# Patient Record
Sex: Female | Born: 1960 | Race: Black or African American | Hispanic: No | Smoking: Former smoker
Health system: Southern US, Community
[De-identification: ages and names within clinical notes are randomized; demographics above are authoritative.]

## PROBLEM LIST (undated history)

## (undated) DIAGNOSIS — D649 Anemia, unspecified: Secondary | ICD-10-CM

## (undated) DIAGNOSIS — J45909 Unspecified asthma, uncomplicated: Secondary | ICD-10-CM

## (undated) DIAGNOSIS — I10 Essential (primary) hypertension: Secondary | ICD-10-CM

## (undated) HISTORY — DX: Essential (primary) hypertension: I10

## (undated) HISTORY — DX: Anemia, unspecified: D64.9

---

## 2002-07-31 ENCOUNTER — Other Ambulatory Visit: Admission: RE | Admit: 2002-07-31 | Discharge: 2002-07-31 | Payer: Self-pay | Admitting: Obstetrics and Gynecology

## 2002-08-01 ENCOUNTER — Other Ambulatory Visit: Admission: RE | Admit: 2002-08-01 | Discharge: 2002-08-01 | Payer: Self-pay | Admitting: Obstetrics and Gynecology

## 2002-10-07 ENCOUNTER — Inpatient Hospital Stay (HOSPITAL_COMMUNITY): Admission: RE | Admit: 2002-10-07 | Discharge: 2002-10-11 | Payer: Self-pay | Admitting: Obstetrics and Gynecology

## 2002-10-07 ENCOUNTER — Encounter (INDEPENDENT_AMBULATORY_CARE_PROVIDER_SITE_OTHER): Payer: Self-pay | Admitting: Specialist

## 2003-02-28 ENCOUNTER — Encounter (INDEPENDENT_AMBULATORY_CARE_PROVIDER_SITE_OTHER): Payer: Self-pay | Admitting: Internal Medicine

## 2003-02-28 LAB — CONVERTED CEMR LAB: Pap Smear: NORMAL

## 2006-01-09 ENCOUNTER — Ambulatory Visit: Payer: Self-pay | Admitting: Internal Medicine

## 2006-03-06 ENCOUNTER — Encounter (INDEPENDENT_AMBULATORY_CARE_PROVIDER_SITE_OTHER): Payer: Self-pay | Admitting: Internal Medicine

## 2006-03-06 DIAGNOSIS — I10 Essential (primary) hypertension: Secondary | ICD-10-CM | POA: Insufficient documentation

## 2007-06-07 ENCOUNTER — Ambulatory Visit: Payer: Self-pay | Admitting: Internal Medicine

## 2007-06-07 DIAGNOSIS — K5900 Constipation, unspecified: Secondary | ICD-10-CM | POA: Insufficient documentation

## 2007-06-07 DIAGNOSIS — D649 Anemia, unspecified: Secondary | ICD-10-CM | POA: Insufficient documentation

## 2007-06-07 DIAGNOSIS — J302 Other seasonal allergic rhinitis: Secondary | ICD-10-CM | POA: Insufficient documentation

## 2007-06-07 DIAGNOSIS — J309 Allergic rhinitis, unspecified: Secondary | ICD-10-CM

## 2007-07-08 ENCOUNTER — Ambulatory Visit: Payer: Self-pay | Admitting: Internal Medicine

## 2007-07-08 DIAGNOSIS — G43909 Migraine, unspecified, not intractable, without status migrainosus: Secondary | ICD-10-CM | POA: Insufficient documentation

## 2007-07-18 ENCOUNTER — Encounter (INDEPENDENT_AMBULATORY_CARE_PROVIDER_SITE_OTHER): Payer: Self-pay | Admitting: Internal Medicine

## 2009-05-17 ENCOUNTER — Ambulatory Visit: Payer: Self-pay | Admitting: Family Medicine

## 2009-05-17 DIAGNOSIS — Z780 Asymptomatic menopausal state: Secondary | ICD-10-CM | POA: Insufficient documentation

## 2009-05-17 DIAGNOSIS — J019 Acute sinusitis, unspecified: Secondary | ICD-10-CM | POA: Insufficient documentation

## 2009-09-15 ENCOUNTER — Encounter: Payer: Self-pay | Admitting: Physician Assistant

## 2009-11-09 ENCOUNTER — Encounter: Payer: Self-pay | Admitting: Physician Assistant

## 2009-11-09 ENCOUNTER — Telehealth: Payer: Self-pay | Admitting: Physician Assistant

## 2009-11-10 ENCOUNTER — Ambulatory Visit: Payer: Self-pay | Admitting: Family Medicine

## 2009-11-10 ENCOUNTER — Encounter: Payer: Self-pay | Admitting: Physician Assistant

## 2009-11-10 DIAGNOSIS — N920 Excessive and frequent menstruation with regular cycle: Secondary | ICD-10-CM | POA: Insufficient documentation

## 2009-11-11 ENCOUNTER — Encounter: Payer: Self-pay | Admitting: Physician Assistant

## 2009-11-12 ENCOUNTER — Encounter: Payer: Self-pay | Admitting: Physician Assistant

## 2009-11-15 ENCOUNTER — Telehealth: Payer: Self-pay | Admitting: Family Medicine

## 2009-11-22 LAB — CONVERTED CEMR LAB
HCT: 33.6 % — ABNORMAL LOW (ref 36.0–46.0)
Iron: 53 ug/dL (ref 42–145)
Platelets: 294 10*3/uL (ref 150–400)
RDW: 15.9 % — ABNORMAL HIGH (ref 11.5–15.5)
WBC: 5.4 10*3/uL (ref 4.0–10.5)

## 2009-11-29 ENCOUNTER — Ambulatory Visit: Payer: Self-pay | Admitting: Family Medicine

## 2010-01-10 ENCOUNTER — Other Ambulatory Visit: Admission: RE | Admit: 2010-01-10 | Discharge: 2010-01-10 | Payer: Self-pay | Admitting: Family Medicine

## 2010-01-10 ENCOUNTER — Ambulatory Visit: Payer: Self-pay | Admitting: Family Medicine

## 2010-01-10 DIAGNOSIS — R5383 Other fatigue: Secondary | ICD-10-CM | POA: Insufficient documentation

## 2010-01-10 DIAGNOSIS — R5381 Other malaise: Secondary | ICD-10-CM | POA: Insufficient documentation

## 2010-01-10 LAB — HM PAP SMEAR

## 2010-01-11 ENCOUNTER — Telehealth (INDEPENDENT_AMBULATORY_CARE_PROVIDER_SITE_OTHER): Payer: Self-pay | Admitting: *Deleted

## 2010-01-16 DIAGNOSIS — E669 Obesity, unspecified: Secondary | ICD-10-CM | POA: Insufficient documentation

## 2010-02-10 ENCOUNTER — Telehealth: Payer: Self-pay | Admitting: Family Medicine

## 2010-03-19 ENCOUNTER — Encounter: Payer: Self-pay | Admitting: Family Medicine

## 2010-03-20 ENCOUNTER — Encounter: Payer: Self-pay | Admitting: Family Medicine

## 2010-03-29 NOTE — Assessment & Plan Note (Signed)
Summary: office visit   Vital Signs:  Patient profile:   50 year old female Height:      65.25 inches Weight:      208.50 pounds BMI:     34.56 O2 Sat:      98 % on Room air Pulse rate:   82 / minute Pulse rhythm:   regular Resp:     16 per minute BP sitting:   124 / 82  (left arm)  Vitals Entered By: Mauricia Area CMA (January 10, 2010 11:30 AM)  Nutrition Counseling: Patient's BMI is greater than 25 and therefore counseled on weight management options.  O2 Flow:  Room air CC: physical  Vision Screening:Left eye w/o correction: 20 / 13 Right Eye w/o correction: 20 / 20 Both eyes w/o correction:  20/ 13        Vision Entered By: Mauricia Area CMA (January 10, 2010 11:31 AM)   CC:  physical.  History of Present Illness: Reports  that  she has been  doing well. Denies recent fever or chills. Denies sinus pressure, nasal congestion , ear pain or sore throat. Denies chest congestion, or cough productive of sputum. Denies chest pain, palpitations, PND, orthopnea or leg swelling. Denies abdominal pain, nausea, vomitting, diarrhea or constipation. Denies change in bowel movements or bloody stool. Denies dysuria , frequency, incontinence or hesitancy. Denies  joint pain, swelling, or reduced mobility. Denies headaches, vertigo, seizures. Denies depression, anxiety or insomnia. Denies  rash, lesions, or itch.     Allergies: 1)  Vicodin  Review of Systems      See HPI Eyes:  Denies blurring, discharge, double vision, eye pain, and red eye. Endo:  Denies cold intolerance, excessive urination, and heat intolerance. Heme:  Denies abnormal bruising and bleeding. Allergy:  Denies hives or rash and itching eyes.  Physical Exam  General:  Well-developed,well-nourished,obese,in no acute distress; alert,appropriate and cooperative throughout examination Head:  Normocephalic and atraumatic without obvious abnormalities. No apparent alopecia or balding. Eyes:  No  corneal or conjunctival inflammation noted. EOMI. Perrla. Funduscopic exam benign, without hemorrhages, exudates or papilledema. Vision grossly normal. Ears:  External ear exam shows no significant lesions or deformities.  Otoscopic examination reveals clear canals, tympanic membranes are intact bilaterally without bulging, retraction, inflammation or discharge. Hearing is grossly normal bilaterally. Nose:  External nasal examination shows no deformity or inflammation. Nasal mucosa are pink and moist without lesions or exudates. Mouth:  Oral mucosa and oropharynx without lesions or exudates.  Teeth in good repair. Neck:  No deformities, masses, or tenderness noted. Chest Wall:  No deformities, masses, or tenderness noted. Breasts:  No mass, nodules, thickening, tenderness, bulging, retraction, inflamation, nipple discharge or skin changes noted.   Lungs:  Normal respiratory effort, chest expands symmetrically. Lungs are clear to auscultation, no crackles or wheezes. Heart:  Normal rate and regular rhythm. S1 and S2 normal without gallop, murmur, click, rub or other extra sounds. Abdomen:  Bowel sounds positive,abdomen soft and non-tender without masses, organomegaly or hernias noted. Rectal:  No external abnormalities noted. Normal sphincter tone. No rectal masses or tenderness. Genitalia:  Normal introitus for age, no external lesions, no vaginal discharge, mucosa pink and moist, no vaginal or cervical lesions, no vaginal atrophy, no friaility or hemorrhage, normal uterus size and position, no adnexal masses or tenderness Msk:  No deformity or scoliosis noted of thoracic or lumbar spine.   Pulses:  R and L carotid,radial,femoral,dorsalis pedis and posterior tibial pulses are full and equal bilaterally Extremities:  No clubbing, cyanosis, edema, or deformity noted with normal full range of motion of all joints.   Neurologic:  No cranial nerve deficits noted. Station and gait are normal. Plantar  reflexes are down-going bilaterally. DTRs are symmetrical throughout. Sensory, motor and coordinative functions appear intact. Skin:  Intact without suspicious lesions or rashes Cervical Nodes:  No lymphadenopathy noted Axillary Nodes:  No palpable lymphadenopathy Inguinal Nodes:  No significant adenopathy Psych:  Cognition and judgment appear intact. Alert and cooperative with normal attention span and concentration. No apparent delusions, illusions, hallucinations   Impression & Recommendations:  Problem # 1:  HYPERTENSION (ICD-401.9) Assessment Unchanged  Her updated medication list for this problem includes:    Amlodipine Besylate 5 Mg Tabs (Amlodipine besylate) .Marland Kitchen... Take 1 daily for blood pressure  Orders: T-Basic Metabolic Panel [phone])  BP today: 124/82 Prior BP: 120/80 (11/29/2009)  Problem # 2:  OBESITY, UNSPECIFIED (ICD-278.00) Assessment: Deteriorated  Ht: 65.25 (01/10/2010)   Wt: 208.50 (01/10/2010)   BMI: 34.56 (01/10/2010)  Complete Medication List: 1)  Amlodipine Besylate 5 Mg Tabs (Amlodipine besylate) .... Take 1 daily for blood pressure 2)  Fluconazole 150 Mg Tabs (Fluconazole) .... Take 1 tablet by mouth once a day  Other Orders: T-Lipid Profile [phone]) T- Hemoglobin A1C (96295-28413) T-TSH [phone]) T-Vitamin B12 (36644-03474) Hemoccult Guaiac-1 spec.(in office) (82270) Pap Smear (25956) Gastroenterology Referral (GI)  Patient Instructions: 1)  Please schedule a follow-up appointment in 3 months. 2)  It is important that you exercise regularly at least 30 minutes 5 times a week. If you develop chest pain, have severe difficulty breathing, or feel very tired , stop exercising immediately and seek medical attention. 3)  You need to lose weight. Consider a lower calorie diet and regular exercise.  4)  BMP prior to visit, ICD-9: 5)  Lipid Panel prior to visit, ICD-9: 6)  TSH prior to visit, ICD-9:  fasting asap 7)  :, B12 8)  HbgA1C  prior to visit, ICD-9 9)  You will be referred for screening colonscopy Prescriptions: FLUCONAZOLE 150 MG TABS (FLUCONAZOLE) Take 1 tablet by mouth once a day  #1 x 0   Entered and Authorized by:   Syliva Overman MD   Signed by:   Syliva Overman MD on 01/12/2010   Method used:   Printed then faxed to ...         RxID:   [id number]    Orders Added: 1)  Est. Patient 40-64 years [99396] 2)  T-Basic Metabolic Panel [phone] 3)  T-Lipid Profile [80061-22930] 4)  T- Hemoglobin A1C [83036-23375] 5)  T-TSH [09323-55732] 6)  T-Vitamin B12 [82607-23330] 7)  Hemoccult Guaiac-1 spec.(in office) [82270] 8)  Pap Smear [88150] 9)  Gastroenterology Referral [GI]

## 2010-03-29 NOTE — Letter (Signed)
Summary: Letter  Letter   Imported By: Lind Guest 09/15/2009 14:40:10  _____________________________________________________________________  External Attachment:    Type:   Image     Comment:   External Document

## 2010-03-29 NOTE — Miscellaneous (Signed)
Summary: Orders Update  Clinical Lists Changes  Orders: Added new Test order of T-CBC No Diff ([id number]) - Signed Added new Test order of T-Iron [phone]) - Signed Added new Test order of T-Pregnancy (Serum), Qual.  [phone]) - Signed

## 2010-03-29 NOTE — Assessment & Plan Note (Signed)
Summary: NEW PATIENT- room 3   Vital Signs:  Patient profile:   50 year old female Height:      65.25 inches Weight:      200 pounds BMI:     33.15 O2 Sat:      98 % on Room air Pulse rate:   76 / minute Resp:     16 per minute BP sitting:   146 / 80  (left arm)  Vitals Entered By: Adella Hare LPN (May 17, 2009 8:24 AM) CC: new patient- room 3 Is Patient Diabetic? No Pain Assessment Patient in pain? no        CC:  new patient- room 3.  History of Present Illness: New pt here today to establish with new PCP.  menses irrg since nov hot flashes & night sweats x approx 1 yr last pap/pelvic couple yrs ago.  hx migraines , no change in syptoms avg 2/mos usually around menses takes 800 mg of ibuprofen as needed & works pretty well.  doesn't like prescription meds.  sinus congestoin x 2 wks discolored mucus, some blood no cough no fever   Current Medications (verified): 1)  None  Allergies (verified): 1)  Vicodin  Past History:  Past medical, surgical, family and social histories (including risk factors) reviewed for relevance to current acute and chronic problems.  Past Medical History: Reviewed history from 06/07/2007 and no changes required. Hypertension constipation Anemia-NOS Allergic rhinitis  Past Surgical History: Reviewed history from 06/07/2007 and no changes required. Caesarean section x2  Family History: Reviewed history from 06/07/2007 and no changes required. father-75 mother-70-HTN sister-40-HTN brother-39 sister-38 son-15-eczema,allergies son-4-asthma  Social History: Reviewed history from 06/07/2007 and no changes required. divorced Former Smoker-2ppd x3 years, quit 1982 Alcohol use-no Drug use-no  Review of Systems General:  Denies chills and fever. ENT:  Complains of nasal congestion, postnasal drainage, and sinus pressure; denies earache and sore throat. CV:  Denies chest pain or discomfort. Resp:  Denies cough and  shortness of breath. GU:  Complains of abnormal vaginal bleeding.  Physical Exam  General:  Well-developed,well-nourished,in no acute distress; alert,appropriate and cooperative throughout examination Head:  Normocephalic and atraumatic without obvious abnormalities. No apparent alopecia or balding. Ears:  External ear exam shows no significant lesions or deformities.  Otoscopic examination reveals clear canals, tympanic membranes are intact bilaterally without bulging, retraction, inflammation or discharge. Hearing is grossly normal bilaterally. Nose:  no external deformity, mucosal erythema, mucosal edema, L maxillary sinus tenderness, and R maxillary sinus tenderness.   Mouth:  Oral mucosa and oropharynx without lesions or exudates.  Neck:  No deformities, masses, or tenderness noted. Lungs:  Normal respiratory effort, chest expands symmetrically. Lungs are clear to auscultation, no crackles or wheezes. Heart:  Normal rate and regular rhythm. S1 and S2 normal without gallop, murmur, click, rub or other extra sounds. Cervical Nodes:  No lymphadenopathy noted Psych:  Cognition and judgment appear intact. Alert and cooperative with normal attention span and concentration. No apparent delusions, illusions, hallucinations   Impression & Recommendations:  Problem # 1:  SINUSITIS, ACUTE (ICD-461.9) Assessment New  The following medications were removed from the medication list:    Nasonex 50 Mcg/act Susp (Mometasone furoate) .Marland Kitchen... 2 sprays in each nostril once daily Her updated medication list for this problem includes:    Amoxicillin 875 Mg Tabs (Amoxicillin) .Marland Kitchen... Take 1 two times a day x 10 days  Problem # 2:  PERIMENOPAUSAL STATUS (ICD-V49.81) Assessment: New Will return for pap/pelvic exam Orders: T-FSH (  [id number])  Problem # 3:  MIGRAINE HEADACHE (ICD-346.90) Assessment: Unchanged pt states her migraines improved when she was on amlidipine prev. pt only like to use ibuprofen  for migraines, takes 800 mg as needed   Problem # 4:  HYPERTENSION (ICD-401.9) Will restart meds.  Pt states worked well for her prev. Her updated medication list for this problem includes:    Amlodipine Besylate 5 Mg Tabs (Amlodipine besylate) .Marland Kitchen... Take 1 daily for blood pressure  Orders: T-Comprehensive Metabolic Panel ([id number])  BP today: 146/80 Prior BP: 136/96 (07/08/2007)  Complete Medication List: 1)  Amoxicillin 875 Mg Tabs (Amoxicillin) .... Take 1 two times a day x 10 days 2)  Amlodipine Besylate 5 Mg Tabs (Amlodipine besylate) .... Take 1 daily for blood pressure  Other Orders: T-Lipid Profile (32440-10272) T-CBC No Diff (53664-40347) T-TSH [phone]) T-Iron [phone]) T-Iron Binding Capacity (TIBC) ([address]) T-Vitamin D (25-Hydroxy) (60109-32355) Mammogram (Screening) (Mammo)  Patient Instructions: 1)  Please schedule a follow-up appointment in 1 month for physical/pap. 2)  You may try Black Cohash (Remifemin) for hot flashes. 3)  I have refilled your blood pressure pill & prescribed an antibiotic for you. 4)  It is important that you exercise regularly at least 20 minutes 5 times a week. If you develop chest pain, have severe difficulty breathing, or feel very tired , stop exercising immediately and seek medical attention. 5)  You need to lose weight. Consider a lower calorie diet and regular exercise.  6)  Get plenty of rest, drink lots of clear liquids, and use Tylenol or Ibuprofen for fever and comfort. Return in 7-10 days if you're not better:sooner if you're feeling worse. Prescriptions: AMOXICILLIN 875 MG TABS (AMOXICILLIN) take 1 two times a day x 10 days  #20 x 0   Entered by:   Everitt Amber LPN   Authorized by:   Esperanza Sheets PA   Signed by:   Everitt Amber LPN on 73/22/0254   Method used:   Electronically to        [facility] (retail)       [address]       [place], [place]  [address]       Ph: [phone]        Fax: [phone]   RxID:   [id number] AMLODIPINE BESYLATE 5 MG TABS (AMLODIPINE BESYLATE) take 1 daily for blood pressure  #90 x 3   Entered and Authorized by:   Esperanza Sheets PA   Signed by:   Esperanza Sheets PA on 05/17/2009   Method used:   Electronically to        [facility] (retail)       [address]       [place], [place]  [address]       Ph: [phone]       Fax: [phone]   RxID:   [id number] AMOXICILLIN 875 MG TABS (AMOXICILLIN) take 1 two times a day x 10 days  #20 x 0   Entered and Authorized by:   Esperanza Sheets PA   Signed by:   Esperanza Sheets PA on 05/17/2009   Method used:   Electronically to        [facility] [address] (retail)       [address]       [place],  [place]  [address]       Ph: [phone] or [phone]       Fax: [phone]   RxID:   [id number]

## 2010-03-29 NOTE — Progress Notes (Signed)
Summary: CALL  Phone Note Call from Patient   Summary of Call: CALL BACK AT [phone] WANTS RESULTS LEFT MESSAGE Initial call taken by: Lind Guest,  November 15, 2009 9:50 AM  Follow-up for Phone Call        returned call, no answer Follow-up by: Adella Hare LPN,  November 15, 2009 10:10 AM  Additional Follow-up for Phone Call Additional follow up Details #1::        returned call, no answer Additional Follow-up by: Adella Hare LPN,  November 16, 2009 8:37 AM

## 2010-03-29 NOTE — Assessment & Plan Note (Signed)
Summary: follow up- room 1   Vital Signs:  Patient profile:   50 year old female Height:      65.25 inches Weight:      204.75 pounds BMI:     33.93 O2 Sat:      100 % on Room air Pulse rate:   63 / minute Resp:     16 per minute BP sitting:   120 / 80  (left arm)  Vitals Entered By: Adella Hare LPN (November 29, 2009 1:17 PM) CC: follow-up visit Is Patient Diabetic? No Pain Assessment Patient in pain? no      Comments declined physical due to menstral   CC:  follow-up visit.  History of Present Illness: Pt presents today for check up. Is feeling well and no complaints. Started normal mense yesterday. Last mos had a heavy period and took OCPs to control the bleeding and worked well.  Has started taking Fe for her anemia.   Hx of htn. Taking medications as prescribed and denies side effects.  No headache, chest pain or palpitations..  No prev mamm.  Will reschedule PE/pap. Last Td  uncertain. Will get flu vac at work.    Current Medications (verified): 1)  Amlodipine Besylate 5 Mg Tabs (Amlodipine Besylate) .... Take 1 Daily For Blood Pressure 2)  Ortho-Cyclen (28) 0.25-35 Mg-Mcg Tabs (Norgestimate-Eth Estradiol) .... Take As Directed  Allergies (verified): 1)  Vicodin  Past History:  Past medical history reviewed for relevance to current acute and chronic problems.  Past Medical History: Reviewed history from 06/07/2007 and no changes required. Hypertension constipation Anemia-NOS Allergic rhinitis  Review of Systems General:  Denies chills and fever. ENT:  Complains of postnasal drainage; denies earache, sinus pressure, and sore throat. CV:  Denies chest pain or discomfort, lightheadness, and palpitations. Resp:  Denies cough and shortness of breath. GU:  Denies abnormal vaginal bleeding. Neuro:  Denies headaches. Allergy:  Complains of seasonal allergies.  Physical Exam  General:  Well-developed,well-nourished,in no acute distress;  alert,appropriate and cooperative throughout examination Head:  Normocephalic and atraumatic without obvious abnormalities. No apparent alopecia or balding. Ears:  External ear exam shows no significant lesions or deformities.  Otoscopic examination reveals clear canals, tympanic membranes are intact bilaterally without bulging, retraction, inflammation or discharge. Hearing is grossly normal bilaterally. Nose:  External nasal examination shows no deformity or inflammation. Nasal mucosa are pink and moist without lesions or exudates. Mouth:  Oral mucosa and oropharynx without lesions or exudates.   Neck:  No deformities, masses, or tenderness noted. Lungs:  Normal respiratory effort, chest expands symmetrically. Lungs are clear to auscultation, no crackles or wheezes. Heart:  Normal rate and regular rhythm. S1 and S2 normal without gallop, murmur, click, rub or other extra sounds. Cervical Nodes:  No lymphadenopathy noted Psych:  Cognition and judgment appear intact. Alert and cooperative with normal attention span and concentration. No apparent delusions, illusions, hallucinations   Impression & Recommendations:  Problem # 1:  HYPERTENSION (ICD-401.9) Assessment Comment Only  Her updated medication list for this problem includes:    Amlodipine Besylate 5 Mg Tabs (Amlodipine besylate) .Marland Kitchen... Take 1 daily for blood pressure  Orders: T-Comprehensive Metabolic Panel ([id number])  BP today: 120/80 Prior BP: 122/90 (11/10/2009)  Problem # 2:  MENORRHAGIA (ICD-626.2) Assessment: Improved  The following medications were removed from the medication list:    Ortho-cyclen (28) 0.25-35 Mg-mcg Tabs (Norgestimate-eth estradiol) .Marland Kitchen... Take as directed  Orders: T-TSH (25956-38756) T-CBC No Diff ([id number]) T-Iron (16606-30160)  Problem #  3:  ALLERGIC RHINITIS (ICD-477.9) Assessment: Unchanged Pt declined prescription.  Problem # 4:  ANEMIA (ICD-285.9) Assessment: Comment Only  Complete  Medication List: 1)  Amlodipine Besylate 5 Mg Tabs (Amlodipine besylate) .... Take 1 daily for blood pressure  Other Orders: T-Lipid Profile ([id number]) Mammogram (Screening) (Mammo) Tdap => 69yrs IM (30865) Admin 1st Vaccine (78469)  Patient Instructions: 1)  Schedule Physcial/Pap appt in 6 weeks. 2)  I have ordered a mammogram and blood work for you. 3)  You have received a tetnus vaccine today. 4)  Continue your current medications. 5)  It is important that you exercise regularly at least 20 minutes 5 times a week. If you develop chest pain, have severe difficulty breathing, or feel very tired , stop exercising immediately and seek medical attention. 6)  You need to lose weight. Consider a lower calorie diet and regular exercise.    Immunizations Administered:  Tetanus Vaccine:    Vaccine Type: Tdap    Site: left deltoid    Mfr: novartis    Dose: 0.5 ml    Route: IM    Given by: Adella Hare LPN    Exp. Date: 12/17/2011    Lot #: [id number]    VIS given: 01/15/08 version given November 29, 2009.

## 2010-03-29 NOTE — Progress Notes (Signed)
Summary: please advise  Phone Note Call from Patient   Summary of Call: Patient called in and states she knows she is over due for her physical, she said that she states she missed 4 months of her period but she started having a period on the 29 of August, and she is still bleeding she states it is a steady blood flow, and that she feels weak.  She wants to know if this is normal and she does plan on making an appointment for her physical.  Please advise you can leave a message and she will get back to you,  [phone]. Initial call taken by: Curtis Sites,  November 09, 2009 11:33 AM  Follow-up for Phone Call        She needs an appt for evaluation today or tomorrow (not a physical, that can be done later).  She needs labs drawn today. Follow-up by: Esperanza Sheets PA,  November 09, 2009 11:41 AM  Additional Follow-up for Phone Call Additional follow up Details #1::        I left a msg for patient to return my call. Additional Follow-up by: Curtis Sites,  November 09, 2009 2:25 PM    Additional Follow-up for Phone Call Additional follow up Details #2::    called patient, left message Follow-up by: Adella Hare LPN,  November 10, 2009 9:53 AM  Additional Follow-up for Phone Call Additional follow up Details #3:: Details for Additional Follow-up Action Taken: PATIENT COMING IN ODAY AT 3:00  Additional Follow-up by: Lind Guest,  November 10, 2009 11:38 AM

## 2010-03-29 NOTE — Progress Notes (Signed)
Summary: Dr  Patty Sermons appt.  Phone Note Outgoing Call   Summary of Call: patient has an appt. with Dr. Karilyn Cota on Dec. 1, 2011, they have called the patient to let them aware of appt.  Initial call taken by: Curtis Sites,  January 11, 2010 4:46 PM

## 2010-03-29 NOTE — Assessment & Plan Note (Signed)
Summary: irregular bleeding- room 2   Vital Signs:  Patient profile:   50 year old female Height:      65.25 inches Weight:      206.25 pounds BMI:     34.18 O2 Sat:      100 % on Room air Pulse rate:   76 / minute Resp:     16 per minute BP sitting:   122 / 90  (left arm)  Vitals Entered By: Adella Hare LPN (November 10, 2009 3:13 PM) CC: irregular bleeding   CC:  irregular bleeding.  History of Present Illness: Pt states she started a menstrual period Aug 29th.  Had been 4 mos since LMP.  Menses prior to this regular.  c/o heavy flow, changing protection 6-7 times a day.  1 or 2 blood clots only during this time.  No pelvic pain.  + hot flashes x 3 mos. Hx of tubal ligation. c/o feeling tired and weak.  Hx of anemia.  Takes Multivit with Fe daily.  Hx of htn. Taking medications as prescribed and denies side effects.  No headache, chest pain or palpitations.     Allergies (verified): 1)  Vicodin  Past History:  Past medical history reviewed for relevance to current acute and chronic problems.  Past Medical History: Reviewed history from 06/07/2007 and no changes required. Hypertension constipation Anemia-NOS Allergic rhinitis  Review of Systems General:  Complains of fatigue; denies chills and fever. CV:  Denies chest pain or discomfort and palpitations. Resp:  Denies shortness of breath. GI:  Denies abdominal pain, nausea, and vomiting. GU:  Complains of abnormal vaginal bleeding; denies dysuria and urinary frequency.  Physical Exam  General:  Well-developed,well-nourished,in no acute distress; alert,appropriate and cooperative throughout examination Head:  Normocephalic and atraumatic without obvious abnormalities. No apparent alopecia or balding. Eyes:  conjunctiva pale Ears:  External ear exam shows no significant lesions or deformities.  Otoscopic examination reveals clear canals, tympanic membranes are intact bilaterally without bulging, retraction,  inflammation or discharge. Hearing is grossly normal bilaterally. Nose:  External nasal examination shows no deformity or inflammation. Nasal mucosa are pink and moist without lesions or exudates. Mouth:  Oral mucosa and oropharynx without lesions or exudates.   Neck:  No deformities, masses, or tenderness noted.no thyromegaly and no thyroid nodules or tenderness.   Lungs:  Normal respiratory effort, chest expands symmetrically. Lungs are clear to auscultation, no crackles or wheezes. Heart:  Normal rate and regular rhythm. S1 and S2 normal without gallop, murmur, click, rub or other extra sounds. Abdomen:  Bowel sounds positive,abdomen soft and non-tender without masses, organomegaly or hernias noted. Cervical Nodes:  No lymphadenopathy noted Psych:  Cognition and judgment appear intact. Alert and cooperative with normal attention span and concentration. No apparent delusions, illusions, hallucinations   Impression & Recommendations:  Problem # 1:  MENORRHAGIA (ICD-626.2) Assessment New  Her updated medication list for this problem includes:    Ortho-cyclen (28) 0.25-35 Mg-mcg Tabs (Norgestimate-eth estradiol) .Marland Kitchen... Take as directed  Orders: Pelvic Ultrasound (Sono Pelvis)  Problem # 2:  PERIMENOPAUSAL STATUS (ICD-V49.81) Assessment: Comment Only  Problem # 3:  HYPERTENSION (ICD-401.9) Assessment: Comment Only  Her updated medication list for this problem includes:    Amlodipine Besylate 5 Mg Tabs (Amlodipine besylate) .Marland Kitchen... Take 1 daily for blood pressure  Problem # 4:  ANEMIA-NOS (ICD-285.9) Assessment: Comment Only  Complete Medication List: 1)  Amlodipine Besylate 5 Mg Tabs (Amlodipine besylate) .... Take 1 daily for blood pressure 2)  Ortho-cyclen (  28) 0.25-35 Mg-mcg Tabs (Norgestimate-eth estradiol) .... Take as directed  Patient Instructions: 1)  Please schedule a follow-up appointment in 2 weeks. 2)  I have ordered a pelvic ultrasound. 3)  I have prescribed birth  control pill to stop your bleeding.  Take 3 tabs a day x 3 days, then 2 tabs a day x 3 days, then 1 daily thereafter. 4)  Have labs drawn today. Prescriptions: ORTHO-CYCLEN (28) 0.25-35 MG-MCG TABS (NORGESTIMATE-ETH ESTRADIOL) take as directed  #28d x 0   Entered and Authorized by:   Esperanza Sheets PA   Signed by:   Esperanza Sheets PA on 11/10/2009   Method used:   Electronically to        [facility] (retail)       [address]       [place], [place]  [address]       Ph: [phone]       Fax: [phone]   RxID:   [id number]

## 2010-03-29 NOTE — Letter (Signed)
Summary: Letter  Letter   Imported By: Lind Guest 11/12/2009 13:53:17  _____________________________________________________________________  External Attachment:    Type:   Image     Comment:   External Document

## 2010-03-29 NOTE — Letter (Signed)
Summary: Work Excuse  [facility]  [address]   [place], [place] [address]   Phone: [phone]  Fax: [phone]    Today's Date: November 10, 2009  Name of Patient: Laurie Olson  The above named patient had a medical visit today.  Please take this into consideration when reviewing the time away from work/school.    Special Instructions:  [ * ] None  [  ] To be off the remainder of today, returning to the normal work / school schedule tomorrow.  [  ] To be off until the next scheduled appointment on ______________________.  [  ] Other ________________________________________________________________ ________________________________________________________________________   Sincerely yours,   Esperanza Sheets, PA-C

## 2010-03-31 NOTE — Progress Notes (Signed)
Summary: BIRTH CONTROL  Phone Note Call from Patient   Summary of Call: HAVING PROBLEM WITH CYCLE WANTED TO KNOW COULD YOU CALL IN SAME BIRTH CONTROL PILLS AS BEFORE...PLEASE CALL PATIENT BACK ON CELL [phone] Initial call taken by: Eugenio Hoes,  February 10, 2010 8:50 AM  Follow-up for Phone Call        pls stamp and fax scriopt and let her know med sent in , will need ov in 5 months if none to discuss menstrual probs and need for continued oCP Follow-up by: Syliva Overman MD,  February 10, 2010 11:49 AM  Additional Follow-up for Phone Call Additional follow up Details #1::        Phone Call Completed, Rx Called In Additional Follow-up by: Adella Hare LPN,  February 11, 2010 4:39 PM    New/Updated Medications: ORTHO-GYNOL 1 % GEL (OCTOXYNOL)  ORTHO-CYCLEN (28) 0.25-35 MG-MCG TABS (NORGESTIMATE-ETH ESTRADIOL) Use as directed Prescriptions: ORTHO-CYCLEN (28) 0.25-35 MG-MCG TABS (NORGESTIMATE-ETH ESTRADIOL) Use as directed  #1 x 4   Entered and Authorized by:   Syliva Overman MD   Signed by:   Syliva Overman MD on 02/10/2010   Method used:   Printed then faxed to ...       [facility] (retail)       [address]       [place], [place]  [address]       Ph: [phone]       Fax: [phone]   RxID:   [id number]

## 2010-04-06 ENCOUNTER — Encounter: Payer: Self-pay | Admitting: Family Medicine

## 2010-04-13 ENCOUNTER — Ambulatory Visit: Payer: Self-pay | Admitting: Family Medicine

## 2010-04-14 NOTE — Letter (Signed)
Summary: TO RESCHEDULE APPOINMENT  TO RESCHEDULE APPOINMENT   Imported By: Lind Guest 04/06/2010 11:10:33  _____________________________________________________________________  External Attachment:    Type:   Image     Comment:   External Document

## 2010-05-09 ENCOUNTER — Ambulatory Visit: Payer: Self-pay | Admitting: Family Medicine

## 2010-05-10 ENCOUNTER — Encounter: Payer: Self-pay | Admitting: Family Medicine

## 2010-05-17 NOTE — Letter (Signed)
Summary: 1st no show letter  1st no show letter   Imported By: Lind Guest 05/10/2010 09:12:01  _____________________________________________________________________  External Attachment:    Type:   Image     Comment:   External Document

## 2010-06-13 ENCOUNTER — Other Ambulatory Visit: Payer: Self-pay | Admitting: *Deleted

## 2010-06-13 MED ORDER — AMLODIPINE BESYLATE 5 MG PO TABS: 5.0000 mg | ORAL_TABLET | Freq: Every day | ORAL | Status: DC

## 2010-07-15 NOTE — Op Note (Signed)
NAME:  Laurie Olson, Laurie Olson                          ACCOUNT NO.:  [id number]   MEDICAL RECORD NO.:  [id number]                   PATIENT TYPE:  INP   LOCATION:  9399                                 FACILITY:  [facility]   PHYSICIAN:  Carrington Clamp, M.D.              DATE OF BIRTH:  1961/01/05   DATE OF PROCEDURE:  10/07/2002  DATE OF DISCHARGE:                                 OPERATIVE REPORT   PREOPERATIVE DIAGNOSES:  1. Term pregnancy with chronic hypertension and previous cesarean section,     desires repeat.  2. Multiparous, desires permanent sterility.   POSTOPERATIVE DIAGNOSES:  1. Term pregnancy with chronic hypertension and previous cesarean section,     desires repeat.  2. Multiparous, desires permanent sterility.   PROCEDURES:  1. Low transverse cesarean section.  2. Bilateral tubal ligation.   ATTENDING:  Carrington Clamp, M.D.   ASSISTANT:  Luvenia Redden, M.D.   ANESTHESIA:  Spinal.   ESTIMATED BLOOD LOSS:  600 mL.   FLUIDS REPLACED:  3000 mL.   URINE OUTPUT:  350 mL.   COMPLICATIONS:  None.   FINDINGS:  A female infant in the vertex presentation, Apgars 9 and 9.  Normal  tubes, ovaries, and uterus were seen.   MEDICATIONS:  Cefotan and Pitocin.   PATHOLOGY:  None.   COUNTS:  Correct x3.   TECHNIQUE:  After adequate spinal anesthesia was achieved, the patient was  prepped and draped in the usual sterile fashion in the dorsal supine  position with leftward tilt.  A Pfannenstiel skin incision was made with a  scalpel, carried down to the fascia with the Bovie cautery.  The fascia was  incised in the midline with a scalpel and carried in a transverse  curvilinear manner with the Mayo scissors.  The fascia was reflected from  the rectus muscles superiorly and inferiorly with blunt and sharp dissection  and the rectus muscles split in the midline with the Mayo scissors.  A bowel-  free portion of the peritoneum was entered into upon carefully dissecting  out  the rectus muscles.  The peritoneum was then incised in a superior and  inferior manner with good visualization of the bowel and bladder.   The bladder blade was placed and the vesicouterine fascia was tented up and  incised in a transverse curvilinear manner.  Blunt dissection was used to  create the bladder flap, and the bladder blade was replaced.  A 2 cm  transverse incision was made in the upper portion of the lower uterine  segment until the amnion was noted.  The incision was then extended in a  transverse curvilinear manner with the bandage scissors and the amnion  ruptured with the Allis.  Clear fluid was noted and the baby was delivered  without complication.  There was a body cord.  The baby was bulb-suctioned  and the cord was clamped and cut and the  baby was handed to awaiting  pediatrics.   The placenta was then manually extracted and the uterus exteriorized,  wrapped in a wet lap, and cleared of all debris.  The uterine incision was  then closed with a running locked stitch of 0 Monocryl.  One figure-of-eight  stitch was used to ensure hemostasis.  Attention was then turned to the  tubes.  Each was tented up with a Babcock and the avascular portion of the  mesosalpinx entered into with the Bovie cautery.  Two free-hand ties of  plain gut were placed through this and then tied down to create an  intervening knuckle of approximately 2 cm.  A segment of approximately 1.5  cm long was then excised bilaterally with the Metzenbaum scissors.  Hemostasis was achieved in all four incision sites.   The uterine incision was reinspected and found to be hemostatic.  The uterus  was replaced in the abdomen and the gutters cleared of all debris with  irrigation.  The uterine incision was reinspected and found to be  hemostatic, as was the left tubal ligation site, which had been introduced  into the abdomen first.  The peritoneum was then closed with a running  suture of 2-0 Vicryl.   The fascia was closed with a running stitch of 0  Vicryl.  The subcutaneous tissue was rendered hemostatic with the Bovie  cautery and irrigation and then closed with interrupted 2-0 plain gut  stitches.  The skin was closed with staples.  The patient tolerated the  procedure well, was returned to the recovery room in stable condition.                                               Carrington Clamp, M.D.    MH/MEDQ  D:  10/07/2002  T:  10/07/2002  Job:  [id number]

## 2010-07-15 NOTE — Discharge Summary (Signed)
   NAME:  Laurie Olson, Laurie Olson                          ACCOUNT NO.:  [id number]   MEDICAL RECORD NO.:  [id number]                   PATIENT TYPE:  INP   LOCATION:  9324                                 FACILITY:  [facility]   PHYSICIAN:  Miguel Aschoff, M.D.                    DATE OF BIRTH:  06/07/1960   DATE OF ADMISSION:  10/07/2002  DATE OF DISCHARGE:  10/11/2002                                 DISCHARGE SUMMARY   ADMISSION DIAGNOSES:  1. Intrauterine pregnancy at term.  2. Previous cesarean section.  3. Chronic hypertension.  4. Desire for a repeat cesarean section.  5. Desire for sterilization.   POST-OP DIAGNOSES:  1. Intrauterine pregnancy at term.  2. Previous cesarean section.  3. Chronic hypertension.  4. Desire for a repeat cesarean section.  5. Desire for sterilization.  6. Delivery of viable female infant, Apgar's 9, 9.   OPERATIONS/PROCEDURES:  1. Repeat low flap transverse cesarean section.  2. Bilateral tubal sterilization.   BRIEF HISTORY:  The patient is a 49 year old black female, gravida 2, para  1, status post prior cesarean section for failure to progress.  The patient  had her prenatal care at [facility] and has desire to undergo a  repeat cesarean section rather than attempt a VBAC.  In addition, the  patient has requested that a sterilization procedure be performed at the  time of the C-section.   HOSPITAL COURSE:  Preoperative studies were  obtained.  Her admission  hemoglobin was 10.4, hematocrit 30.7, white count 8,000.   Under spinal anesthesia, repeat low flap transverse cesarean section was  carried out without difficulty as well as bilateral tubal sterilization.  Path report confirms the presence of two segments of tube.   The patient's postoperative course was essentially uncomplicated.  She  tolerated increasing ambulation and diet well.  By October 11, 2002, she was  able to be sent home in satisfactory condition.  Hemoglobin on discharge was  9.2.   DISCHARGE MEDICATIONS:  1. Tylox, one every three hours as needed for pain.  2. Chromagen one daily.   She was instructed no heavy lifting, place nothing in the vagina and to call  if there are any problems such as fever, pain or heavy bleeding.    CONDITION ON DISCHARGE:  Improved.   The baby did have to spend some time in neonatal ICU because of respiratory  difficulties.                                               Miguel Aschoff, M.D.    AR/MEDQ  D:  11/04/2002  T:  11/04/2002  Job:  [id number]

## 2010-08-22 ENCOUNTER — Ambulatory Visit (INDEPENDENT_AMBULATORY_CARE_PROVIDER_SITE_OTHER): Payer: BC Managed Care – PPO | Admitting: Family Medicine

## 2010-08-22 ENCOUNTER — Encounter: Payer: Self-pay | Admitting: Family Medicine

## 2010-08-22 ENCOUNTER — Encounter: Payer: Self-pay | Admitting: *Deleted

## 2010-08-22 VITALS — BP 160/110 | HR 72 | Resp 16 | Ht 66.0 in | Wt 211.1 lb

## 2010-08-22 DIAGNOSIS — Z13820 Encounter for screening for osteoporosis: Secondary | ICD-10-CM

## 2010-08-22 DIAGNOSIS — R5383 Other fatigue: Secondary | ICD-10-CM | POA: Insufficient documentation

## 2010-08-22 DIAGNOSIS — N390 Urinary tract infection, site not specified: Secondary | ICD-10-CM

## 2010-08-22 DIAGNOSIS — I10 Essential (primary) hypertension: Secondary | ICD-10-CM

## 2010-08-22 DIAGNOSIS — Z13220 Encounter for screening for lipoid disorders: Secondary | ICD-10-CM

## 2010-08-22 DIAGNOSIS — R5381 Other malaise: Secondary | ICD-10-CM

## 2010-08-22 DIAGNOSIS — D649 Anemia, unspecified: Secondary | ICD-10-CM

## 2010-08-22 LAB — POCT URINALYSIS DIPSTICK
Bilirubin, UA: NEGATIVE
Ketones, UA: NEGATIVE
pH, UA: 5.5

## 2010-08-22 MED ORDER — CIPROFLOXACIN HCL 500 MG PO TABS: 500.0000 mg | ORAL_TABLET | Freq: Two times a day (BID) | ORAL | Status: AC

## 2010-08-22 MED ORDER — AMLODIPINE BESYLATE 10 MG PO TABS: 10.0000 mg | ORAL_TABLET | Freq: Every day | ORAL | Status: DC

## 2010-08-22 MED ORDER — TRIAMTERENE-HCTZ 37.5-25 MG PO CAPS: ORAL_CAPSULE | ORAL | Status: DC

## 2010-08-22 NOTE — Progress Notes (Signed)
  Subjective:    Patient ID: Purcell Mouton, female    DOB: [birth year], 50 y.o.   MRN: [id number]  HPI 4 week h/o excessive fatigue and daytime sleepiness. Feelss lightheaded at times. Excessive snoring . Recently changed from night to day work.Feels drained, denies fever or chills.Denies headache or localized weakness or numbness    Review of Systems Denies recent fever or chills. Denies sinus pressure, nasal congestion, ear pain or sore throat. Denies chest congestion, productive cough or wheezing. Denies chest pains, palpitations, paroxysmal nocturnal dyspnea, orthopnea and leg swelling Denies abdominal pain, nausea, vomiting,diarrhea or constipation.  Denies rectal bleeding or change in bowel movement. Denies dysuria, frequency, hesitancy or incontinence. Denies joint pain, swelling and limitation in mobility. Denies headaches, seizure, numbness, or tingling. Denies depression, anxiety or insomnia. Denies skin break down or rash.        Objective:   Physical Exam Patient alert and oriented and in no Cardiopulmonary distress.  HEENT: No facial asymmetry, EOMI, no sinus tenderness, TM's clear, Oropharynx pink and moist.  Neck supple no adenopathy.  Chest: Clear to auscultation bilaterally.  CVS: S1, S2 no murmurs, no S3.  ABD: Soft non tender. Bowel sounds normal.  Ext: No edema  MS: Adequate ROM spine, shoulders, hips and knees.  Skin: Intact, no ulcerations or rash noted.  Psych: Good eye contact, normal affect. Memory intact not anxious or depressed appearing.  CNS: CN 2-12 intact, power, tone and sensation normal throughout.        Assessment & Plan:

## 2010-08-22 NOTE — Patient Instructions (Signed)
Return in 4 days, this Friday for recheck on your blood pressure .  F/u with MD in 4 to 6 weeks.  Work excuse from today to return 08/29/2010  EKG, CXR and urinalysis today.  Fasting labs past due pls do tomorrow.  You will be referred for a sleep study.  Pls practice good sleep hygiene and take benadryl 25 mg (OTC) one at bedtime  Your blood pressure is too high, new dose of amlodipine, 10mg  one daily start tomorrow. Starting today take triamterene 25 mg one half tablet every day

## 2010-08-23 LAB — BASIC METABOLIC PANEL
CO2: 27 mEq/L (ref 19–32)
Calcium: 10.8 mg/dL — ABNORMAL HIGH (ref 8.4–10.5)
Creat: 0.8 mg/dL (ref 0.50–1.10)
Glucose, Bld: 89 mg/dL (ref 70–99)

## 2010-08-23 LAB — HEPATIC FUNCTION PANEL
AST: 19 U/L (ref 0–37)
Albumin: 4.3 g/dL (ref 3.5–5.2)
Alkaline Phosphatase: 68 U/L (ref 39–117)
Total Bilirubin: 0.8 mg/dL (ref 0.3–1.2)

## 2010-08-23 LAB — CBC WITH DIFFERENTIAL/PLATELET
Basophils Absolute: 0.1 10*3/uL (ref 0.0–0.1)
Eosinophils Absolute: 0.1 10*3/uL (ref 0.0–0.7)
Eosinophils Relative: 3 % (ref 0–5)
HCT: 39.1 % (ref 36.0–46.0)
Lymphocytes Relative: 44 % (ref 12–46)
Lymphs Abs: 1.7 10*3/uL (ref 0.7–4.0)
MCH: 27.1 pg (ref 26.0–34.0)
MCV: 86.9 fL (ref 78.0–100.0)
Monocytes Absolute: 0.3 10*3/uL (ref 0.1–1.0)
Platelets: 249 10*3/uL (ref 150–400)
RDW: 14.8 % (ref 11.5–15.5)
WBC: 3.8 10*3/uL — ABNORMAL LOW (ref 4.0–10.5)

## 2010-08-23 LAB — LIPID PANEL
Cholesterol: 248 mg/dL — ABNORMAL HIGH (ref 0–200)
HDL: 61 mg/dL (ref >39)

## 2010-08-24 NOTE — Assessment & Plan Note (Addendum)
Deteriorated,excessive daytime sleepiness and snoring , refer for sleep study

## 2010-08-24 NOTE — Assessment & Plan Note (Signed)
Medication compliance addressed. Commitment to regular exercise and healthy  food choices, with portion control discussed. DASH diet and low fat diet discussed and literature offered. Changes in medication made at this visit. Work excuse until re-evaluated in 4 days

## 2010-08-25 ENCOUNTER — Encounter: Payer: Self-pay | Admitting: Family Medicine

## 2010-08-26 ENCOUNTER — Ambulatory Visit (INDEPENDENT_AMBULATORY_CARE_PROVIDER_SITE_OTHER): Payer: BC Managed Care – PPO | Admitting: Family Medicine

## 2010-08-26 VITALS — BP 130/92 | HR 72 | Resp 16 | Wt 209.1 lb

## 2010-08-26 DIAGNOSIS — E785 Hyperlipidemia, unspecified: Secondary | ICD-10-CM

## 2010-08-26 DIAGNOSIS — I10 Essential (primary) hypertension: Secondary | ICD-10-CM

## 2010-08-26 DIAGNOSIS — E669 Obesity, unspecified: Secondary | ICD-10-CM

## 2010-08-26 LAB — VITAMIN D 1,25 DIHYDROXY: Vitamin D2 1, 25 (OH)2: <8 pg/mL

## 2010-08-26 LAB — URINE CULTURE

## 2010-08-26 MED ORDER — PRAVASTATIN SODIUM 40 MG PO TABS: 40.0000 mg | ORAL_TABLET | Freq: Every evening | ORAL | Status: DC

## 2010-08-26 NOTE — Patient Instructions (Addendum)
F/u as nefore.  New med for high cholesterol, pls follow low fat diet,  Blood pressure much improved

## 2010-08-27 ENCOUNTER — Encounter: Payer: Self-pay | Admitting: Family Medicine

## 2010-08-27 NOTE — Assessment & Plan Note (Signed)
New diagnosis, low fat diet info provided, also pt to start medication

## 2010-08-27 NOTE — Assessment & Plan Note (Signed)
Improved , no change in med, lifestyle modification to continue

## 2010-08-27 NOTE — Assessment & Plan Note (Signed)
Unchanged. Patient re-educated about  the importance of commitment to a  minimum of 150 minutes of exercise per week. The importance of healthy food choices with portion control discussed. Encouraged to start a food diary, count calories and to consider  joining a support group. Sample diet sheets offered. Goals set by the patient for the next several months.    

## 2010-08-27 NOTE — Progress Notes (Signed)
  Subjective:    Patient ID: Laurie Olson, female    DOB: 1960/04/01, 50 y.o.   MRN: [id number]  HPI Pt in for review ofher blood pressure, having been taken out of ork , as well as reent labs. She states she feels much better than she did several days ago. Incidentally it was discovered that she di have a UTI also for which she is on the correct medication. She denies any adverse s/e from her medication, no light headedness, palpitations, or chest pain or leg swelling. Recent labs show signifiant hyperlipidemia, which will need medication   Review of Systems Denies recent fever or chills. Denies sinus pressure, nasal congestion, ear pain or sore throat. Denies chest congestion, productive cough or wheezing. Denies chest pains, palpitations, paroxysmal nocturnal dyspnea, orthopnea and leg swelling Denies abdominal pain, nausea, vomiting,diarrhea or constipation.  Denies dysuria, frequency, hesitancy or incontinence. Denies joint pain, swelling and limitation in mobility.       Objective:   Physical Exam Patient alert and oriented and in no Cardiopulmonary distress.  HEENT: No facial asymmetry, EOMI, Chest: Clear to auscultation bilaterally.  CVS: S1, S2 no murmurs, no S3.    Ext: No edema  MS: Adequate ROM spine, shoulders, hips and knees.  Skin: Intact, no ulcerations or rash noted.  Psych: Good eye contact, normal affect. Memory intact not anxious or depressed appearing.  CNS: CN 2-12 intact, power, tone and sensation normal throughout.        Assessment & Plan:

## 2010-09-01 ENCOUNTER — Encounter: Payer: Self-pay | Admitting: Family Medicine

## 2010-09-07 ENCOUNTER — Telehealth: Payer: Self-pay | Admitting: Family Medicine

## 2010-09-13 NOTE — Telephone Encounter (Signed)
noted 

## 2010-10-10 ENCOUNTER — Ambulatory Visit: Payer: BC Managed Care – PPO | Admitting: Family Medicine

## 2011-06-05 ENCOUNTER — Encounter: Payer: Self-pay | Admitting: Family Medicine

## 2011-06-05 ENCOUNTER — Other Ambulatory Visit: Payer: Self-pay | Admitting: Family Medicine

## 2011-06-05 ENCOUNTER — Ambulatory Visit (INDEPENDENT_AMBULATORY_CARE_PROVIDER_SITE_OTHER): Payer: BC Managed Care – PPO | Admitting: Family Medicine

## 2011-06-05 VITALS — BP 148/110 | HR 103 | Resp 18 | Ht 66.0 in | Wt 209.1 lb

## 2011-06-05 DIAGNOSIS — Z139 Encounter for screening, unspecified: Secondary | ICD-10-CM

## 2011-06-05 DIAGNOSIS — F329 Major depressive disorder, single episode, unspecified: Secondary | ICD-10-CM

## 2011-06-05 DIAGNOSIS — F32A Depression, unspecified: Secondary | ICD-10-CM | POA: Insufficient documentation

## 2011-06-05 DIAGNOSIS — E785 Hyperlipidemia, unspecified: Secondary | ICD-10-CM

## 2011-06-05 DIAGNOSIS — I10 Essential (primary) hypertension: Secondary | ICD-10-CM

## 2011-06-05 MED ORDER — TRIAMTERENE-HCTZ 37.5-25 MG PO TABS: 1.0000 | ORAL_TABLET | Freq: Every day | ORAL | Status: DC

## 2011-06-05 NOTE — Progress Notes (Signed)
  Subjective:    Patient ID: Laurie Olson, female    DOB: [birth year], 51 y.o.   MRN: [id number]  HPI Hospital f/u from [facility], March 20 to 21, 2013, due to uncontrolled htn, states she had been out  Of her medication for approximately 3 days prior to admission and had therefore been non compliant. She presented with chest pain, acute MI was ruled out, she was also mildly hypokalemic on admission. Lipid profile was excellent. Discharge SBP reportedly from summary 122 to 135, and DBP between 77 and 83 Pt c/o significant stress and depression, feels overwhelmed and confused as far as her marriage and it's future are concerned. Not suicidal or homicidal, no hallucinations, however easily tearful, unable to concentrate, agrees to get therapy to help with this   Review of Systems See HPI Denies recent fever or chills. Denies sinus pressure, nasal congestion, ear pain or sore throat. Denies chest congestion, productive cough or wheezing. Denies chest pains, palpitations and leg swelling Denies abdominal pain, nausea, vomiting,diarrhea or constipation.   Denies dysuria, frequency, hesitancy or incontinence. Denies joint pain, swelling and limitation in mobility. Denies headaches, seizures, numbness, or tingling. . Denies skin break down or rash.        Objective:   Physical Exam  Patient alert and oriented and in no cardiopulmonary distress.  HEENT: No facial asymmetry, EOMI, no sinus tenderness,  oropharynx pink and moist.  Neck supple no adenopathy.  Chest: Clear to auscultation bilaterally.  CVS: S1, S2 no murmurs, no S3.  ABD: Soft non tender. Bowel sounds normal.  Ext: No edema  MS: Adequate ROM spine, shoulders, hips and knees.  Skin: Intact, no ulcerations or rash noted.  Psych: Good eye contact, tearful affect. Memory intact  anxious and  depressed appearing.  CNS: CN 2-12 intact, power, tone and sensation normal throughout.       Assessment & Plan:

## 2011-06-05 NOTE — Patient Instructions (Signed)
CPE in 5 to 6 weeks.  You will be referred for a mammogram as well as for counselling for depression.  Blood pressure is high increase the triamterene tablet to ONE daily, continue amlodipine as before.  Please bring medication to next visit

## 2011-06-05 NOTE — Assessment & Plan Note (Signed)
Uncontrolled increase maxzide

## 2011-06-07 ENCOUNTER — Telehealth: Payer: Self-pay | Admitting: Family Medicine

## 2011-06-07 NOTE — Telephone Encounter (Signed)
Will send to PCP 

## 2011-06-09 NOTE — Telephone Encounter (Signed)
noted 

## 2011-06-12 ENCOUNTER — Ambulatory Visit (HOSPITAL_COMMUNITY)
Admission: RE | Admit: 2011-06-12 | Discharge: 2011-06-12 | Disposition: A | Discharge status: Home / Self Care | Payer: BC Managed Care – PPO | Source: Ambulatory Visit | Attending: Family Medicine | Admitting: Family Medicine

## 2011-06-12 DIAGNOSIS — Z1231 Encounter for screening mammogram for malignant neoplasm of breast: Secondary | ICD-10-CM | POA: Insufficient documentation

## 2011-06-12 DIAGNOSIS — Z139 Encounter for screening, unspecified: Secondary | ICD-10-CM

## 2011-06-19 NOTE — Assessment & Plan Note (Signed)
Moderately severe, pt to receive psychotherapy. No indication that she needs medication at this time , and is certainly no harm to herself or anyone

## 2011-06-19 NOTE — Assessment & Plan Note (Signed)
Controlled, no change in medication  

## 2011-07-10 ENCOUNTER — Encounter: Payer: Self-pay | Admitting: Family Medicine

## 2011-07-10 ENCOUNTER — Ambulatory Visit (INDEPENDENT_AMBULATORY_CARE_PROVIDER_SITE_OTHER): Payer: BC Managed Care – PPO | Admitting: Family Medicine

## 2011-07-10 ENCOUNTER — Other Ambulatory Visit (HOSPITAL_COMMUNITY)
Admission: RE | Admit: 2011-07-10 | Discharge: 2011-07-10 | Disposition: A | Discharge status: Home / Self Care | Payer: BC Managed Care – PPO | Source: Ambulatory Visit | Attending: Family Medicine | Admitting: Family Medicine

## 2011-07-10 VITALS — BP 124/72 | HR 65 | Resp 18 | Ht 66.0 in | Wt 209.0 lb

## 2011-07-10 DIAGNOSIS — N760 Acute vaginitis: Secondary | ICD-10-CM | POA: Insufficient documentation

## 2011-07-10 DIAGNOSIS — J309 Allergic rhinitis, unspecified: Secondary | ICD-10-CM

## 2011-07-10 DIAGNOSIS — I10 Essential (primary) hypertension: Secondary | ICD-10-CM

## 2011-07-10 DIAGNOSIS — Z01419 Encounter for gynecological examination (general) (routine) without abnormal findings: Secondary | ICD-10-CM | POA: Insufficient documentation

## 2011-07-10 DIAGNOSIS — E785 Hyperlipidemia, unspecified: Secondary | ICD-10-CM

## 2011-07-10 DIAGNOSIS — M546 Pain in thoracic spine: Secondary | ICD-10-CM

## 2011-07-10 DIAGNOSIS — R5381 Other malaise: Secondary | ICD-10-CM

## 2011-07-10 DIAGNOSIS — Z1211 Encounter for screening for malignant neoplasm of colon: Secondary | ICD-10-CM

## 2011-07-10 DIAGNOSIS — J019 Acute sinusitis, unspecified: Secondary | ICD-10-CM | POA: Insufficient documentation

## 2011-07-10 DIAGNOSIS — R5383 Other fatigue: Secondary | ICD-10-CM

## 2011-07-10 DIAGNOSIS — Z0000 Encounter for general adult medical examination without abnormal findings: Secondary | ICD-10-CM

## 2011-07-10 DIAGNOSIS — Z780 Asymptomatic menopausal state: Secondary | ICD-10-CM

## 2011-07-10 LAB — POC HEMOCCULT BLD/STL (OFFICE/1-CARD/DIAGNOSTIC): Fecal Occult Blood, POC: POSITIVE

## 2011-07-10 MED ORDER — FLUTICASONE PROPIONATE 50 MCG/ACT NA SUSP: 2.0000 | Freq: Every day | NASAL | Status: DC

## 2011-07-10 MED ORDER — VENLAFAXINE HCL ER 37.5 MG PO CP24: 37.5000 mg | ORAL_CAPSULE | Freq: Every day | ORAL | Status: DC

## 2011-07-10 MED ORDER — METRONIDAZOLE 500 MG PO TABS: 500.0000 mg | ORAL_TABLET | Freq: Two times a day (BID) | ORAL | Status: AC

## 2011-07-10 MED ORDER — KETOROLAC TROMETHAMINE 60 MG/2ML IJ SOLN
60.0000 mg | Freq: Once | INTRAMUSCULAR | Status: AC
  Administered 2011-07-10: 60 mg via INTRAMUSCULAR

## 2011-07-10 MED ORDER — PREDNISONE (PAK) 5 MG PO TABS: 5.0000 mg | ORAL_TABLET | ORAL | Status: DC

## 2011-07-10 MED ORDER — IBUPROFEN 800 MG PO TABS: 800.0000 mg | ORAL_TABLET | Freq: Three times a day (TID) | ORAL | Status: AC | PRN

## 2011-07-10 MED ORDER — LORATADINE 10 MG PO TABS: 10.0000 mg | ORAL_TABLET | Freq: Every day | ORAL | Status: DC

## 2011-07-10 MED ORDER — CYCLOBENZAPRINE HCL 10 MG PO TABS: ORAL_TABLET | ORAL | Status: DC

## 2011-07-10 MED ORDER — METHYLPREDNISOLONE ACETATE 80 MG/ML IJ SUSP
80.0000 mg | Freq: Once | INTRAMUSCULAR | Status: AC
  Administered 2011-07-10: 80 mg via INTRAMUSCULAR

## 2011-07-10 MED ORDER — FLUCONAZOLE 150 MG PO TABS: ORAL_TABLET | ORAL | Status: AC

## 2011-07-10 NOTE — Patient Instructions (Signed)
F/u early September.  You will get injections for upper back and neck pain in office,  Medication is sent in for pain, allergies , and sinus infection, also hot flashes.  Fasting lipid, cmp, HBa1C and TSH end August BEFORE you return.  Weight loss goal of 3 pounds per month, and follow low fat diet.  You are referred for screening colonoscopy and to physical therapy

## 2011-07-10 NOTE — Progress Notes (Signed)
  Subjective:    Patient ID: Laurie Olson, female    DOB: Aug 12, 1960, 51 y.o.   MRN: [id number]  HPI The PT is here for annual exam and re-evaluation of chronic medical conditions, medication management and review of any available recent lab and radiology data.  Preventive health is updated, specifically  Cancer screening and Immunization.   Questions or concerns regarding consultations or procedures which the PT has had in the interim are  addressed. The PT denies any adverse reactions to current medications since the last visit.  C/o increased and uncontrolled neck and upper back pain, uses upper body and squats on the job, wants any help available for symptom relief. C/o uncontrolled night sweats, no cycle since 03/2010 C/o malodorous vaginal discharge and currently douches. C/o sinus pressure and left ear pain with yellow nasal discharge, few chills intermittently thjs past weekend      Review of Systems See HPI Denies recent fever or chills.  Denies chest congestion, productive cough or wheezing. Denies chest pains, palpitations and leg swelling Denies abdominal pain, nausea, vomiting,diarrhea or constipation.   Denies dysuria, frequency, hesitancy or incontinence.  Denies headaches, seizures, numbness, or tingling. Denies depression, anxiety or insomnia. Denies skin break down or rash.        Objective:   Physical Exam Pleasant well nourished female, alert and oriented x 3, in no cardio-pulmonary distress. Afebrile. HEENT No facial trauma or asymetry. Sinuses non tender.  EOMI, PERTL, fundoscopic exam  no hemorhage or exudate.  External ears normal, tympanic membranes clear. Oropharynx moist, no exudate, fair dentition. Neck: supple, no adenopathy,JVD or thyromegaly.No bruits.  Chest: Clear to ascultation bilaterally.No crackles or wheezes. Non tender to palpation  Breast: No asymetry,no masses. No nipple discharge or inversion. No axillary or supraclavicular  adenopathy  Cardiovascular system; Heart sounds normal,  S1 and  S2 ,no S3.  No murmur, or thrill. Apical beat not displaced Peripheral pulses normal.  Abdomen: Soft, non tender, no organomegaly or masses. No bruits. Bowel sounds normal. No guarding, tenderness or rebound.  Rectal:  No mass. Guaiac positive  Stool.(blody vag d/c from trauma)  GU: External genitalia normal. No lesions. Vaginal canal normal.malodorous vaginal discharge. Uterus normal size, no adnexal masses, no cervical motion or adnexal tenderness.  Musculoskeletal exam: Decreased ROM neck and thracic spine with spasm, adequate ROM hips , shoulders and knees. No deformity ,swelling or crepitus noted. No muscle wasting or atrophy.   Neurologic: Cranial nerves 2 to 12 intact. Power, tone ,sensation and reflexes normal throughout. No disturbance in gait. No tremor.  Skin: Intact, no ulceration, erythema , scaling or rash noted. Pigmentation normal throughout  Psych; Normal mood and affect. Judgement and concentration normal        Assessment & Plan:

## 2011-07-10 NOTE — Assessment & Plan Note (Signed)
toradol 60 mg and depomedrol 80 mg IM in office refer for therapy and med prescribed

## 2011-07-10 NOTE — Assessment & Plan Note (Signed)
Antibiotic prescribed 

## 2011-07-10 NOTE — Assessment & Plan Note (Signed)
Reports uncontrolled hot flashes, start effexor, also lifestyle changes discussed

## 2011-07-10 NOTE — Assessment & Plan Note (Signed)
Specimens sent, pt on flagyll for sinus infection

## 2011-07-10 NOTE — Assessment & Plan Note (Signed)
Controlled, no change in medication  

## 2011-07-10 NOTE — Assessment & Plan Note (Signed)
Hyperlipidemia:Low fat diet discussed and encouraged.  Updated lab in 3 month

## 2011-07-26 ENCOUNTER — Telehealth: Payer: Self-pay

## 2011-07-26 NOTE — Telephone Encounter (Signed)
LMOM to call.

## 2011-08-11 NOTE — Telephone Encounter (Signed)
LMOM to call.

## 2011-08-14 NOTE — Telephone Encounter (Signed)
noted 

## 2011-08-14 NOTE — Telephone Encounter (Signed)
I have mailed the patient a 2nd letter. She has not returned my phone calls to get scheduled for colonoscopy.

## 2011-08-18 ENCOUNTER — Other Ambulatory Visit: Payer: Self-pay | Admitting: Family Medicine

## 2011-08-23 ENCOUNTER — Other Ambulatory Visit: Payer: Self-pay | Admitting: Family Medicine

## 2011-09-12 ENCOUNTER — Other Ambulatory Visit: Payer: Self-pay | Admitting: Family Medicine

## 2011-09-28 ENCOUNTER — Other Ambulatory Visit: Payer: Self-pay | Admitting: Family Medicine

## 2011-11-06 ENCOUNTER — Encounter: Payer: Self-pay | Admitting: Family Medicine

## 2011-11-06 ENCOUNTER — Ambulatory Visit (INDEPENDENT_AMBULATORY_CARE_PROVIDER_SITE_OTHER): Payer: BC Managed Care – PPO | Admitting: Family Medicine

## 2011-11-06 VITALS — BP 112/90 | HR 80 | Resp 15 | Ht 66.0 in | Wt 206.4 lb

## 2011-11-06 DIAGNOSIS — Z23 Encounter for immunization: Secondary | ICD-10-CM

## 2011-11-06 DIAGNOSIS — M899 Disorder of bone, unspecified: Secondary | ICD-10-CM

## 2011-11-06 DIAGNOSIS — R5381 Other malaise: Secondary | ICD-10-CM

## 2011-11-06 DIAGNOSIS — R5383 Other fatigue: Secondary | ICD-10-CM

## 2011-11-06 DIAGNOSIS — M5090 Cervical disc disorder, unspecified, unspecified cervical region: Secondary | ICD-10-CM | POA: Insufficient documentation

## 2011-11-06 DIAGNOSIS — Z1211 Encounter for screening for malignant neoplasm of colon: Secondary | ICD-10-CM

## 2011-11-06 DIAGNOSIS — I10 Essential (primary) hypertension: Secondary | ICD-10-CM

## 2011-11-06 DIAGNOSIS — E669 Obesity, unspecified: Secondary | ICD-10-CM

## 2011-11-06 DIAGNOSIS — E785 Hyperlipidemia, unspecified: Secondary | ICD-10-CM

## 2011-11-06 MED ORDER — METHYLPREDNISOLONE ACETATE 80 MG/ML IJ SUSP
80.0000 mg | Freq: Once | INTRAMUSCULAR | Status: AC
  Administered 2011-11-06: 80 mg via INTRAMUSCULAR

## 2011-11-06 MED ORDER — DIAZEPAM 5 MG PO TABS: ORAL_TABLET | ORAL | Status: DC

## 2011-11-06 MED ORDER — TRIAMTERENE-HCTZ 37.5-25 MG PO TABS: ORAL_TABLET | ORAL | Status: DC

## 2011-11-06 MED ORDER — KETOROLAC TROMETHAMINE 60 MG/2ML IM SOLN
60.0000 mg | Freq: Once | INTRAMUSCULAR | Status: AC
  Administered 2011-11-06: 60 mg via INTRAMUSCULAR

## 2011-11-06 MED ORDER — PREDNISONE (PAK) 5 MG PO TABS: 5.0000 mg | ORAL_TABLET | ORAL | Status: DC

## 2011-11-06 MED ORDER — IBUPROFEN 800 MG PO TABS: 800.0000 mg | ORAL_TABLET | Freq: Three times a day (TID) | ORAL | Status: AC | PRN

## 2011-11-06 NOTE — Progress Notes (Signed)
  Subjective:    Patient ID: Laurie Olson, female    DOB: [birth year], 51 y.o.   MRN: [id number]  HPI Pt in with 3 month h/o increased neck pain over a 10, with weakness and numbness in right upper extremity, having problems writing and holding things, worse in the past 3 weeks, a lot of right neck spasm which limits neck mobility Also has been experiencing mid and low back pain,  Rated at an 8 daily also, occasionally  has pain in right calf, back pain is daily also but no clear radiation, no lower extremity weakness or numbness, no incontinence of stool or urine   Review of Systems See HPI Denies recent fever or chills. Denies sinus pressure, nasal congestion, ear pain or sore throat. Denies chest congestion, productive cough or wheezing. Denies chest pains, palpitations and leg swelling Denies abdominal pain, nausea, vomiting,diarrhea or constipation.   Denies dysuria, frequency, hesitancy or incontinence. Denies headaches, seizures, numbness, or tingling. Denies depression, anxiety or insomnia. Denies skin break down or rash.        Objective:   Physical Exam  Patient alert and oriented and in no cardiopulmonary distress.  HEENT: No facial asymmetry, EOMI, no sinus tenderness,  oropharynx pink and moist.  Neck decreased rOM neck with right trapezius spasm no adenopathy.  Chest: Clear to auscultation bilaterally.  CVS: S1, S2 no murmurs, no S3.  ABD: Soft non tender. Bowel sounds normal.  Ext: No edema  MS: decreased  ROM spine,grade 4 power in right upper extremity, and decreased sensation, grip in right hand is a 3 Skin: Intact, no ulcerations or rash noted.  Psych: Good eye contact, normal affect. Memory intact not anxious or depressed appearing.  CNS: CN 2-12 intact       Assessment & Plan:

## 2011-11-06 NOTE — Patient Instructions (Addendum)
F/u in 4.5 month, please call if you need me before  You are being treated  for neck pain with possible disc disease.   You are referred for an MRI of your neck, we will call with appt.  You are referred for colonoscopy, past due and you need it.dr  Patty Sermons office will call  Flu vaccine today  It is important that you exercise regularly at least 30 minutes 5 times a week. If you develop chest pain, have severe difficulty breathing, or feel very tired, stop exercising immediately and seek medical attention   A healthy diet is rich in fruit, vegetables and whole grains. Poultry fish, nuts and beans are a healthy choice for protein rather then red meat. A low sodium diet and drinking 64 ounces of water daily is generally recommended. Oils and sweet should be limited. Carbohydrates especially for those who are diabetic or overweight, should be limited to 30-45 gram per meal. It is important to eat on a regular schedule, at least 3 times daily. Snacks should be primarily fruits, vegetables or nuts.   toradol 60mg  and depomedrol 80mg   iM in the office, medication also sent in. Stop flexeril, use valium instead. On weekend oK , when not working or driving oK to take valium up to every 8 hours for excessive spasm, only this week  Fasting labs are past due, please get labwork the first week in November. Cbc, cmp, lipid, HBA1C and TSH and VitD

## 2011-11-07 ENCOUNTER — Encounter (INDEPENDENT_AMBULATORY_CARE_PROVIDER_SITE_OTHER): Payer: Self-pay | Admitting: *Deleted

## 2011-11-13 NOTE — Assessment & Plan Note (Signed)
Disabling neck and upper extremity pain with weakness and numbness. Aggressive steroid course and imaging

## 2011-11-13 NOTE — Assessment & Plan Note (Signed)
Hyperlipidemia:Low fat diet discussed and encouraged. `updated  Labs needed

## 2011-11-13 NOTE — Assessment & Plan Note (Signed)
Improved. Pt applauded on succesful weight loss through lifestyle change, and encouraged to continue same. Weight loss goal set for the next several months.  

## 2011-11-13 NOTE — Assessment & Plan Note (Signed)
Controlled, no change in medication DASH diet and commitment to daily physical activity for a minimum of 30 minutes discussed and encouraged, as a part of hypertension management. The importance of attaining a healthy weight is also discussed.  

## 2011-11-16 ENCOUNTER — Other Ambulatory Visit (HOSPITAL_COMMUNITY): Payer: BC Managed Care – PPO

## 2011-11-20 ENCOUNTER — Telehealth: Payer: Self-pay | Admitting: Family Medicine

## 2011-12-01 NOTE — Telephone Encounter (Signed)
Called and left message for pt to return call.  

## 2012-01-12 ENCOUNTER — Telehealth: Payer: Self-pay | Admitting: Family Medicine

## 2012-01-15 ENCOUNTER — Telehealth: Payer: Self-pay | Admitting: Family Medicine

## 2012-01-15 MED ORDER — TRIAMTERENE-HCTZ 37.5-25 MG PO TABS: ORAL_TABLET | ORAL | Status: DC

## 2012-01-15 NOTE — Telephone Encounter (Signed)
Refill sent in

## 2012-01-17 ENCOUNTER — Other Ambulatory Visit: Payer: Self-pay | Admitting: Family Medicine

## 2012-01-17 NOTE — Telephone Encounter (Signed)
Med reordered.

## 2012-01-19 ENCOUNTER — Telehealth: Payer: Self-pay | Admitting: Family Medicine

## 2012-01-27 ENCOUNTER — Other Ambulatory Visit: Payer: Self-pay | Admitting: Family Medicine

## 2012-01-31 ENCOUNTER — Other Ambulatory Visit: Payer: Self-pay | Admitting: Family Medicine

## 2012-03-13 ENCOUNTER — Encounter: Payer: Self-pay | Admitting: Family Medicine

## 2012-03-13 ENCOUNTER — Ambulatory Visit (INDEPENDENT_AMBULATORY_CARE_PROVIDER_SITE_OTHER): Payer: BC Managed Care – PPO | Admitting: Family Medicine

## 2012-03-13 VITALS — BP 122/84 | HR 83 | Temp 100.2°F | Resp 16 | Ht 66.0 in | Wt 210.0 lb

## 2012-03-13 DIAGNOSIS — R5381 Other malaise: Secondary | ICD-10-CM

## 2012-03-13 DIAGNOSIS — J111 Influenza due to unidentified influenza virus with other respiratory manifestations: Secondary | ICD-10-CM | POA: Insufficient documentation

## 2012-03-13 DIAGNOSIS — R5383 Other fatigue: Secondary | ICD-10-CM

## 2012-03-13 DIAGNOSIS — R7301 Impaired fasting glucose: Secondary | ICD-10-CM

## 2012-03-13 DIAGNOSIS — E785 Hyperlipidemia, unspecified: Secondary | ICD-10-CM

## 2012-03-13 DIAGNOSIS — I10 Essential (primary) hypertension: Secondary | ICD-10-CM

## 2012-03-13 MED ORDER — BENZONATATE 100 MG PO CAPS: 100.0000 mg | ORAL_CAPSULE | Freq: Four times a day (QID) | ORAL | Status: DC | PRN

## 2012-03-13 MED ORDER — OSELTAMIVIR PHOSPHATE 75 MG PO CAPS: 75.0000 mg | ORAL_CAPSULE | Freq: Two times a day (BID) | ORAL | Status: DC

## 2012-03-13 MED ORDER — PROMETHAZINE-DM 6.25-15 MG/5ML PO SYRP: ORAL_SOLUTION | ORAL | Status: AC

## 2012-03-13 MED ORDER — AZITHROMYCIN 250 MG PO TABS: ORAL_TABLET | ORAL | Status: AC

## 2012-03-13 NOTE — Progress Notes (Signed)
  Subjective:    Patient ID: Laurie Olson, female    DOB: [birth year], 52 y.o.   MRN: [id number]  HPI Generalized aches, chills, low grade temp and fatigue, started last night at work, states many people at work with similar symptoms Ethmoid pressure with yellowish drainage, sore throat and cough , dry hacky , excessive, causing chest pain Still has not had colonoscopy, but intends to do so, labs are also past due   Review of Systems See HPI Denies chest pains, palpitations and leg swelling Denies abdominal pain, nausea, vomiting,diarrhea or constipation.   Denies dysuria, frequency, hesitancy or incontinence.  Denies headaches, seizures, numbness, or tingling. Denies depression, anxiety or insomnia. Denies skin break down or rash.        Objective:   Physical Exam Patient alert and oriented and in no cardiopulmonary distress.Ill appearing  HEENT: No facial asymmetry, EOMI, no sinus tenderness,  oropharynx pink and moist.  Neck decreased though adeqaute ROM,tender adenitis  Chest: Adequate air entry, scattered crackles, no wheezes CVS: S1, S2 no murmurs, no S3.  ABD: Soft non tender. Bowel sounds normal.  Ext: No edema  MS: Adequate ROM spine, shoulders, hips and knees.  Skin: Intact, no ulcerations or rash noted.  Psych: Good eye contact, normal affect. Memory intact not anxious or depressed appearing.  CNS: CN 2-12 intact, power, tone and sensation normal throughout.        Assessment & Plan:

## 2012-03-13 NOTE — Patient Instructions (Addendum)
F/u in 4 month  You are being treated for influenza and acute  Bronchitis  Tylenol , 2 tabs in office for body aches and chest pain.  Four medications are sent to your pharmacy, as discussed Work excuse from today to return 03/16/2012  Please get a CXR today  PLEASE get fasting labs within 1 week, you have had no labs since 2012. This is not safe , you have chronic health problems which need to be monitored CBC, fasting lipid, cmp , HBA1C and TSH

## 2012-03-17 DIAGNOSIS — J111 Influenza due to unidentified influenza virus with other respiratory manifestations: Secondary | ICD-10-CM | POA: Insufficient documentation

## 2012-03-17 NOTE — Assessment & Plan Note (Signed)
Acute illness, decongestant and antibiotic prescribed, also work excuse written

## 2012-03-17 NOTE — Assessment & Plan Note (Signed)
Anti viral medication prescribed, and work excuse written Pt ed re rest , fluids , antivirals and symptomatic meds done

## 2012-03-17 NOTE — Assessment & Plan Note (Signed)
Controlled, no change in medication DASH diet and commitment to daily physical activity for a minimum of 30 minutes discussed and encouraged, as a part of hypertension management. The importance of attaining a healthy weight is also discussed.  

## 2012-03-18 ENCOUNTER — Encounter (HOSPITAL_COMMUNITY): Payer: Self-pay

## 2012-03-18 ENCOUNTER — Ambulatory Visit (INDEPENDENT_AMBULATORY_CARE_PROVIDER_SITE_OTHER): Payer: BC Managed Care – PPO | Admitting: Family Medicine

## 2012-03-18 ENCOUNTER — Ambulatory Visit (HOSPITAL_COMMUNITY)
Admission: RE | Admit: 2012-03-18 | Discharge: 2012-03-18 | Disposition: A | Discharge status: Home / Self Care | Payer: BC Managed Care – PPO | Source: Ambulatory Visit | Attending: Family Medicine | Admitting: Family Medicine

## 2012-03-18 VITALS — BP 116/78 | HR 67 | Resp 18 | Ht 66.0 in | Wt 209.0 lb

## 2012-03-18 DIAGNOSIS — R059 Cough, unspecified: Secondary | ICD-10-CM | POA: Insufficient documentation

## 2012-03-18 DIAGNOSIS — R509 Fever, unspecified: Secondary | ICD-10-CM | POA: Insufficient documentation

## 2012-03-18 DIAGNOSIS — J111 Influenza due to unidentified influenza virus with other respiratory manifestations: Secondary | ICD-10-CM

## 2012-03-18 DIAGNOSIS — I10 Essential (primary) hypertension: Secondary | ICD-10-CM

## 2012-03-18 DIAGNOSIS — R05 Cough: Secondary | ICD-10-CM | POA: Insufficient documentation

## 2012-04-07 NOTE — Assessment & Plan Note (Signed)
Controlled, no change in medication  

## 2012-04-07 NOTE — Progress Notes (Signed)
  Subjective:    Patient ID: Laurie Olson, female    DOB: 24-Dec-1960, 52 y.o.   MRN: [id number]  HPI Pt inadvertently returned to office too soon, appt was therefore cancelled, she will f/u at a later date   Review of Systems     Objective:   Physical Exam        Assessment & Plan:

## 2012-05-07 ENCOUNTER — Encounter: Payer: BC Managed Care – PPO | Admitting: Family Medicine

## 2012-05-08 ENCOUNTER — Encounter: Payer: Self-pay | Admitting: Family Medicine

## 2012-05-30 ENCOUNTER — Ambulatory Visit (INDEPENDENT_AMBULATORY_CARE_PROVIDER_SITE_OTHER): Payer: BC Managed Care – PPO | Admitting: Family Medicine

## 2012-05-30 ENCOUNTER — Encounter: Payer: Self-pay | Admitting: Family Medicine

## 2012-05-30 VITALS — BP 140/100 | HR 88 | Resp 18 | Ht 66.0 in | Wt 210.1 lb

## 2012-05-30 DIAGNOSIS — E785 Hyperlipidemia, unspecified: Secondary | ICD-10-CM

## 2012-05-30 DIAGNOSIS — R5383 Other fatigue: Secondary | ICD-10-CM

## 2012-05-30 DIAGNOSIS — J309 Allergic rhinitis, unspecified: Secondary | ICD-10-CM

## 2012-05-30 DIAGNOSIS — D649 Anemia, unspecified: Secondary | ICD-10-CM

## 2012-05-30 DIAGNOSIS — E669 Obesity, unspecified: Secondary | ICD-10-CM

## 2012-05-30 DIAGNOSIS — I10 Essential (primary) hypertension: Secondary | ICD-10-CM

## 2012-05-30 DIAGNOSIS — E559 Vitamin D deficiency, unspecified: Secondary | ICD-10-CM

## 2012-05-30 DIAGNOSIS — Z1211 Encounter for screening for malignant neoplasm of colon: Secondary | ICD-10-CM

## 2012-05-30 DIAGNOSIS — R7301 Impaired fasting glucose: Secondary | ICD-10-CM

## 2012-05-30 DIAGNOSIS — R5381 Other malaise: Secondary | ICD-10-CM

## 2012-05-30 LAB — CBC WITH DIFFERENTIAL/PLATELET
Hemoglobin: 14.9 g/dL (ref 12.0–15.0)
Lymphocytes Relative: 39 % (ref 12–46)
Lymphs Abs: 1.8 10*3/uL (ref 0.7–4.0)
MCH: 30.3 pg (ref 26.0–34.0)
Monocytes Relative: 10 % (ref 3–12)
Neutro Abs: 2.2 10*3/uL (ref 1.7–7.7)
Neutrophils Relative %: 47 % (ref 43–77)
Platelets: 220 10*3/uL (ref 150–400)
RBC: 4.91 MIL/uL (ref 3.87–5.11)
WBC: 4.6 10*3/uL (ref 4.0–10.5)

## 2012-05-30 MED ORDER — TRIAMTERENE-HCTZ 75-50 MG PO TABS: 1.0000 | ORAL_TABLET | Freq: Every day | ORAL | Status: DC

## 2012-05-30 MED ORDER — LORATADINE 10 MG PO TABS: 10.0000 mg | ORAL_TABLET | Freq: Every day | ORAL | Status: DC

## 2012-05-30 NOTE — Patient Instructions (Addendum)
CPE and pap May 15 or after.  Blood pressure is high.  Increase in dose of maxzide to 50mg  one daily, oK to take TWO 25 mg tablets daily.  Continue amlodipine as before  Please schedule your mammogram, this is due.  You are referred to Dr Karilyn Cota for average risk screening colonoscopy  It is important that you exercise regularly at least 30 minutes 5 times a week. If you develop chest pain, have severe difficulty breathing, or feel very tired, stop exercising immediately and seek medical attention   Weight loss goal of 3 to 4 pounds per month  Labs today cbc, cmp lipid, hBA1C, TSH and Vit D  Loratidine will be refilled for your allergies, continue daily flonase

## 2012-05-30 NOTE — Assessment & Plan Note (Signed)
Uncontrolled, increase maxzide dose. DASH diet and commitment to daily physical activity for a minimum of 30 minutes discussed and encouraged, as a part of hypertension management. The importance of attaining a healthy weight is also discussed.

## 2012-05-30 NOTE — Assessment & Plan Note (Signed)
Hyperlipidemia:Low fat diet discussed and encouraged.  Updated lab today 

## 2012-05-30 NOTE — Assessment & Plan Note (Signed)
Increased symptoms with change in season, add loratidine, continue flonase

## 2012-05-30 NOTE — Progress Notes (Signed)
  Subjective:    Patient ID: Laurie Olson, female    DOB: 1960-03-27, 52 y.o.   MRN: [id number]  HPI The PT is here for follow up and re-evaluation of chronic medical conditions, medication management and review of any available recent lab and radiology data.Labs are past due she will have them today. She is required by her job to have this visit, which also includes labs so this is her main reason for coming in I have spent some time education her as to the need for regular visits and blood work based on her chronic disease panel Preventive health is updated, specifically  Cancer screening and Immunization.  noe agrees to have colonoscopy and is referred . The PT denies any adverse reactions to current medications since the last visit.  There are no new concerns. Has joined the [employer] to work on weight loss There are no specific complaints       Review of Systems See HPI Denies recent fever or chills. Denies sinus pressure, nasal congestion, ear pain or sore throat. Denies chest congestion, productive cough or wheezing. Denies chest pains, palpitations and leg swelling Denies abdominal pain, nausea, vomiting,diarrhea or constipation.   Denies dysuria, frequency, hesitancy or incontinence. Denies joint pain, swelling and limitation in mobility. Denies headaches, seizures, numbness, or tingling. Denies depression, anxiety or insomnia. Denies skin break down or rash.        Objective:   Physical Exam Patient alert and oriented and in no cardiopulmonary distress.  HEENT: No facial asymmetry, EOMI, no sinus tenderness,  oropharynx pink and moist.  Neck supple no adenopathy.  Chest: Clear to auscultation bilaterally.  CVS: S1, S2 no murmurs, no S3.  ABD: Soft non tender. Bowel sounds normal.  Ext: No edema  MS: Adequate ROM spine, shoulders, hips and knees.  Skin: Intact, no ulcerations or rash noted.  Psych: Good eye contact, normal affect. Memory intact not anxious or  depressed appearing.  CNS: CN 2-12 intact, power, tone and sensation normal throughout.        Assessment & Plan:

## 2012-05-30 NOTE — Assessment & Plan Note (Signed)
Deteriorated. Patient re-educated about  the importance of commitment to a  minimum of 150 minutes of exercise per week. The importance of healthy food choices with portion control discussed. Encouraged to start a food diary, count calories and to consider  joining a support group. Sample diet sheets offered. Goals set by the patient for the next several months.    

## 2012-05-31 ENCOUNTER — Other Ambulatory Visit: Payer: Self-pay | Admitting: Family Medicine

## 2012-05-31 ENCOUNTER — Encounter (INDEPENDENT_AMBULATORY_CARE_PROVIDER_SITE_OTHER): Payer: Self-pay | Admitting: *Deleted

## 2012-05-31 LAB — COMPREHENSIVE METABOLIC PANEL
ALT: 22 U/L (ref 0–35)
Albumin: 4.6 g/dL (ref 3.5–5.2)
CO2: 31 mEq/L (ref 19–32)
Chloride: 100 mEq/L (ref 96–112)
Glucose, Bld: 87 mg/dL (ref 70–99)
Potassium: 3.5 mEq/L (ref 3.5–5.3)
Sodium: 136 mEq/L (ref 135–145)
Total Protein: 7.8 g/dL (ref 6.0–8.3)

## 2012-05-31 LAB — LIPID PANEL
Cholesterol: 203 mg/dL — ABNORMAL HIGH (ref 0–200)
Triglycerides: 99 mg/dL (ref <150)
VLDL: 20 mg/dL (ref 0–40)

## 2012-05-31 LAB — HEMOGLOBIN A1C
Hgb A1c MFr Bld: 5.9 % — ABNORMAL HIGH (ref <5.7)
Mean Plasma Glucose: 123 mg/dL — ABNORMAL HIGH (ref <117)

## 2012-06-03 ENCOUNTER — Telehealth: Payer: Self-pay | Admitting: Family Medicine

## 2012-06-04 ENCOUNTER — Other Ambulatory Visit: Payer: Self-pay

## 2012-06-04 MED ORDER — ERGOCALCIFEROL 1.25 MG (50000 UT) PO CAPS: 50000.0000 [IU] | ORAL_CAPSULE | ORAL | Status: DC

## 2012-06-04 NOTE — Telephone Encounter (Signed)
Called and left message papers were ready

## 2012-06-04 NOTE — Addendum Note (Signed)
Addended by: Kandis Fantasia B on: 06/04/2012 04:09 PM   Modules accepted: Orders

## 2012-06-10 ENCOUNTER — Other Ambulatory Visit: Payer: Self-pay | Admitting: Family Medicine

## 2012-06-10 LAB — PTH, INTACT AND CALCIUM
Calcium, Total (PTH): 11.2 mg/dL — ABNORMAL HIGH (ref 8.4–10.5)
PTH: 103 pg/mL — ABNORMAL HIGH (ref 14.0–72.0)

## 2012-07-16 ENCOUNTER — Ambulatory Visit: Payer: BC Managed Care – PPO | Admitting: Family Medicine

## 2012-07-23 ENCOUNTER — Other Ambulatory Visit: Payer: Self-pay | Admitting: Family Medicine

## 2012-09-02 ENCOUNTER — Other Ambulatory Visit: Payer: Self-pay

## 2012-09-02 DIAGNOSIS — J309 Allergic rhinitis, unspecified: Secondary | ICD-10-CM

## 2012-09-02 MED ORDER — FLUTICASONE PROPIONATE 50 MCG/ACT NA SUSP: 2.0000 | Freq: Every day | NASAL | Status: DC

## 2012-10-16 ENCOUNTER — Ambulatory Visit (INDEPENDENT_AMBULATORY_CARE_PROVIDER_SITE_OTHER): Payer: BC Managed Care – PPO | Admitting: Family Medicine

## 2012-10-16 ENCOUNTER — Encounter: Payer: Self-pay | Admitting: Family Medicine

## 2012-10-16 VITALS — BP 126/84 | HR 67 | Resp 16 | Ht 66.0 in | Wt 215.0 lb

## 2012-10-16 DIAGNOSIS — E559 Vitamin D deficiency, unspecified: Secondary | ICD-10-CM

## 2012-10-16 DIAGNOSIS — L723 Sebaceous cyst: Secondary | ICD-10-CM

## 2012-10-16 DIAGNOSIS — R5381 Other malaise: Secondary | ICD-10-CM

## 2012-10-16 DIAGNOSIS — R7309 Other abnormal glucose: Secondary | ICD-10-CM

## 2012-10-16 DIAGNOSIS — L089 Local infection of the skin and subcutaneous tissue, unspecified: Secondary | ICD-10-CM

## 2012-10-16 DIAGNOSIS — I10 Essential (primary) hypertension: Secondary | ICD-10-CM

## 2012-10-16 DIAGNOSIS — R7303 Prediabetes: Secondary | ICD-10-CM

## 2012-10-16 DIAGNOSIS — E8881 Metabolic syndrome: Secondary | ICD-10-CM

## 2012-10-16 DIAGNOSIS — Z1211 Encounter for screening for malignant neoplasm of colon: Secondary | ICD-10-CM

## 2012-10-16 DIAGNOSIS — J309 Allergic rhinitis, unspecified: Secondary | ICD-10-CM

## 2012-10-16 DIAGNOSIS — R5383 Other fatigue: Secondary | ICD-10-CM

## 2012-10-16 DIAGNOSIS — E785 Hyperlipidemia, unspecified: Secondary | ICD-10-CM

## 2012-10-16 DIAGNOSIS — M5090 Cervical disc disorder, unspecified, unspecified cervical region: Secondary | ICD-10-CM

## 2012-10-16 DIAGNOSIS — E669 Obesity, unspecified: Secondary | ICD-10-CM

## 2012-10-16 MED ORDER — FLUTICASONE PROPIONATE 50 MCG/ACT NA SUSP: 2.0000 | Freq: Every day | NASAL | Status: DC

## 2012-10-16 MED ORDER — FLUCONAZOLE 150 MG PO TABS: ORAL_TABLET | ORAL | Status: AC

## 2012-10-16 MED ORDER — DOXYCYCLINE HYCLATE 100 MG PO CAPS: 100.0000 mg | ORAL_CAPSULE | Freq: Two times a day (BID) | ORAL | Status: AC

## 2012-10-16 MED ORDER — ERGOCALCIFEROL 1.25 MG (50000 UT) PO CAPS: 50000.0000 [IU] | ORAL_CAPSULE | ORAL | Status: DC

## 2012-10-16 MED ORDER — MUPIROCIN 2 % EX OINT: TOPICAL_OINTMENT | CUTANEOUS | Status: DC

## 2012-10-16 MED ORDER — LORATADINE 10 MG PO TABS: 10.0000 mg | ORAL_TABLET | Freq: Every day | ORAL | Status: DC

## 2012-10-16 NOTE — Progress Notes (Signed)
  Subjective:    Patient ID: Laurie Olson, female    DOB: January 20, 1961, 52 y.o.   MRN: [id number]  HPI Tender swelling on right neck following insect bite last week, in for help with this as the area is becoming larger and more painful. 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 Denies fever, chills or drainage from the site, denies neck stiffness Questions about f/u with endocrinologist who she was referred to re hypercalcemia, states that she has had no f/u re further tests that were ordered, due to his change in location, contact info was provided. She has not   been working consistently  on lifestyle change to improve health with weight gain, she is encouraged to and reminded of the need to address this for her health , as she is both prediabetic and has hyperlipidemia Review of Systems See HPI Denies recent fever or chills. Denies sinus pressure, excessive  nasal congestion, ear pain or sore throat.Allergies are managed with medication Denies chest congestion, productive cough or wheezing. Denies chest pains, palpitations and leg swelling Denies abdominal pain, nausea, vomiting,diarrhea or constipation.   Denies dysuria, frequency, hesitancy or incontinence. Denies joint pain, swelling and limitation in mobility. Denies headaches, seizures, numbness, or tingling. Denies depression, anxiety or insomnia.       Objective:   Physical Exam Patient alert and oriented and in no cardiopulmonary distress.  HEENT: No facial asymmetry, EOMI, no sinus tenderness,  oropharynx pink and moist.  Neck supple no adenopathy.  Chest: Clear to auscultation bilaterally.  CVS: S1, S2 no murmurs, no S3.  ABD: Soft non tender. Bowel sounds normal.  Ext: No edema  MS: Adequate ROM spine, shoulders, hips and knees.  Skin: erythematous , tender swelling on right neck, max diameter approx 2.5 cm, no puncture site noted, no drainage from the area  noted  Psych: Good eye contact, normal affect. Memory intact not anxious or depressed appearing.  CNS: CN 2-12 intact, power, tone and sensation normal throughout.        Assessment & Plan:

## 2012-10-16 NOTE — Patient Instructions (Addendum)
F/u end October, please call if you need me before  You are started on an antibiotc by mouth , as well as oral antibiotic ointment for boil on the right side of your neck, fluconazole is also prescribed. Keep the area clean and dry, and do  NOT squeeze, let lesion drain on it's own  You are also referred to dermatology for f/u next Monday, if the area has opened and is draining on its own, this may not be necessary, and if you feel that you no longer need to  Go , please call the office and let them know. If you are much better by Friday, call in the morning, and you let us know , we will pass on the message   Vit D weekly for 6 months needed  Reduce sweets and carbs and exercise regularly to help prevent diabetes  Fasting lipid, cmp and HBA1c end October before f/u  You are referre to dr Page Spiro for colonoscopy

## 2012-10-18 ENCOUNTER — Encounter (INDEPENDENT_AMBULATORY_CARE_PROVIDER_SITE_OTHER): Payer: Self-pay | Admitting: *Deleted

## 2012-10-20 DIAGNOSIS — E8881 Metabolic syndrome: Secondary | ICD-10-CM | POA: Insufficient documentation

## 2012-10-20 DIAGNOSIS — R7303 Prediabetes: Secondary | ICD-10-CM | POA: Insufficient documentation

## 2012-10-20 NOTE — Assessment & Plan Note (Signed)
Pt at increased CV risk, needs to be consistent with lifestyle changes to reverse this

## 2012-10-20 NOTE — Assessment & Plan Note (Signed)
Patient educated about the importance of limiting  Carbohydrate intake , the need to commit to daily physical activity for a minimum of 30 minutes , and to commit weight loss. The fact that changes in all these areas will reduce or eliminate all together the development of diabetes is stressed.   Updated lab for next visit 

## 2012-10-20 NOTE — Assessment & Plan Note (Signed)
Oral and topical antibiotics prescribed, pt also referred for I/D to dermatology, if needed, in next 5 days. She is to call back if no longer needs to go

## 2012-10-20 NOTE — Assessment & Plan Note (Signed)
Deteriorated. Patient re-educated about  the importance of commitment to a  minimum of 150 minutes of exercise per week. The importance of healthy food choices with portion control discussed. Encouraged to start a food diary, count calories and to consider  joining a support group. Sample diet sheets offered. Goals set by the patient for the next several months.    

## 2012-10-20 NOTE — Assessment & Plan Note (Signed)
Hyperlipidemia:Low fat diet discussed and encouraged.  Updated lab in 2 month

## 2012-10-20 NOTE — Assessment & Plan Note (Signed)
Controlled, no change in medication  

## 2012-10-20 NOTE — Assessment & Plan Note (Signed)
Needs weekly vit d for 6 month, she is aware

## 2012-10-20 NOTE — Assessment & Plan Note (Signed)
Controlled, no change in medication DASH diet and commitment to daily physical activity for a minimum of 30 minutes discussed and encouraged, as a part of hypertension management. The importance of attaining a healthy weight is also discussed.  

## 2012-10-21 ENCOUNTER — Telehealth: Payer: Self-pay | Admitting: Family Medicine

## 2012-10-21 NOTE — Telephone Encounter (Signed)
Nice to hear, pls let dermatology know to cancel the appt

## 2012-12-18 ENCOUNTER — Ambulatory Visit: Payer: BC Managed Care – PPO | Admitting: Family Medicine

## 2012-12-31 ENCOUNTER — Telehealth: Payer: Self-pay | Admitting: Family Medicine

## 2013-01-03 ENCOUNTER — Other Ambulatory Visit: Payer: Self-pay

## 2013-01-03 MED ORDER — METOPROLOL SUCCINATE ER 25 MG PO TB24: 25.0000 mg | ORAL_TABLET | Freq: Every day | ORAL | Status: DC

## 2013-01-10 ENCOUNTER — Other Ambulatory Visit: Payer: Self-pay

## 2013-01-10 DIAGNOSIS — J309 Allergic rhinitis, unspecified: Secondary | ICD-10-CM

## 2013-01-10 MED ORDER — LORATADINE 10 MG PO TABS: 10.0000 mg | ORAL_TABLET | Freq: Every day | ORAL | Status: DC

## 2013-01-10 MED ORDER — AMLODIPINE BESYLATE 10 MG PO TABS: ORAL_TABLET | ORAL | Status: DC

## 2013-01-10 MED ORDER — FLUTICASONE PROPIONATE 50 MCG/ACT NA SUSP: 2.0000 | Freq: Every day | NASAL | Status: DC

## 2013-01-10 MED ORDER — PRAVASTATIN SODIUM 40 MG PO TABS: ORAL_TABLET | ORAL | Status: DC

## 2013-01-10 NOTE — Telephone Encounter (Signed)
Meds refilled for 30 day supply only.  Patient was to folllowup in Oct.  No appt scheduled as of yet.

## 2013-03-18 ENCOUNTER — Other Ambulatory Visit: Payer: Self-pay

## 2013-03-18 ENCOUNTER — Telehealth: Payer: Self-pay | Admitting: Family Medicine

## 2013-03-18 MED ORDER — AMLODIPINE BESYLATE 10 MG PO TABS: ORAL_TABLET | ORAL | Status: DC

## 2013-03-19 ENCOUNTER — Other Ambulatory Visit: Payer: Self-pay

## 2013-03-19 DIAGNOSIS — J309 Allergic rhinitis, unspecified: Secondary | ICD-10-CM

## 2013-03-19 MED ORDER — PRAVASTATIN SODIUM 40 MG PO TABS: ORAL_TABLET | ORAL | Status: DC

## 2013-03-19 MED ORDER — FLUTICASONE PROPIONATE 50 MCG/ACT NA SUSP
2.0000 | Freq: Every day | NASAL | Status: DC
Start: 2013-03-19 — End: 2013-05-19

## 2013-03-19 MED ORDER — METOPROLOL SUCCINATE ER 25 MG PO TB24
25.0000 mg | ORAL_TABLET | Freq: Every day | ORAL | Status: DC
Start: 2013-03-19 — End: 2013-05-19

## 2013-03-19 MED ORDER — LORATADINE 10 MG PO TABS: 10.0000 mg | ORAL_TABLET | Freq: Every day | ORAL | Status: DC

## 2013-03-19 NOTE — Telephone Encounter (Signed)
meds refilled 

## 2013-05-19 ENCOUNTER — Telehealth: Payer: Self-pay | Admitting: Family Medicine

## 2013-05-19 DIAGNOSIS — J309 Allergic rhinitis, unspecified: Secondary | ICD-10-CM

## 2013-05-19 DIAGNOSIS — E559 Vitamin D deficiency, unspecified: Secondary | ICD-10-CM

## 2013-05-19 MED ORDER — FLUTICASONE PROPIONATE 50 MCG/ACT NA SUSP
2.0000 | Freq: Every day | NASAL | Status: DC
Start: 2013-05-19 — End: 2013-09-17

## 2013-05-19 MED ORDER — ERGOCALCIFEROL 1.25 MG (50000 UT) PO CAPS: 50000.0000 [IU] | ORAL_CAPSULE | ORAL | Status: DC

## 2013-05-19 MED ORDER — LORATADINE 10 MG PO TABS: 10.0000 mg | ORAL_TABLET | Freq: Every day | ORAL | Status: DC

## 2013-05-19 MED ORDER — AMLODIPINE BESYLATE 10 MG PO TABS: ORAL_TABLET | ORAL | Status: DC

## 2013-05-19 MED ORDER — PRAVASTATIN SODIUM 40 MG PO TABS: ORAL_TABLET | ORAL | Status: DC

## 2013-05-19 MED ORDER — METOPROLOL SUCCINATE ER 25 MG PO TB24: 25.0000 mg | ORAL_TABLET | Freq: Every day | ORAL | Status: DC

## 2013-05-19 NOTE — Telephone Encounter (Signed)
30 days only have been sent. Patient was supposed to follow up in Oct. Msg to pharmacist that appt was needed

## 2013-06-09 ENCOUNTER — Telehealth: Payer: Self-pay

## 2013-06-09 NOTE — Telephone Encounter (Signed)
Requested information faxed to employer.

## 2013-07-15 ENCOUNTER — Encounter: Payer: Self-pay | Admitting: Family Medicine

## 2013-07-15 ENCOUNTER — Other Ambulatory Visit (HOSPITAL_COMMUNITY)
Admission: RE | Admit: 2013-07-15 | Discharge: 2013-07-15 | Disposition: A | Discharge status: Home / Self Care | Payer: BC Managed Care – PPO | Source: Ambulatory Visit | Attending: Family Medicine | Admitting: Family Medicine

## 2013-07-15 ENCOUNTER — Ambulatory Visit (INDEPENDENT_AMBULATORY_CARE_PROVIDER_SITE_OTHER): Payer: BC Managed Care – PPO | Admitting: Family Medicine

## 2013-07-15 ENCOUNTER — Other Ambulatory Visit (INDEPENDENT_AMBULATORY_CARE_PROVIDER_SITE_OTHER): Payer: Self-pay | Admitting: *Deleted

## 2013-07-15 ENCOUNTER — Encounter (INDEPENDENT_AMBULATORY_CARE_PROVIDER_SITE_OTHER): Payer: Self-pay

## 2013-07-15 ENCOUNTER — Encounter (INDEPENDENT_AMBULATORY_CARE_PROVIDER_SITE_OTHER): Payer: Self-pay | Admitting: *Deleted

## 2013-07-15 VITALS — BP 124/84 | HR 65 | Resp 16 | Wt 213.1 lb

## 2013-07-15 DIAGNOSIS — Z1211 Encounter for screening for malignant neoplasm of colon: Secondary | ICD-10-CM

## 2013-07-15 DIAGNOSIS — Z01419 Encounter for gynecological examination (general) (routine) without abnormal findings: Secondary | ICD-10-CM | POA: Insufficient documentation

## 2013-07-15 DIAGNOSIS — R7309 Other abnormal glucose: Secondary | ICD-10-CM

## 2013-07-15 DIAGNOSIS — R7303 Prediabetes: Secondary | ICD-10-CM

## 2013-07-15 DIAGNOSIS — E8881 Metabolic syndrome: Secondary | ICD-10-CM

## 2013-07-15 DIAGNOSIS — Z1151 Encounter for screening for human papillomavirus (HPV): Secondary | ICD-10-CM | POA: Insufficient documentation

## 2013-07-15 DIAGNOSIS — Z1231 Encounter for screening mammogram for malignant neoplasm of breast: Secondary | ICD-10-CM

## 2013-07-15 DIAGNOSIS — I10 Essential (primary) hypertension: Secondary | ICD-10-CM

## 2013-07-15 DIAGNOSIS — E559 Vitamin D deficiency, unspecified: Secondary | ICD-10-CM

## 2013-07-15 DIAGNOSIS — E785 Hyperlipidemia, unspecified: Secondary | ICD-10-CM

## 2013-07-15 DIAGNOSIS — Z1239 Encounter for other screening for malignant neoplasm of breast: Secondary | ICD-10-CM

## 2013-07-15 DIAGNOSIS — Z124 Encounter for screening for malignant neoplasm of cervix: Secondary | ICD-10-CM

## 2013-07-15 DIAGNOSIS — Z0000 Encounter for general adult medical examination without abnormal findings: Secondary | ICD-10-CM

## 2013-07-15 LAB — COMPREHENSIVE METABOLIC PANEL
ALBUMIN: 4.5 g/dL (ref 3.5–5.2)
ALT: 18 U/L (ref 0–35)
AST: 20 U/L (ref 0–37)
Alkaline Phosphatase: 84 U/L (ref 39–117)
BUN: 10 mg/dL (ref 6–23)
CALCIUM: 10.9 mg/dL — AB (ref 8.4–10.5)
CHLORIDE: 100 meq/L (ref 96–112)
CO2: 27 meq/L (ref 19–32)
CREATININE: 0.74 mg/dL (ref 0.50–1.10)
GLUCOSE: 85 mg/dL (ref 70–99)
POTASSIUM: 4.3 meq/L (ref 3.5–5.3)
Sodium: 134 mEq/L — ABNORMAL LOW (ref 135–145)
Total Bilirubin: 0.9 mg/dL (ref 0.2–1.2)
Total Protein: 7.8 g/dL (ref 6.0–8.3)

## 2013-07-15 LAB — POC HEMOCCULT BLD/STL (OFFICE/1-CARD/DIAGNOSTIC): Fecal Occult Blood, POC: NEGATIVE

## 2013-07-15 LAB — LIPID PANEL
CHOLESTEROL: 269 mg/dL — AB (ref 0–200)
HDL: 58 mg/dL (ref >39)
LDL Cholesterol: 179 mg/dL — ABNORMAL HIGH (ref 0–99)
Total CHOL/HDL Ratio: 4.6 Ratio
Triglycerides: 160 mg/dL — ABNORMAL HIGH (ref <150)
VLDL: 32 mg/dL (ref 0–40)

## 2013-07-15 LAB — CBC
HCT: 42.8 % (ref 36.0–46.0)
HEMOGLOBIN: 14.7 g/dL (ref 12.0–15.0)
MCH: 30.3 pg (ref 26.0–34.0)
MCHC: 34.3 g/dL (ref 30.0–36.0)
MCV: 88.2 fL (ref 78.0–100.0)
Platelets: 230 10*3/uL (ref 150–400)
RBC: 4.85 MIL/uL (ref 3.87–5.11)
RDW: 13.5 % (ref 11.5–15.5)
WBC: 4.1 10*3/uL (ref 4.0–10.5)

## 2013-07-15 LAB — TSH: TSH: 0.377 u[IU]/mL (ref 0.350–4.500)

## 2013-07-15 LAB — HEMOGLOBIN A1C
Hgb A1c MFr Bld: 5.7 % — ABNORMAL HIGH (ref <5.7)
Mean Plasma Glucose: 117 mg/dL — ABNORMAL HIGH (ref <117)

## 2013-07-15 MED ORDER — METOPROLOL SUCCINATE ER 25 MG PO TB24: 25.0000 mg | ORAL_TABLET | Freq: Every day | ORAL | Status: DC

## 2013-07-15 MED ORDER — ERGOCALCIFEROL 1.25 MG (50000 UT) PO CAPS: 50000.0000 [IU] | ORAL_CAPSULE | ORAL | Status: DC

## 2013-07-15 MED ORDER — AMLODIPINE BESYLATE 10 MG PO TABS: ORAL_TABLET | ORAL | Status: DC

## 2013-07-15 MED ORDER — PRAVASTATIN SODIUM 40 MG PO TABS: ORAL_TABLET | ORAL | Status: DC

## 2013-07-15 NOTE — Patient Instructions (Addendum)
F/u in 4 month, call if you need me before  Change eating habits , food choices as discussed, then you will be healthier and aim is 2 pound per month of weight loss  Labs CBC, lipid, cmp, HBA1C, TSH and vit D  You will be contacted re lab results.  No medication for fungal toenails till we have labs .,  You need mammogram, you will get the appointment at checkout    Call   For your colonoscopy appointment with Dr Karilyn Cotaehman

## 2013-07-16 LAB — VITAMIN D 25 HYDROXY (VIT D DEFICIENCY, FRACTURES): VIT D 25 HYDROXY: 17 ng/mL — AB (ref 30–89)

## 2013-07-23 ENCOUNTER — Other Ambulatory Visit: Payer: Self-pay | Admitting: Family Medicine

## 2013-07-23 MED ORDER — TERBINAFINE HCL 250 MG PO TABS: 250.0000 mg | ORAL_TABLET | Freq: Every day | ORAL | Status: DC

## 2013-07-24 ENCOUNTER — Ambulatory Visit (HOSPITAL_COMMUNITY): Payer: BC Managed Care – PPO

## 2013-07-29 ENCOUNTER — Ambulatory Visit (HOSPITAL_COMMUNITY): Payer: BC Managed Care – PPO

## 2013-07-30 ENCOUNTER — Telehealth: Payer: Self-pay | Admitting: Family Medicine

## 2013-07-31 ENCOUNTER — Telehealth: Payer: Self-pay | Admitting: Family Medicine

## 2013-08-01 MED ORDER — VITAMIN D (ERGOCALCIFEROL) 1.25 MG (50000 UNIT) PO CAPS: 50000.0000 [IU] | ORAL_CAPSULE | ORAL | Status: DC

## 2013-08-01 MED ORDER — ATORVASTATIN CALCIUM 20 MG PO TABS: 20.0000 mg | ORAL_TABLET | Freq: Every day | ORAL | Status: DC

## 2013-08-01 NOTE — Telephone Encounter (Signed)
Pt aware.

## 2013-08-11 ENCOUNTER — Telehealth (INDEPENDENT_AMBULATORY_CARE_PROVIDER_SITE_OTHER): Payer: Self-pay | Admitting: *Deleted

## 2013-08-11 DIAGNOSIS — Z1211 Encounter for screening for malignant neoplasm of colon: Secondary | ICD-10-CM

## 2013-08-11 NOTE — Telephone Encounter (Signed)
Patient needs movi prep 

## 2013-08-21 MED ORDER — PEG-KCL-NACL-NASULF-NA ASC-C 100 G PO SOLR: 1.0000 | Freq: Once | ORAL | Status: DC

## 2013-09-01 DIAGNOSIS — Z0000 Encounter for general adult medical examination without abnormal findings: Secondary | ICD-10-CM | POA: Insufficient documentation

## 2013-09-01 DIAGNOSIS — Z1211 Encounter for screening for malignant neoplasm of colon: Secondary | ICD-10-CM | POA: Insufficient documentation

## 2013-09-01 NOTE — Assessment & Plan Note (Signed)
Annual exam as documented. Counseling done  re healthy lifestyle involving commitment to 150 minutes exercise per week, heart healthy diet, and attaining healthy weight.The importance of adequate sleep also discussed. Regular seat belt use  is also discussed. Changes in health habits are decided on by the patient with goals and time frames  set for achieving them. Immunization and cancer screening needs are specifically addressed at this visit.Needs colonoscopy, this is past due, she is to call

## 2013-09-01 NOTE — Progress Notes (Signed)
   Subjective:    Patient ID: Laurie Olson, female    DOB: 03-07-1960, 53 y.o.   MRN: [id number]  HPI Patient is in for pelvic and breast exam. No other health concerns are expressed or addressed at the visit. Labs will be reviewed and addressed after the visit , when they are available    Review of Systems See HPI     Objective:   Physical Exam Pleasant overweight female, alert and oriented x 3, in no cardio-pulmonary distress. Afebrile. HEENT No facial trauma or asymetry. Sinuses non tender.  EOMI, PERTL, fundoscopic exam  no hemorhage or exudate.  External ears normal, tympanic membranes clear. Oropharynx moist, no exudate, good dentition. Neck: supple, no adenopathy,JVD or thyromegaly.No bruits.  Chest: Clear to ascultation bilaterally.No crackles or wheezes. Non tender to palpation  Breast: No asymetry,no masses or lumps. No tenderness. No nipple discharge or inversion. No axillary or supraclavicular adenopathy  Cardiovascular system; Heart sounds normal,  S1 and  S2 ,no S3.  No murmur, or thrill. Apical beat not displaced Peripheral pulses normal.  Abdomen: Soft, non tender, no organomegaly or masses. No bruits. Bowel sounds normal. No guarding, tenderness or rebound.  Rectal:  Normal sphincter tone. No mass.No rectal masses.  Guaiac negative stool.  GU: External genitalia normal female genitalia , female distribution of hair. No lesions. Urethral meatus normal in size, no  Prolapse, no lesions visibly  Present. Bladder non tender. Vagina pink and moist , with no visible lesions , discharge present . Adequate pelvic support no  cystocele or rectocele noted Cervix pink and appears healthy, no lesions or ulcerations noted, no discharge noted from os Uterus normal size, no adnexal masses, no cervical motion or adnexal tenderness.   Musculoskeletal exam: Full ROM of spine, hips , shoulders and knees. No deformity ,swelling or crepitus noted. No muscle  wasting or atrophy.   Neurologic: Cranial nerves 2 to 12 intact. Power, tone ,sensation and reflexes normal throughout. No disturbance in gait. No tremor.  Skin: Intact, no ulceration, erythema , scaling or rash noted. Pigmentation normal throughout Onychomycosis affecting toenails of both feet  Psych; Normal mood and affect. Judgement and concentration normal        Assessment & Plan:  Encounter for annual physical exam Annual exam as documented. Counseling done  re healthy lifestyle involving commitment to 150 minutes exercise per week, heart healthy diet, and attaining healthy weight.The importance of adequate sleep also discussed. Regular seat belt use  is also discussed. Changes in health habits are decided on by the patient with goals and time frames  set for achieving them. Immunization and cancer screening needs are specifically addressed at this visit.Needs colonoscopy, this is past due, she is to call

## 2013-09-10 ENCOUNTER — Telehealth (INDEPENDENT_AMBULATORY_CARE_PROVIDER_SITE_OTHER): Payer: Self-pay | Admitting: *Deleted

## 2013-09-10 NOTE — Telephone Encounter (Signed)
  Procedure: tcs  Reason/Indication:  screening  Has patient had this procedure before?  no  If so, when, by whom and where?    Is there a family history of colon cancer?  no  Who?  What age when diagnosed?    Is patient diabetic?   no      Does patient have prosthetic heart valve?  no  Do you have a pacemaker?  no  Has patient ever had endocarditis? no  Has patient had joint replacement within last 12 months?  no  Does patient tend to be constipated or take laxatives? no  Is patient on Coumadin, Plavix and/or Aspirin? no  Medications: see EPIC  Allergies: see EPIC  Medication Adjustment:   Procedure date & time: 10/01/13 at 1030

## 2013-09-11 NOTE — Telephone Encounter (Signed)
agree

## 2013-09-17 ENCOUNTER — Encounter (HOSPITAL_COMMUNITY): Payer: Self-pay | Admitting: Pharmacy Technician

## 2013-09-25 ENCOUNTER — Encounter (INDEPENDENT_AMBULATORY_CARE_PROVIDER_SITE_OTHER): Payer: Self-pay | Admitting: *Deleted

## 2013-10-29 ENCOUNTER — Telehealth (INDEPENDENT_AMBULATORY_CARE_PROVIDER_SITE_OTHER): Payer: Self-pay | Admitting: *Deleted

## 2013-10-29 DIAGNOSIS — Z1211 Encounter for screening for malignant neoplasm of colon: Secondary | ICD-10-CM

## 2013-10-29 NOTE — Telephone Encounter (Signed)
Patient needs movi prep 

## 2013-10-31 MED ORDER — PEG-KCL-NACL-NASULF-NA ASC-C 100 G PO SOLR: 1.0000 | Freq: Once | ORAL | Status: DC

## 2013-11-05 ENCOUNTER — Ambulatory Visit: Payer: BC Managed Care – PPO | Admitting: Family Medicine

## 2013-11-21 ENCOUNTER — Telehealth (INDEPENDENT_AMBULATORY_CARE_PROVIDER_SITE_OTHER): Payer: Self-pay | Admitting: *Deleted

## 2013-11-21 NOTE — Telephone Encounter (Signed)
PCP: simpson   Procedure: tcs  Reason/Indication:  screening  Has patient had this procedure before?  no  If so, when, by whom and where?    Is there a family history of colon cancer?  no  Who?  What age when diagnosed?    Is patient diabetic?   no      Does patient have prosthetic heart valve?  no  Do you have a pacemaker?  no  Has patient ever had endocarditis? no  Has patient had joint replacement within last 12 months?  no  Does patient tend to be constipated or take laxatives? no  Is patient on Coumadin, Plavix and/or Aspirin? no  Medications: see epic  Allergies: see epic  Medication Adjustment:   Procedure date & time: 12/17/13 at 830

## 2013-11-24 NOTE — Telephone Encounter (Signed)
agree

## 2013-12-16 ENCOUNTER — Other Ambulatory Visit (INDEPENDENT_AMBULATORY_CARE_PROVIDER_SITE_OTHER): Payer: Self-pay | Admitting: *Deleted

## 2013-12-16 DIAGNOSIS — Z1211 Encounter for screening for malignant neoplasm of colon: Secondary | ICD-10-CM

## 2013-12-17 ENCOUNTER — Ambulatory Visit (HOSPITAL_COMMUNITY)
Admission: RE | Admit: 2013-12-17 | Payer: BC Managed Care – PPO | Source: Ambulatory Visit | Admitting: Internal Medicine

## 2013-12-17 ENCOUNTER — Encounter (HOSPITAL_COMMUNITY): Admission: RE | Payer: Self-pay | Source: Ambulatory Visit

## 2013-12-17 SURGERY — COLONOSCOPY
Anesthesia: Moderate Sedation

## 2014-01-29 ENCOUNTER — Other Ambulatory Visit: Payer: Self-pay

## 2014-01-29 ENCOUNTER — Other Ambulatory Visit: Payer: Self-pay | Admitting: Family Medicine

## 2014-01-29 MED ORDER — METOPROLOL SUCCINATE ER 25 MG PO TB24: 25.0000 mg | ORAL_TABLET | Freq: Every day | ORAL | Status: DC

## 2014-03-13 ENCOUNTER — Other Ambulatory Visit: Payer: Self-pay | Admitting: Family Medicine

## 2014-03-23 ENCOUNTER — Other Ambulatory Visit: Payer: Self-pay | Admitting: Family Medicine

## 2014-04-10 ENCOUNTER — Telehealth: Payer: Self-pay | Admitting: Family Medicine

## 2014-04-10 ENCOUNTER — Other Ambulatory Visit: Payer: Self-pay

## 2014-04-10 MED ORDER — AMLODIPINE BESYLATE 10 MG PO TABS: ORAL_TABLET | ORAL | Status: DC

## 2014-04-10 NOTE — Telephone Encounter (Signed)
Med refilled with 30 day supply only

## 2014-05-11 ENCOUNTER — Other Ambulatory Visit: Payer: Self-pay | Admitting: Family Medicine

## 2014-06-15 ENCOUNTER — Telehealth: Payer: Self-pay | Admitting: Family Medicine

## 2014-06-15 ENCOUNTER — Other Ambulatory Visit: Payer: Self-pay | Admitting: Family Medicine

## 2014-06-15 NOTE — Telephone Encounter (Signed)
meds sent

## 2014-06-17 ENCOUNTER — Encounter: Payer: Self-pay | Admitting: Family Medicine

## 2014-06-17 ENCOUNTER — Ambulatory Visit (INDEPENDENT_AMBULATORY_CARE_PROVIDER_SITE_OTHER): Payer: BLUE CROSS/BLUE SHIELD | Admitting: Family Medicine

## 2014-06-17 VITALS — BP 160/100 | HR 68 | Resp 16 | Ht 66.0 in | Wt 218.8 lb

## 2014-06-17 DIAGNOSIS — M5410 Radiculopathy, site unspecified: Secondary | ICD-10-CM | POA: Diagnosis not present

## 2014-06-17 DIAGNOSIS — E785 Hyperlipidemia, unspecified: Secondary | ICD-10-CM

## 2014-06-17 DIAGNOSIS — I10 Essential (primary) hypertension: Secondary | ICD-10-CM | POA: Diagnosis not present

## 2014-06-17 DIAGNOSIS — R7309 Other abnormal glucose: Secondary | ICD-10-CM

## 2014-06-17 DIAGNOSIS — R7303 Prediabetes: Secondary | ICD-10-CM

## 2014-06-17 DIAGNOSIS — F32A Depression, unspecified: Secondary | ICD-10-CM | POA: Insufficient documentation

## 2014-06-17 DIAGNOSIS — N951 Menopausal and female climacteric states: Secondary | ICD-10-CM

## 2014-06-17 DIAGNOSIS — E559 Vitamin D deficiency, unspecified: Secondary | ICD-10-CM

## 2014-06-17 DIAGNOSIS — F329 Major depressive disorder, single episode, unspecified: Secondary | ICD-10-CM

## 2014-06-17 DIAGNOSIS — M5090 Cervical disc disorder, unspecified, unspecified cervical region: Secondary | ICD-10-CM

## 2014-06-17 LAB — CBC WITH DIFFERENTIAL/PLATELET
Basophils Absolute: 0 10*3/uL (ref 0.0–0.1)
Basophils Relative: 1 % (ref 0–1)
EOS ABS: 0.1 10*3/uL (ref 0.0–0.7)
Eosinophils Relative: 2 % (ref 0–5)
HCT: 40.7 % (ref 36.0–46.0)
Hemoglobin: 13.8 g/dL (ref 12.0–15.0)
Lymphocytes Relative: 45 % (ref 12–46)
Lymphs Abs: 1.6 10*3/uL (ref 0.7–4.0)
MCH: 30.2 pg (ref 26.0–34.0)
MCHC: 33.9 g/dL (ref 30.0–36.0)
MCV: 89.1 fL (ref 78.0–100.0)
MONOS PCT: 8 % (ref 3–12)
MPV: 10.1 fL (ref 8.6–12.4)
Monocytes Absolute: 0.3 10*3/uL (ref 0.1–1.0)
NEUTROS PCT: 44 % (ref 43–77)
Neutro Abs: 1.6 10*3/uL — ABNORMAL LOW (ref 1.7–7.7)
Platelets: 195 10*3/uL (ref 150–400)
RBC: 4.57 MIL/uL (ref 3.87–5.11)
RDW: 14 % (ref 11.5–15.5)
WBC: 3.6 10*3/uL — ABNORMAL LOW (ref 4.0–10.5)

## 2014-06-17 LAB — LIPID PANEL
CHOL/HDL RATIO: 4.4 ratio
Cholesterol: 247 mg/dL — ABNORMAL HIGH (ref 0–200)
HDL: 56 mg/dL (ref >=46)
LDL Cholesterol: 164 mg/dL — ABNORMAL HIGH (ref 0–99)
Triglycerides: 136 mg/dL (ref <150)
VLDL: 27 mg/dL (ref 0–40)

## 2014-06-17 LAB — COMPLETE METABOLIC PANEL WITH GFR
ALBUMIN: 4 g/dL (ref 3.5–5.2)
ALK PHOS: 76 U/L (ref 39–117)
ALT: 21 U/L (ref 0–35)
AST: 20 U/L (ref 0–37)
BUN: 12 mg/dL (ref 6–23)
CALCIUM: 10.3 mg/dL (ref 8.4–10.5)
CO2: 23 mEq/L (ref 19–32)
Chloride: 105 mEq/L (ref 96–112)
Creat: 0.78 mg/dL (ref 0.50–1.10)
GFR, Est African American: >89 mL/min
GFR, Est Non African American: 86 mL/min
Glucose, Bld: 79 mg/dL (ref 70–99)
POTASSIUM: 3.8 meq/L (ref 3.5–5.3)
Sodium: 139 mEq/L (ref 135–145)
Total Bilirubin: 0.8 mg/dL (ref 0.2–1.2)
Total Protein: 7.1 g/dL (ref 6.0–8.3)

## 2014-06-17 LAB — TSH: TSH: 0.483 u[IU]/mL (ref 0.350–4.500)

## 2014-06-17 MED ORDER — PRAVASTATIN SODIUM 40 MG PO TABS: ORAL_TABLET | ORAL | Status: DC

## 2014-06-17 MED ORDER — GABAPENTIN 100 MG PO CAPS: 100.0000 mg | ORAL_CAPSULE | Freq: Every day | ORAL | Status: DC

## 2014-06-17 MED ORDER — METHYLPREDNISOLONE ACETATE 80 MG/ML IJ SUSP
80.0000 mg | Freq: Once | INTRAMUSCULAR | Status: AC
  Administered 2014-06-17: 80 mg via INTRAMUSCULAR

## 2014-06-17 MED ORDER — KETOROLAC TROMETHAMINE 60 MG/2ML IM SOLN
60.0000 mg | Freq: Once | INTRAMUSCULAR | Status: AC
  Administered 2014-06-17: 60 mg via INTRAMUSCULAR

## 2014-06-17 MED ORDER — AMLODIPINE BESYLATE 10 MG PO TABS: ORAL_TABLET | ORAL | Status: DC

## 2014-06-17 MED ORDER — PREDNISONE 5 MG (21) PO TBPK
5.0000 mg | ORAL_TABLET | Freq: Every day | ORAL | Status: DC
Start: 2014-06-17 — End: 2014-07-22

## 2014-06-17 MED ORDER — METOPROLOL SUCCINATE ER 25 MG PO TB24: 25.0000 mg | ORAL_TABLET | Freq: Every day | ORAL | Status: DC

## 2014-06-17 MED ORDER — ERGOCALCIFEROL 1.25 MG (50000 UT) PO CAPS: 50000.0000 [IU] | ORAL_CAPSULE | ORAL | Status: DC

## 2014-06-17 MED ORDER — VENLAFAXINE HCL ER 75 MG PO CP24: 75.0000 mg | ORAL_CAPSULE | Freq: Every day | ORAL | Status: DC

## 2014-06-17 MED ORDER — LORAZEPAM 1 MG PO TABS: ORAL_TABLET | ORAL | Status: DC

## 2014-06-17 MED ORDER — ATORVASTATIN CALCIUM 20 MG PO TABS: 20.0000 mg | ORAL_TABLET | Freq: Every day | ORAL | Status: DC

## 2014-06-17 MED ORDER — IBUPROFEN 800 MG PO TABS: 800.0000 mg | ORAL_TABLET | Freq: Three times a day (TID) | ORAL | Status: DC

## 2014-06-17 NOTE — Progress Notes (Signed)
   Subjective:    Patient ID: Laurie Olson, female    DOB: 02-04-1961, 54 y.o.   MRN: [id number]  HPI Pt in with main concerns of 3 month h/o back and neck pain radiating to lower and upper extremities which is worsening, also notes weakness and abnormal sensation in the extremities Not on her BP meds for several months C/o increased irritability with hot flashes,multiple stresdsors in her life which have caused her to be depressed and overwhelmed , including separation and financial distress All cancer screening is overdue   Review of Systems See HPI Denies recent fever or chills. Denies sinus pressure, nasal congestion, ear pain or sore throat. Denies chest congestion, productive cough or wheezing. Denies chest pains, palpitations and leg swelling Denies abdominal pain, nausea, vomiting,diarrhea or constipation.   Denies dysuria, frequency, hesitancy or incontinence. Denies skin break down or rash.        Objective:   Physical Exam BP 160/100 mmHg  Pulse 68  Resp 16  Ht 5\' 6"  (1.676 m)  Wt 218 lb 12.8 oz (99.247 kg)  BMI 35.33 kg/m2  SpO2 100%  LMP 07/09/2010 Patient alert and oriented and in no cardiopulmonary distress.  HEENT: No facial asymmetry, EOMI,   oropharynx pink and moist.  Neck decresed ROM, no JVD, no mass.  Chest: Clear to auscultation bilaterally.  CVS: S1, S2 no murmurs, no S3.Regular rate.  ABD: Soft non tender.   Ext: No edema  MS: Decreased ROM lumbar  spine, adequate in shoulders, hips and knees.  Skin: Intact, no ulcerations or rash noted.  Psych: Good eye contact, normal affect. Memory intact not anxious or depressed appearing.  CNS: CN 2-12 intact,        Assessment & Plan:  Back pain with left-sided radiculopathy Uncontrolled.Toradol and depo medrol administered IM in the office , to be followed by a short course of oral prednisone and NSAIDS.   Essential hypertension Uncontrolled, start medication DASH diet and commitment  to daily physical activity for a minimum of 30 minutes discussed and encouraged, as a part of hypertension management. The importance of attaining a healthy weight is also discussed.  BP/Weight 07/22/2014 06/30/2014 06/30/2014 06/17/2014 07/15/2013 10/16/2012 05/30/2012  Systolic BP 130 - - 160 124 126 140  Diastolic BP 90 - - 100 84 84 100  Wt. (Lbs) 214.12 210 210 218.8 213.12 215 210.12  BMI 34.58 33.91 33.91 35.33 34.42 34.72 33.93        Cervical neck pain with evidence of disc disease Severe neck pain radiaiting to upper extremities with weakness, anti inflammatories and gabapentin, MRI needed  Morbid obesity Deteriorated. Patient re-educated about  the importance of commitment to a  minimum of 150 minutes of exercise per week.  The importance of healthy food choices with portion control discussed. Encouraged to start a food diary, count calories and to consider  joining a support group. Sample diet sheets offered. Goals set by the patient for the next several months.   Weight /BMI 07/22/2014 06/30/2014 06/30/2014  WEIGHT 214 lb 1.9 oz 210 lb 210 lb  HEIGHT 5\' 6"  5\' 6"  5\' 6"   BMI 34.58 kg/m2 33.91 kg/m2 33.91 kg/m2    Current exercise per week 100 minutes.   Depression Not suicidal or homicidal however,very poor level of function, start medication which will also assist hot flashes and commit to daily physical activity , f/u in 6 weeks  Hot flashes, menopausal Behavioral modification and start efexor

## 2014-06-17 NOTE — Assessment & Plan Note (Signed)
Uncontrolled.Toradol and depo medrol administered IM in the office , to be followed by a short course of oral prednisone and NSAIDS.  

## 2014-06-17 NOTE — Patient Instructions (Signed)
F/u in 5 month, call if you need me before  Injections today for back and neck pain, short course of ibuprofen and prednisone,  And nightly gabapentin prescribed  You need MRI of neck and lower back  CBc, lipid, cmp and EGFr, HBA1C , TSH , vit D today  New for hot flashes and depression is effexor take every day]  Thanks for choosing [facility], we consider it a privelige to serve you.

## 2014-06-18 LAB — HEMOGLOBIN A1C
HEMOGLOBIN A1C: 5.8 % — AB (ref <5.7)
Mean Plasma Glucose: 120 mg/dL — ABNORMAL HIGH (ref <117)

## 2014-06-18 LAB — VITAMIN D 25 HYDROXY (VIT D DEFICIENCY, FRACTURES): Vit D, 25-Hydroxy: 10 ng/mL — ABNORMAL LOW (ref 30–100)

## 2014-06-24 ENCOUNTER — Telehealth: Payer: Self-pay | Admitting: Family Medicine

## 2014-06-24 NOTE — Telephone Encounter (Signed)
pls see where  You had left her a message to call about her labs pls call her back, I did not call

## 2014-06-26 MED ORDER — VITAMIN D (ERGOCALCIFEROL) 1.25 MG (50000 UNIT) PO CAPS: 50000.0000 [IU] | ORAL_CAPSULE | ORAL | Status: DC

## 2014-06-26 NOTE — Telephone Encounter (Signed)
Patient aware of results.

## 2014-06-30 ENCOUNTER — Ambulatory Visit (HOSPITAL_COMMUNITY)
Admission: RE | Admit: 2014-06-30 | Discharge: 2014-06-30 | Disposition: A | Discharge status: Home / Self Care | Payer: BLUE CROSS/BLUE SHIELD | Source: Ambulatory Visit | Attending: Family Medicine | Admitting: Family Medicine

## 2014-06-30 DIAGNOSIS — M5090 Cervical disc disorder, unspecified, unspecified cervical region: Secondary | ICD-10-CM

## 2014-06-30 DIAGNOSIS — M5410 Radiculopathy, site unspecified: Secondary | ICD-10-CM

## 2014-07-22 ENCOUNTER — Other Ambulatory Visit: Payer: Self-pay | Admitting: Family Medicine

## 2014-07-22 ENCOUNTER — Ambulatory Visit (INDEPENDENT_AMBULATORY_CARE_PROVIDER_SITE_OTHER): Payer: BLUE CROSS/BLUE SHIELD | Admitting: Family Medicine

## 2014-07-22 ENCOUNTER — Encounter: Payer: Self-pay | Admitting: Family Medicine

## 2014-07-22 VITALS — BP 130/90 | HR 60 | Resp 18 | Ht 66.0 in | Wt 214.1 lb

## 2014-07-22 DIAGNOSIS — R7309 Other abnormal glucose: Secondary | ICD-10-CM

## 2014-07-22 DIAGNOSIS — E8881 Metabolic syndrome: Secondary | ICD-10-CM

## 2014-07-22 DIAGNOSIS — I10 Essential (primary) hypertension: Secondary | ICD-10-CM

## 2014-07-22 DIAGNOSIS — E785 Hyperlipidemia, unspecified: Secondary | ICD-10-CM

## 2014-07-22 DIAGNOSIS — M5410 Radiculopathy, site unspecified: Secondary | ICD-10-CM | POA: Diagnosis not present

## 2014-07-22 DIAGNOSIS — E559 Vitamin D deficiency, unspecified: Secondary | ICD-10-CM

## 2014-07-22 DIAGNOSIS — M5090 Cervical disc disorder, unspecified, unspecified cervical region: Secondary | ICD-10-CM

## 2014-07-22 DIAGNOSIS — F329 Major depressive disorder, single episode, unspecified: Secondary | ICD-10-CM

## 2014-07-22 DIAGNOSIS — Z1231 Encounter for screening mammogram for malignant neoplasm of breast: Secondary | ICD-10-CM

## 2014-07-22 DIAGNOSIS — F32A Depression, unspecified: Secondary | ICD-10-CM

## 2014-07-22 DIAGNOSIS — Z1211 Encounter for screening for malignant neoplasm of colon: Secondary | ICD-10-CM

## 2014-07-22 DIAGNOSIS — Z1159 Encounter for screening for other viral diseases: Secondary | ICD-10-CM

## 2014-07-22 DIAGNOSIS — R7303 Prediabetes: Secondary | ICD-10-CM

## 2014-07-22 MED ORDER — GABAPENTIN 300 MG PO CAPS: 300.0000 mg | ORAL_CAPSULE | Freq: Every day | ORAL | Status: DC

## 2014-07-22 MED ORDER — LORAZEPAM 1 MG PO TABS: ORAL_TABLET | ORAL | Status: DC

## 2014-07-22 MED ORDER — ERGOCALCIFEROL 1.25 MG (50000 UT) PO CAPS
50000.0000 [IU] | ORAL_CAPSULE | ORAL | Status: DC
Start: 2014-07-22 — End: 2015-03-08

## 2014-07-22 NOTE — Progress Notes (Signed)
Subjective:    Patient ID: Laurie Olson, female    DOB: [birth year], 54 y.o.   MRN: [id number]  HPI The PT is here for follow up and re-evaluation of chronic medical conditions, medication management and review of any available recent lab and radiology data.  Preventive health is updated, specifically  Cancer screening and Immunization.   Questions or concerns regarding consultations or procedures which the PT has had in the interim are  Addressed.Sgtill needs imaging studies ordered previously as well as her mammogram and colonoscopy The PT denies any adverse reactions to current medications since the last visit.  There are no new concerns.  Still very depressed and withdrawn, now willing to see therapist    Review of Systems See HPI Denies recent fever or chills. Denies sinus pressure, nasal congestion, ear pain or sore throat. Denies chest congestion, productive cough or wheezing. Denies chest pains, palpitations and leg swelling Denies abdominal pain, nausea, vomiting,diarrhea or constipation.   Denies dysuria, frequency, hesitancy or incontinence. Chronic  joint pain,and limitation in mobility.with upper and lower extremity numbness Denies headaches, seizures, numbness, or tingling. Denies depression, anxiety or insomnia. Denies skin break down or rash.        Objective:   Physical Exam BP 130/90 mmHg  Pulse 60  Resp 18  Ht 5\' 6"  (1.676 m)  Wt 214 lb 1.9 oz (97.124 kg)  BMI 34.58 kg/m2  SpO2 99%  LMP 07/09/2010  Patient alert and oriented and in no cardiopulmonary distress.  HEENT: No facial asymmetry, EOMI,   oropharynx pink and moist.  Neck supple no JVD, no mass.  Chest: Clear to auscultation bilaterally.  CVS: S1, S2 no murmurs, no S3.Regular rate.  ABD: Soft non tender.   Ext: No edema  MS: decreased ROM spine, shoulders, hips and knees.  Skin: Intact, no ulcerations or rash noted.  Psych: Good eye contact, normal affect. Memory intact tearful,  anxious and  depressed appearing.  CNS: CN 2-12 intact,         Assessment & Plan:  Essential hypertension Marked improvement, no med chane DASH diet and commitment to daily physical activity for a minimum of 30 minutes discussed and encouraged, as a part of hypertension management. The importance of attaining a healthy weight is also discussed.  BP/Weight 07/22/2014 06/30/2014 06/30/2014 06/17/2014 07/15/2013 10/16/2012 05/30/2012  Systolic BP 130 - - 160 124 126 140  Diastolic BP 90 - - 100 84 84 100  Wt. (Lbs) 214.12 210 210 218.8 213.12 215 210.12  BMI 34.58 33.91 33.91 35.33 34.42 34.72 33.93        Prediabetes Deteriorated Patient educated about the importance of limiting  Carbohydrate intake , the need to commit to daily physical activity for a minimum of 30 minutes , and to commit weight loss. The fact that changes in all these areas will reduce or eliminate all together the development of diabetes is stressed.   Diabetic Labs Latest Ref Rng 06/17/2014 07/15/2013 05/30/2012 08/22/2010  HbA1c <5.7 % 5.8(H) 5.7(H) 5.9(H) -  Chol 0 - 200 mg/dL 409(W247(H) 119(J269(H) 478(G203(H) 956(O248(H)  HDL >=46 mg/dL 56 58 70 61  Calc LDL 0 - 99 mg/dL 130(Q164(H) 657(Q179(H) 469(G113(H) 295(M169(H)  Triglycerides <150 mg/dL 841136 324(M160(H) 99 89  Creatinine 0.50 - 1.10 mg/dL 0.100.78 2.720.74 5.360.76 6.440.80   BP/Weight 07/22/2014 06/30/2014 06/30/2014 06/17/2014 07/15/2013 10/16/2012 05/30/2012  Systolic BP 130 - - 160 124 126 140  Diastolic BP 90 - - 100 84 84 100  Wt. (Lbs) 214.12  210 210 218.8 213.12 215 210.12  BMI 34.58 33.91 33.91 35.33 34.42 34.72 33.93   No flowsheet data found.     Morbid obesity IMproved, pt  applauded Patient re-educated about  the importance of commitment to a  minimum of 150 minutes of exercise per week.  The importance of healthy food choices with portion control discussed. Encouraged to start a food diary, count calories and to consider  joining a support group. Sample diet sheets offered. Goals set by the patient for  the next several months.   Weight /BMI 07/22/2014 06/30/2014 06/30/2014  WEIGHT 214 lb 1.9 oz 210 lb 210 lb  HEIGHT     BMI 34.58 kg/m2 33.91 kg/m2 33.91 kg/m2    Current exercise per week 90 minutes.   Depression Pt now agrees to see therapist which will help aloit , not much response to medication but has taken inconsisitently will change this  Cervical neck pain with evidence of disc disease Still has upper extremity symptoms, encouraged to follow through with imaging studies  Metabolic syndrome X The increased risk of cardiovascular disease associated with this diagnosis, and the need to consistently work on lifestyle to change this is discussed. Following  a  heart healthy diet ,commitment to 30 minutes of exercise at least 5 days per week, as well as control of blood sugar and cholesterol , and achieving a healthy weight are all the areas to be addressed .

## 2014-07-22 NOTE — Patient Instructions (Signed)
F/u in 3.5 month, call if you need me before  HBA1C, fasting lipid, cmp and hIV in 3.5 month  Keep appts for colonoscopy, mammogram and imaging studies    You are referred to therapist  Increase gabapentin to 300mg  every night, OK to take three 100mg  capsules till done   Weekly vit D for 1 year  Please work on good  health habits so that your health will improve. 1. Commitment to daily physical activity for 30 to 60  minutes, if you are able to do this.  2. Commitment to wise food choices. Aim for half of your  food intake to be vegetable and fruit, one quarter starchy foods, and one quarter protein. Try to eat on a regular schedule  3 meals per day, snacking between meals should be limited to vegetables or fruits or small portions of nuts. 64 ounces of water per day is generally recommended, unless you have specific health conditions, like heart failure or kidney failure where you will need to limit fluid intake.  3. Commitment to sufficient and a  good quality of physical and mental rest daily, generally between 6 to 8 hours per day.  WITH PERSISTANCE AND PERSEVERANCE, THE IMPOSSIBLE , BECOMES THE NORM!  [facility], we consider it a privelige to serve you.

## 2014-07-23 ENCOUNTER — Encounter (INDEPENDENT_AMBULATORY_CARE_PROVIDER_SITE_OTHER): Payer: Self-pay | Admitting: *Deleted

## 2014-07-23 ENCOUNTER — Telehealth (HOSPITAL_COMMUNITY): Payer: Self-pay | Admitting: *Deleted

## 2014-08-08 DIAGNOSIS — E8881 Metabolic syndrome: Secondary | ICD-10-CM | POA: Insufficient documentation

## 2014-08-08 DIAGNOSIS — N951 Menopausal and female climacteric states: Secondary | ICD-10-CM | POA: Insufficient documentation

## 2014-08-08 NOTE — Assessment & Plan Note (Signed)
The increased risk of cardiovascular disease associated with this diagnosis, and the need to consistently work on lifestyle to change this is discussed. Following  a  heart healthy diet ,commitment to 30 minutes of exercise at least 5 days per week, as well as control of blood sugar and cholesterol , and achieving a healthy weight are all the areas to be addressed .  

## 2014-08-08 NOTE — Assessment & Plan Note (Signed)
IMproved, pt  applauded Patient re-educated about  the importance of commitment to a  minimum of 150 minutes of exercise per week.  The importance of healthy food choices with portion control discussed. Encouraged to start a food diary, count calories and to consider  joining a support group. Sample diet sheets offered. Goals set by the patient for the next several months.   Weight /BMI 07/22/2014 06/30/2014 06/30/2014  WEIGHT 214 lb 1.9 oz 210 lb 210 lb  HEIGHT 5\' 6"  5\' 6"  5\' 6"   BMI 34.58 kg/m2 33.91 kg/m2 33.91 kg/m2    Current exercise per week 90 minutes.

## 2014-08-08 NOTE — Assessment & Plan Note (Signed)
Behavioral modification and start efexor

## 2014-08-08 NOTE — Assessment & Plan Note (Signed)
Deteriorated Patient educated about the importance of limiting  Carbohydrate intake , the need to commit to daily physical activity for a minimum of 30 minutes , and to commit weight loss. The fact that changes in all these areas will reduce or eliminate all together the development of diabetes is stressed.   Diabetic Labs Latest Ref Rng 06/17/2014 07/15/2013 05/30/2012 08/22/2010  HbA1c <5.7 % 5.8(H) 5.7(H) 5.9(H) -  Chol 0 - 200 mg/dL 161(W) 960(A) 540(J) 811(B)  HDL >=46 mg/dL 56 58 70 61  Calc LDL 0 - 99 mg/dL 147(W) 295(A) 213(Y) 865(H)  Triglycerides <150 mg/dL 846 962(X) 99 89  Creatinine 0.50 - 1.10 mg/dL 5.28 4.13 2.44 0.10   BP/Weight 07/22/2014 06/30/2014 06/30/2014 06/17/2014 07/15/2013 10/16/2012 05/30/2012  Systolic BP 130 - - 160 124 126 140  Diastolic BP 90 - - 100 84 84 100  Wt. (Lbs) 214.12 210 210 218.8 213.12 215 210.12  BMI 34.58 33.91 33.91 35.33 34.42 34.72 33.93   No flowsheet data found.

## 2014-08-08 NOTE — Assessment & Plan Note (Signed)
Pt now agrees to see therapist which will help aloit , not much response to medication but has taken inconsisitently will change this

## 2014-08-08 NOTE — Assessment & Plan Note (Signed)
Deteriorated. Patient re-educated about  the importance of commitment to a  minimum of 150 minutes of exercise per week.  The importance of healthy food choices with portion control discussed. Encouraged to start a food diary, count calories and to consider  joining a support group. Sample diet sheets offered. Goals set by the patient for the next several months.   Weight /BMI 07/22/2014 06/30/2014 06/30/2014  WEIGHT 214 lb 1.9 oz 210 lb 210 lb  HEIGHT 5\' 6"  5\' 6"  5\' 6"   BMI 34.58 kg/m2 33.91 kg/m2 33.91 kg/m2    Current exercise per week 100 minutes.

## 2014-08-08 NOTE — Assessment & Plan Note (Signed)
Marked improvement, no med chane DASH diet and commitment to daily physical activity for a minimum of 30 minutes discussed and encouraged, as a part of hypertension management. The importance of attaining a healthy weight is also discussed.  BP/Weight 07/22/2014 06/30/2014 06/30/2014 06/17/2014 07/15/2013 10/16/2012 05/30/2012  Systolic BP 130 - - 160 124 126 140  Diastolic BP 90 - - 100 84 84 100  Wt. (Lbs) 214.12 210 210 218.8 213.12 215 210.12  BMI 34.58 33.91 33.91 35.33 34.42 34.72 33.93

## 2014-08-08 NOTE — Assessment & Plan Note (Signed)
Severe neck pain radiaiting to upper extremities with weakness, anti inflammatories and gabapentin, MRI needed

## 2014-08-08 NOTE — Assessment & Plan Note (Signed)
Uncontrolled, start medication DASH diet and commitment to daily physical activity for a minimum of 30 minutes discussed and encouraged, as a part of hypertension management. The importance of attaining a healthy weight is also discussed.  BP/Weight 07/22/2014 06/30/2014 06/30/2014 06/17/2014 07/15/2013 10/16/2012 05/30/2012  Systolic BP 130 - - 160 124 126 140  Diastolic BP 90 - - 100 84 84 100  Wt. (Lbs) 214.12 210 210 218.8 213.12 215 210.12  BMI 34.58 33.91 33.91 35.33 34.42 34.72 33.93

## 2014-08-08 NOTE — Assessment & Plan Note (Signed)
Still has upper extremity symptoms, encouraged to follow through with imaging studies

## 2014-08-08 NOTE — Assessment & Plan Note (Signed)
Not suicidal or homicidal however,very poor level of function, start medication which will also assist hot flashes and commit to daily physical activity , f/u in 6 weeks

## 2014-08-10 ENCOUNTER — Ambulatory Visit (HOSPITAL_COMMUNITY): Payer: BLUE CROSS/BLUE SHIELD

## 2014-08-10 ENCOUNTER — Telehealth (HOSPITAL_COMMUNITY): Payer: Self-pay | Admitting: *Deleted

## 2014-08-11 ENCOUNTER — Telehealth (HOSPITAL_COMMUNITY): Payer: Self-pay | Admitting: *Deleted

## 2014-08-29 ENCOUNTER — Other Ambulatory Visit: Payer: BLUE CROSS/BLUE SHIELD

## 2014-08-29 ENCOUNTER — Inpatient Hospital Stay: Admission: RE | Admit: 2014-08-29 | Payer: BLUE CROSS/BLUE SHIELD | Source: Ambulatory Visit

## 2014-09-07 ENCOUNTER — Ambulatory Visit (HOSPITAL_COMMUNITY): Payer: BLUE CROSS/BLUE SHIELD

## 2014-12-21 ENCOUNTER — Encounter: Payer: BLUE CROSS/BLUE SHIELD | Admitting: Family Medicine

## 2014-12-23 ENCOUNTER — Other Ambulatory Visit: Payer: Self-pay

## 2014-12-23 MED ORDER — METOPROLOL SUCCINATE ER 25 MG PO TB24: 25.0000 mg | ORAL_TABLET | Freq: Every day | ORAL | Status: DC

## 2015-03-04 ENCOUNTER — Encounter: Payer: Self-pay | Admitting: Family Medicine

## 2015-03-04 ENCOUNTER — Ambulatory Visit (INDEPENDENT_AMBULATORY_CARE_PROVIDER_SITE_OTHER): Payer: BLUE CROSS/BLUE SHIELD | Admitting: Family Medicine

## 2015-03-04 VITALS — BP 128/64 | HR 82 | Resp 18 | Ht 66.0 in | Wt 215.0 lb

## 2015-03-04 DIAGNOSIS — R7303 Prediabetes: Secondary | ICD-10-CM

## 2015-03-04 DIAGNOSIS — Z1211 Encounter for screening for malignant neoplasm of colon: Secondary | ICD-10-CM | POA: Diagnosis not present

## 2015-03-04 DIAGNOSIS — I10 Essential (primary) hypertension: Secondary | ICD-10-CM | POA: Diagnosis not present

## 2015-03-04 DIAGNOSIS — Z23 Encounter for immunization: Secondary | ICD-10-CM

## 2015-03-04 DIAGNOSIS — Z114 Encounter for screening for human immunodeficiency virus [HIV]: Secondary | ICD-10-CM

## 2015-03-04 DIAGNOSIS — E559 Vitamin D deficiency, unspecified: Secondary | ICD-10-CM

## 2015-03-04 DIAGNOSIS — E785 Hyperlipidemia, unspecified: Secondary | ICD-10-CM

## 2015-03-04 DIAGNOSIS — E8881 Metabolic syndrome: Secondary | ICD-10-CM

## 2015-03-04 DIAGNOSIS — Z1159 Encounter for screening for other viral diseases: Secondary | ICD-10-CM

## 2015-03-04 MED ORDER — PREDNISONE 5 MG (21) PO TBPK: ORAL_TABLET | ORAL | Status: DC

## 2015-03-04 NOTE — Patient Instructions (Signed)
F/u in 6 month, call if you need me sooner  You NEED to schedule a mammogram , this is 3.5 years since last one!  Labs today  Please work on good  health habits so that your health will improve. 1. Commitment to daily physical activity for 30 to 60  minutes, if you are able to do this.  2. Commitment to wise food choices. Aim for half of your  food intake to be vegetable and fruit, one quarter starchy foods, and one quarter protein. Try to eat on a regular schedule  3 meals per day, snacking between meals should be limited to vegetables or fruits or small portions of nuts. 64 ounces of water per day is generally recommended, unless you have specific health conditions, like heart failure or kidney failure where you will need to limit fluid intake.  3. Commitment to sufficient and a  good quality of physical and mental rest daily, generally between 6 to 8 hours per day.  WITH PERSISTANCE AND PERSEVERANCE, THE IMPOSSIBLE , BECOMES THE NORM!   Thanks for choosing [facility], we consider it a privelige to serve you.  All the best for 2017!  Rectal exam shows no blood in stool

## 2015-03-04 NOTE — Progress Notes (Signed)
   Subjective:    Patient ID: Laurie Olson, female    DOB: [birth year], 55 y.o.   MRN: [id number]  HPI   Laurie Bornansy D Martinez     MRN: [id number]      DOB: [birth year]   HPI Ms. Myrtis HoppingMcNair is here for follow up and re-evaluation of chronic medical conditions, medication management and review of any available recent lab and radiology data.  Preventive health is updated, specifically  Cancer screening and Immunization.   Questions or concerns regarding consultations or procedures which the PT has had in the interim are  addressed. The PT denies any adverse reactions to current medications since the last visit.  There are no new concerns.  Here for age appropriate preventive exam for work Still needs colonoscopy, denies change in BM, no f/h of colon cancer. Mammogram past due , she will schedule,Flu vaccine administered at visit ROS Denies recent fever or chills. Denies sinus pressure, nasal congestion, ear pain or sore throat. Denies chest congestion, productive cough or wheezing. Denies chest pains, palpitations and leg swelling Denies abdominal pain, nausea, vomiting,diarrhea or constipation.   Denies dysuria, frequency, hesitancy or incontinence. Denies joint pain, swelling and limitation in mobility. Denies headaches, seizures, numbness, or tingling. Denies depression, anxiety or insomnia. Denies skin break down or rash.   PE  BP 128/64 mmHg  Pulse 82  Resp 18  Ht 5\' 6"  (1.676 m)  Wt 215 lb (97.523 kg)  BMI 34.72 kg/m2  SpO2 98%  LMP 07/09/2010  Patient alert and oriented and in no cardiopulmonary distress.  HEENT: No facial asymmetry, EOMI,   oropharynx pink and moist.  Neck supple no JVD, no mass.  Chest: Clear to auscultation bilaterally. Breast: no mass, no ni[[le inversion, no axillary or supraclavicular nodes  CVS: S1, S2 no murmurs, no S3.Regular rate.  ABD: Soft non tender.No organomegaly or mass, Normal BS Rectal: no mass, heme negative stool  Ext: No edema  MS:  Adequate ROM spine, shoulders, hips and knees.  Skin: Intact, no ulcerations or rash noted.  Psych: Good eye contact, normal affect. Memory intact not anxious or depressed appearing.  CNS: CN 2-12 intact, power,  normal throughout.no focal deficits noted.   Assessment & Plan   Special screening for malignant neoplasms, colon Rectal exam: no mass and heme negative stool  Need for prophylactic vaccination and inoculation against influenza After obtaining informed consent, the vaccine is  administered by LPN.   Hyperlipidemia Hyperlipidemia:Low fat diet discussed and encouraged.   Lipid Panel  Lab Results  Component Value Date   CHOL 191 03/04/2015   HDL 55 03/04/2015   LDLCALC 98 03/04/2015   TRIG 189* 03/04/2015   CHOLHDL 3.5 03/04/2015   Need to reduce fried and fatty foods, no med change     Vitamin D deficiency Needs to commit to weekly vit D       Review of Systems     Objective:   Physical Exam        Assessment & Plan:

## 2015-03-05 LAB — HEMOGLOBIN A1C
HEMOGLOBIN A1C: 5.8 % — AB (ref <5.7)
MEAN PLASMA GLUCOSE: 120 mg/dL — AB (ref <117)

## 2015-03-05 LAB — CBC
HCT: 42 % (ref 36.0–46.0)
Hemoglobin: 14 g/dL (ref 12.0–15.0)
MCH: 29.4 pg (ref 26.0–34.0)
MCHC: 33.3 g/dL (ref 30.0–36.0)
MCV: 88.2 fL (ref 78.0–100.0)
MPV: 10.8 fL (ref 8.6–12.4)
PLATELETS: 232 10*3/uL (ref 150–400)
RBC: 4.76 MIL/uL (ref 3.87–5.11)
RDW: 13.7 % (ref 11.5–15.5)
WBC: 5.2 10*3/uL (ref 4.0–10.5)

## 2015-03-05 LAB — HIV ANTIBODY (ROUTINE TESTING W REFLEX): HIV: NONREACTIVE

## 2015-03-05 LAB — LIPID PANEL
CHOLESTEROL: 191 mg/dL (ref 125–200)
HDL: 55 mg/dL (ref >=46)
LDL Cholesterol: 98 mg/dL (ref <130)
Total CHOL/HDL Ratio: 3.5 Ratio (ref <=5.0)
Triglycerides: 189 mg/dL — ABNORMAL HIGH (ref <150)
VLDL: 38 mg/dL — ABNORMAL HIGH (ref <30)

## 2015-03-05 LAB — HEPATITIS C ANTIBODY: HCV Ab: NEGATIVE

## 2015-03-05 LAB — COMPREHENSIVE METABOLIC PANEL
ALBUMIN: 4.4 g/dL (ref 3.6–5.1)
ALT: 17 U/L (ref 6–29)
AST: 19 U/L (ref 10–35)
Alkaline Phosphatase: 82 U/L (ref 33–130)
BUN: 11 mg/dL (ref 7–25)
CALCIUM: 10.7 mg/dL — AB (ref 8.6–10.4)
CO2: 26 mmol/L (ref 20–31)
Chloride: 104 mmol/L (ref 98–110)
Creat: 0.69 mg/dL (ref 0.50–1.05)
Glucose, Bld: 91 mg/dL (ref 65–99)
Potassium: 3.8 mmol/L (ref 3.5–5.3)
Sodium: 137 mmol/L (ref 135–146)
TOTAL PROTEIN: 7.3 g/dL (ref 6.1–8.1)
Total Bilirubin: 0.7 mg/dL (ref 0.2–1.2)

## 2015-03-05 LAB — VITAMIN D 25 HYDROXY (VIT D DEFICIENCY, FRACTURES): VIT D 25 HYDROXY: 29 ng/mL — AB (ref 30–100)

## 2015-03-07 DIAGNOSIS — Z1211 Encounter for screening for malignant neoplasm of colon: Secondary | ICD-10-CM | POA: Insufficient documentation

## 2015-03-07 DIAGNOSIS — Z23 Encounter for immunization: Secondary | ICD-10-CM | POA: Insufficient documentation

## 2015-03-07 NOTE — Assessment & Plan Note (Signed)
After obtaining informed consent, the vaccine is  administered by LPN.  

## 2015-03-07 NOTE — Assessment & Plan Note (Signed)
Hyperlipidemia:Low fat diet discussed and encouraged.   Lipid Panel  Lab Results  Component Value Date   CHOL 191 03/04/2015   HDL 55 03/04/2015   LDLCALC 98 03/04/2015   TRIG 189* 03/04/2015   CHOLHDL 3.5 03/04/2015   Need to reduce fried and fatty foods, no med change

## 2015-03-07 NOTE — Assessment & Plan Note (Signed)
Rectal exam: no mass and heme negative stool 

## 2015-03-07 NOTE — Assessment & Plan Note (Signed)
Needs to commit to weekly vit D 

## 2015-03-08 ENCOUNTER — Other Ambulatory Visit: Payer: Self-pay

## 2015-03-08 DIAGNOSIS — M5410 Radiculopathy, site unspecified: Secondary | ICD-10-CM

## 2015-03-08 DIAGNOSIS — E559 Vitamin D deficiency, unspecified: Secondary | ICD-10-CM

## 2015-03-08 LAB — POC HEMOCCULT BLD/STL (OFFICE/1-CARD/DIAGNOSTIC): Fecal Occult Blood, POC: NEGATIVE

## 2015-03-08 MED ORDER — GABAPENTIN 300 MG PO CAPS: 300.0000 mg | ORAL_CAPSULE | Freq: Every day | ORAL | Status: DC

## 2015-03-08 MED ORDER — METOPROLOL SUCCINATE ER 25 MG PO TB24: 25.0000 mg | ORAL_TABLET | Freq: Every day | ORAL | Status: DC

## 2015-03-08 MED ORDER — AMLODIPINE BESYLATE 10 MG PO TABS: 10.0000 mg | ORAL_TABLET | Freq: Every day | ORAL | Status: DC

## 2015-03-08 MED ORDER — PRAVASTATIN SODIUM 40 MG PO TABS: ORAL_TABLET | ORAL | Status: DC

## 2015-03-08 MED ORDER — ERGOCALCIFEROL 1.25 MG (50000 UT) PO CAPS: 50000.0000 [IU] | ORAL_CAPSULE | ORAL | Status: DC

## 2015-03-08 NOTE — Addendum Note (Signed)
Addended by: Kandis FantasiaSLADE, COURTNEY B on: 03/08/2015 10:50 AM   Modules accepted: Orders

## 2015-04-07 ENCOUNTER — Telehealth: Payer: Self-pay

## 2015-04-07 DIAGNOSIS — R21 Rash and other nonspecific skin eruption: Secondary | ICD-10-CM

## 2015-04-07 NOTE — Telephone Encounter (Signed)
Attempted to call twice left a message once , I have referred her to dermatology, pls let her know if she calls back, I did not leave that on the message but asked her to try to speak with me, no response as of 5:56 today

## 2015-04-07 NOTE — Progress Notes (Signed)
   Subjective:    Patient ID: Laurie Olson, female    DOB: 26-Dec-1960, 55 y.o.   MRN: [id number]  HPI  One month after visit, pt called in with c/o rash on arms and legs which had not responded to prednisone which had been prescribed  Review of Systems     Objective:   Physical Exam        Assessment & Plan:  Pruritic rash, x 1 month, nop response to prednisone , referred to dermatology

## 2015-04-09 ENCOUNTER — Telehealth: Payer: Self-pay | Admitting: Family Medicine

## 2015-04-09 MED ORDER — PREDNISONE 5 MG PO TABS: 5.0000 mg | ORAL_TABLET | Freq: Two times a day (BID) | ORAL | Status: AC

## 2015-04-09 MED ORDER — HYDROXYZINE HCL 50 MG PO TABS: 50.0000 mg | ORAL_TABLET | Freq: Three times a day (TID) | ORAL | Status: DC | PRN

## 2015-04-09 NOTE — Telephone Encounter (Signed)
Two meds sent, pls explain that hydroxyzine may make her sleepy , so no driving or using equipment with this

## 2015-04-09 NOTE — Telephone Encounter (Signed)
Left message for patient

## 2015-04-09 NOTE — Telephone Encounter (Signed)
Patient is calling stating that Dr. Orvan Falconer office contacted her to schedule an appointment but they are closed on Fridays and she cant get them back until Monday. She is asking if Dr. Lodema Hong could send her in something for the itch until she can see the Dermatologist, please advise?

## 2015-04-24 ENCOUNTER — Other Ambulatory Visit: Payer: Self-pay | Admitting: Family Medicine

## 2015-05-13 ENCOUNTER — Other Ambulatory Visit: Payer: Self-pay | Admitting: Family Medicine

## 2015-05-18 ENCOUNTER — Telehealth: Payer: Self-pay | Admitting: Family Medicine

## 2015-05-18 NOTE — Telephone Encounter (Signed)
called and left message that she was supposed to be on pravastatin, not generic lipitor and to call back if she had any questions

## 2015-05-18 NOTE — Telephone Encounter (Signed)
Patient is asking is she supposed to be taking the generic Liptor anymore, please advise?

## 2015-06-14 ENCOUNTER — Telehealth: Payer: Self-pay | Admitting: Family Medicine

## 2015-06-14 MED ORDER — BETAMETHASONE DIPROPIONATE 0.05 % EX CREA: TOPICAL_CREAM | Freq: Two times a day (BID) | CUTANEOUS | Status: DC

## 2015-06-14 NOTE — Telephone Encounter (Signed)
Pls let her knwo if she really thinks so , then stop the hydroxyzine, however, I do not believe this is the case.Would be over her entire body  I recommend betamethasone cream to rash and have sent it in

## 2015-06-14 NOTE — Telephone Encounter (Signed)
Called patient and left message for them to return call at the office   

## 2015-06-14 NOTE — Telephone Encounter (Signed)
Laurie Olson left a voicemail stating that she thinks that she is having side effects from the medication hydrOXYzine (ATARAX/VISTARIL) 50 MG tablet please advise?

## 2015-06-14 NOTE — Telephone Encounter (Signed)
Patient states that she has rash to both arms x 3 weeks.  Rash itches and is fine bumps. She has not changed any products or medications.   Has tried otc benadryl and hydrocortisone cream with no relief.   Please advise.

## 2015-06-14 NOTE — Telephone Encounter (Signed)
Pt aware.

## 2015-09-08 ENCOUNTER — Ambulatory Visit: Payer: BLUE CROSS/BLUE SHIELD | Admitting: Family Medicine

## 2015-09-08 ENCOUNTER — Encounter: Payer: Self-pay | Admitting: Family Medicine

## 2015-10-25 ENCOUNTER — Other Ambulatory Visit: Payer: Self-pay | Admitting: Family Medicine

## 2015-11-03 ENCOUNTER — Other Ambulatory Visit: Payer: Self-pay | Admitting: Family Medicine

## 2015-11-05 ENCOUNTER — Other Ambulatory Visit: Payer: Self-pay | Admitting: Family Medicine

## 2015-12-13 ENCOUNTER — Other Ambulatory Visit: Payer: Self-pay | Admitting: Family Medicine

## 2015-12-16 ENCOUNTER — Other Ambulatory Visit: Payer: Self-pay

## 2015-12-16 MED ORDER — PRAVASTATIN SODIUM 40 MG PO TABS: ORAL_TABLET | ORAL | 5 refills | Status: DC

## 2016-02-14 ENCOUNTER — Telehealth: Payer: Self-pay | Admitting: Family Medicine

## 2016-02-14 ENCOUNTER — Other Ambulatory Visit: Payer: Self-pay

## 2016-02-14 ENCOUNTER — Other Ambulatory Visit: Payer: Self-pay | Admitting: Family Medicine

## 2016-02-14 MED ORDER — AMLODIPINE BESYLATE 10 MG PO TABS: 10.0000 mg | ORAL_TABLET | Freq: Every day | ORAL | 0 refills | Status: DC

## 2016-02-14 NOTE — Telephone Encounter (Signed)
Laurie Olson is asking if she is supposed to be still taking amLODipine (NORVASC) 10 MG tablet she is not sure if she is supposed to be on this medication or not, please advise?

## 2016-02-14 NOTE — Telephone Encounter (Signed)
Med refilled.  Patient needs ov.  Will also send letter to call and schedule appt

## 2016-02-14 NOTE — Telephone Encounter (Signed)
Called and left message for patient to return call.  

## 2016-03-07 ENCOUNTER — Other Ambulatory Visit: Payer: Self-pay | Admitting: Family Medicine

## 2016-03-07 DIAGNOSIS — M5410 Radiculopathy, site unspecified: Secondary | ICD-10-CM

## 2016-03-07 DIAGNOSIS — E559 Vitamin D deficiency, unspecified: Secondary | ICD-10-CM

## 2016-03-21 ENCOUNTER — Other Ambulatory Visit: Payer: Self-pay | Admitting: Family Medicine

## 2016-03-23 ENCOUNTER — Ambulatory Visit: Payer: BLUE CROSS/BLUE SHIELD | Admitting: Family Medicine

## 2016-03-23 ENCOUNTER — Other Ambulatory Visit: Payer: Self-pay

## 2016-03-23 ENCOUNTER — Telehealth: Payer: Self-pay | Admitting: Family Medicine

## 2016-03-23 DIAGNOSIS — E559 Vitamin D deficiency, unspecified: Secondary | ICD-10-CM

## 2016-03-23 MED ORDER — ERGOCALCIFEROL 1.25 MG (50000 UT) PO CAPS: 50000.0000 [IU] | ORAL_CAPSULE | ORAL | 5 refills | Status: DC

## 2016-03-23 NOTE — Telephone Encounter (Signed)
[facility] is asking for a refill on ergocalciferol (VITAMIN D2) 50000 units capsule  Please advise?

## 2016-03-23 NOTE — Telephone Encounter (Signed)
Med refilled.

## 2016-05-15 ENCOUNTER — Ambulatory Visit (INDEPENDENT_AMBULATORY_CARE_PROVIDER_SITE_OTHER): Payer: BLUE CROSS/BLUE SHIELD | Admitting: Family Medicine

## 2016-05-15 ENCOUNTER — Other Ambulatory Visit: Payer: Self-pay | Admitting: Family Medicine

## 2016-05-15 ENCOUNTER — Encounter (INDEPENDENT_AMBULATORY_CARE_PROVIDER_SITE_OTHER): Payer: Self-pay | Admitting: *Deleted

## 2016-05-15 ENCOUNTER — Other Ambulatory Visit (HOSPITAL_COMMUNITY)
Admission: RE | Admit: 2016-05-15 | Discharge: 2016-05-15 | Disposition: A | Discharge status: Home / Self Care | Payer: BLUE CROSS/BLUE SHIELD | Source: Ambulatory Visit | Attending: Family Medicine | Admitting: Family Medicine

## 2016-05-15 VITALS — BP 130/84 | HR 64 | Resp 15 | Ht 66.0 in | Wt 219.0 lb

## 2016-05-15 DIAGNOSIS — Z1211 Encounter for screening for malignant neoplasm of colon: Secondary | ICD-10-CM | POA: Diagnosis not present

## 2016-05-15 DIAGNOSIS — Z1231 Encounter for screening mammogram for malignant neoplasm of breast: Secondary | ICD-10-CM

## 2016-05-15 DIAGNOSIS — R7303 Prediabetes: Secondary | ICD-10-CM | POA: Diagnosis not present

## 2016-05-15 DIAGNOSIS — Z124 Encounter for screening for malignant neoplasm of cervix: Secondary | ICD-10-CM | POA: Diagnosis not present

## 2016-05-15 DIAGNOSIS — I10 Essential (primary) hypertension: Secondary | ICD-10-CM

## 2016-05-15 DIAGNOSIS — Z1239 Encounter for other screening for malignant neoplasm of breast: Secondary | ICD-10-CM

## 2016-05-15 DIAGNOSIS — M5090 Cervical disc disorder, unspecified, unspecified cervical region: Secondary | ICD-10-CM | POA: Diagnosis not present

## 2016-05-15 DIAGNOSIS — E785 Hyperlipidemia, unspecified: Secondary | ICD-10-CM | POA: Diagnosis not present

## 2016-05-15 DIAGNOSIS — Z01419 Encounter for gynecological examination (general) (routine) without abnormal findings: Secondary | ICD-10-CM | POA: Insufficient documentation

## 2016-05-15 DIAGNOSIS — Z0000 Encounter for general adult medical examination without abnormal findings: Secondary | ICD-10-CM

## 2016-05-15 LAB — CBC
HCT: 40.8 % (ref 35.0–45.0)
HEMOGLOBIN: 13.5 g/dL (ref 11.7–15.5)
MCH: 30.1 pg (ref 27.0–33.0)
MCHC: 33.1 g/dL (ref 32.0–36.0)
MCV: 91.1 fL (ref 80.0–100.0)
MPV: 9.9 fL (ref 7.5–12.5)
PLATELETS: 210 10*3/uL (ref 140–400)
RBC: 4.48 MIL/uL (ref 3.80–5.10)
RDW: 14.1 % (ref 11.0–15.0)
WBC: 3.6 10*3/uL — ABNORMAL LOW (ref 3.8–10.8)

## 2016-05-15 LAB — COMPLETE METABOLIC PANEL WITH GFR
ALT: 22 U/L (ref 6–29)
AST: 24 U/L (ref 10–35)
Albumin: 4.3 g/dL (ref 3.6–5.1)
Alkaline Phosphatase: 61 U/L (ref 33–130)
BUN: 10 mg/dL (ref 7–25)
CHLORIDE: 105 mmol/L (ref 98–110)
CO2: 26 mmol/L (ref 20–31)
Calcium: 10.3 mg/dL (ref 8.6–10.4)
Creat: 0.66 mg/dL (ref 0.50–1.05)
GFR, Est Non African American: >89 mL/min (ref >=60)
Glucose, Bld: 86 mg/dL (ref 65–99)
Potassium: 4.1 mmol/L (ref 3.5–5.3)
Sodium: 141 mmol/L (ref 135–146)
Total Bilirubin: 0.9 mg/dL (ref 0.2–1.2)
Total Protein: 7.3 g/dL (ref 6.1–8.1)

## 2016-05-15 LAB — LIPID PANEL
Cholesterol: 171 mg/dL (ref <200)
HDL: 63 mg/dL (ref >50)
LDL CALC: 81 mg/dL (ref <100)
TRIGLYCERIDES: 136 mg/dL (ref <150)
Total CHOL/HDL Ratio: 2.7 Ratio (ref <5.0)
VLDL: 27 mg/dL (ref <30)

## 2016-05-15 LAB — TSH: TSH: 0.26 m[IU]/L — AB

## 2016-05-15 LAB — POC HEMOCCULT BLD/STL (OFFICE/1-CARD/DIAGNOSTIC): FECAL OCCULT BLD: NEGATIVE

## 2016-05-15 MED ORDER — ACETAMINOPHEN ER 650 MG PO TBCR: EXTENDED_RELEASE_TABLET | ORAL | 5 refills | Status: DC

## 2016-05-15 MED ORDER — AMLODIPINE BESYLATE 10 MG PO TABS: 10.0000 mg | ORAL_TABLET | Freq: Every day | ORAL | 3 refills | Status: DC

## 2016-05-15 MED ORDER — CYCLOBENZAPRINE HCL 10 MG PO TABS: 10.0000 mg | ORAL_TABLET | Freq: Every day | ORAL | 5 refills | Status: DC

## 2016-05-15 NOTE — Patient Instructions (Addendum)
f/u in 6 month, call if you need me before  PLEASE schedule your mammogram after you leave here today  Please call Dr Olevia Perches office to schedule your colonoscopy  Fasting CBC, lipid, cmp and eGFR, HBA1C, TSH,   Nine pound weight loss goal!  PLEASE cut out sodas, to reduce chance of becoming diabetic  It is important that you exercise regularly at least 30 minutes 5 times a week. If you develop chest pain, have severe difficulty breathing, or feel very tired, stop exercising immediately and seek medical attention   Start muscle relaxant at bedtime for neck spasm and also ES tylenol  Please work on good  health habits so that your health will improve. 1. Commitment to daily physical activity for 30 to 60  minutes, if you are able to do this.  2. Commitment to wise food choices. Aim for half of your  food intake to be vegetable and fruit, one quarter starchy foods, and one quarter protein. Try to eat on a regular schedule  3 meals per day, snacking between meals should be limited to vegetables or fruits or small portions of nuts. 64 ounces of water per day is generally recommended, unless you have specific health conditions, like heart failure or kidney failure where you will need to limit fluid intake.  3. Commitment to sufficient and a  good quality of physical and mental rest daily, generally between 6 to 8 hours per day.  WITH PERSISTANCE AND PERSEVERANCE, THE IMPOSSIBLE , BECOMES THE NORM! Thank you  for choosing [facility]. We consider it a privelige to serve you.  Delivering excellent health care in a caring and  compassionate way is our goal.  Partnering with you,  so that together we can achieve this goal is our strategy.

## 2016-05-16 ENCOUNTER — Encounter: Payer: Self-pay | Admitting: Family Medicine

## 2016-05-16 LAB — HEMOGLOBIN A1C
Hgb A1c MFr Bld: 5.4 % (ref <5.7)
MEAN PLASMA GLUCOSE: 108 mg/dL

## 2016-05-16 NOTE — Assessment & Plan Note (Signed)
c/o left neck spasm and pain, start bedtime muscle relaxant and tylenol , commit to neck exercises

## 2016-05-16 NOTE — Assessment & Plan Note (Signed)
Annual exam as documented. Counseling done  re healthy lifestyle involving commitment to 150 minutes exercise per week, heart healthy diet, and attaining healthy weight.The importance of adequate sleep also discussed. Changes in health habits are decided on by the patient with goals and time frames  set for achieving them. Immunization and cancer screening needs are specifically addressed at this visit. 

## 2016-05-16 NOTE — Progress Notes (Signed)
    Laurie Olson     MRN: [id number]      DOB: 09-20-1960  HPI: Patient is in for annual physical exam Labs needed and will be done on day of visit Needs mammogram and colonoscopy and is commiting to getting both done c/o neck pain and spasm which is ongoing problem Immunization is reviewed , and  updated if needed.   PE: BP 130/84   Pulse 64   Resp 15   Ht 5\' 6"  (1.676 m)   Wt 219 lb (99.3 kg)   LMP 07/09/2010   SpO2 100%   BMI 35.35 kg/m   Pleasant  female, alert and oriented x 3, in no cardio-pulmonary distress. Afebrile. HEENT No facial trauma or asymetry. Sinuses non tender.  Extra occullar muscles intact, pupils equally reactive to light. External ears normal, tympanic membranes clear. Oropharynx moist, no exudate. Neck: decreased ROM with right trapezius spasm, no adenopathy,JVD or thyromegaly.No bruits.  Chest: Clear to ascultation bilaterally.No crackles or wheezes. Non tender to palpation  Breast: No asymetry,no masses or lumps. No tenderness. No nipple discharge or inversion. No axillary or supraclavicular adenopathy  Cardiovascular system; Heart sounds normal,  S1 and  S2 ,no S3.  No murmur, or thrill. Apical beat not displaced Peripheral pulses normal.  Abdomen: Soft, non tender, no organomegaly or masses. No bruits. Bowel sounds normal. No guarding, tenderness or rebound.  Rectal:  Normal sphincter tone. No rectal mass. Guaiac negative stool.  GU: External genitalia normal female genitalia , normal female distribution of hair. No lesions. Urethral meatus normal in size, no  Prolapse, no lesions visibly  Present. Bladder non tender. Vagina pink and moist , with no visible lesions , discharge present . Adequate pelvic support no  cystocele or rectocele noted Cervix pink and appears healthy, no lesions or ulcerations noted, no discharge noted from os Uterus normal size, no adnexal masses, no cervical motion or adnexal  tenderness.   Musculoskeletal exam: Full ROM of spine, hips , shoulders and knees. No deformity ,swelling or crepitus noted. No muscle wasting or atrophy.   Neurologic: Cranial nerves 2 to 12 intact. Power, tone ,sensation and reflexes normal throughout. No disturbance in gait. No tremor.  Skin: Intact, no ulceration, erythema , scaling or rash noted. Pigmentation normal throughout  Psych; Normal mood and affect. Judgement and concentration normal   Assessment & Plan:  Annual physical exam Annual exam as documented. Counseling done  re healthy lifestyle involving commitment to 150 minutes exercise per week, heart healthy diet, and attaining healthy weight.The importance of adequate sleep also discussed.  Changes in health habits are decided on by the patient with goals and time frames  set for achieving them. Immunization and cancer screening needs are specifically addressed at this visit.   Cervical neck pain with evidence of disc disease c/o left neck spasm and pain, start bedtime muscle relaxant and tylenol , commit to neck exercises

## 2016-05-17 LAB — CYTOLOGY - PAP
Adequacy: ABSENT
DIAGNOSIS: NEGATIVE

## 2016-05-19 LAB — T3, FREE: T3, Free: 3.2 pg/mL (ref 2.3–4.2)

## 2016-05-19 LAB — T4, FREE: FREE T4: 1.3 ng/dL (ref 0.8–1.8)

## 2016-05-20 ENCOUNTER — Other Ambulatory Visit: Payer: Self-pay | Admitting: Family Medicine

## 2016-06-01 ENCOUNTER — Other Ambulatory Visit: Payer: Self-pay

## 2016-06-01 MED ORDER — FLUCONAZOLE 150 MG PO TABS: 150.0000 mg | ORAL_TABLET | Freq: Once | ORAL | 0 refills | Status: AC

## 2016-08-28 ENCOUNTER — Other Ambulatory Visit: Payer: Self-pay | Admitting: Family Medicine

## 2016-08-28 NOTE — Telephone Encounter (Signed)
Seen 3 19 18 

## 2016-09-26 ENCOUNTER — Telehealth: Payer: Self-pay | Admitting: Family Medicine

## 2016-09-26 ENCOUNTER — Other Ambulatory Visit: Payer: Self-pay | Admitting: Family Medicine

## 2016-09-26 ENCOUNTER — Telehealth: Payer: Self-pay | Admitting: *Deleted

## 2016-09-26 ENCOUNTER — Other Ambulatory Visit: Payer: Self-pay

## 2016-09-26 DIAGNOSIS — M542 Cervicalgia: Secondary | ICD-10-CM

## 2016-09-26 MED ORDER — METOPROLOL SUCCINATE ER 25 MG PO TB24: 25.0000 mg | ORAL_TABLET | Freq: Every day | ORAL | 1 refills | Status: DC

## 2016-09-26 NOTE — Telephone Encounter (Signed)
Med refilled. Can you please see the rest of the note regarding xray and mammo

## 2016-09-26 NOTE — Telephone Encounter (Signed)
[facility]

## 2016-09-26 NOTE — Telephone Encounter (Signed)
Left message with on patient's voice mail asking her to call office for sooner appt than Sept with Dr Lodema HongSimpson. Also needing to schedule pt's mammogram or give her the number so that she may schedule the mammogram for herself.

## 2016-09-26 NOTE — Telephone Encounter (Signed)
Patient called stating she never could do the mri due to being closterfobic and her back and shoulders are starting to bother her again and she would like an order for a x-ray. Patient also stated she needs a order for a mammogram she hasn't had one done in a year. Patient stated she needs toprol to be refilled. Please advise [phone]

## 2016-09-26 NOTE — Progress Notes (Signed)
Pt has appt with you on  (tuesday) 8-14 @ 3pm And mammogram is set for  (monday) 10-09-16 @ 4:30 Patient is aware of appt times and dates.

## 2016-09-26 NOTE — Progress Notes (Unsigned)
pls either schedule her mammogram or let her know she can schedule this herself, (order still there ) and also let her know x ray of neck is  ordered, I recommend sooner appt in next 2 to 3 weeks since so many problems rather than in September , reschedule if she agrees please

## 2016-10-09 ENCOUNTER — Ambulatory Visit (HOSPITAL_COMMUNITY)
Admission: RE | Admit: 2016-10-09 | Discharge: 2016-10-09 | Disposition: A | Discharge status: Home / Self Care | Payer: BLUE CROSS/BLUE SHIELD | Source: Ambulatory Visit | Attending: Family Medicine | Admitting: Family Medicine

## 2016-10-09 DIAGNOSIS — Z1231 Encounter for screening mammogram for malignant neoplasm of breast: Secondary | ICD-10-CM | POA: Diagnosis present

## 2016-10-09 DIAGNOSIS — Z1239 Encounter for other screening for malignant neoplasm of breast: Secondary | ICD-10-CM

## 2016-10-10 ENCOUNTER — Ambulatory Visit: Payer: BLUE CROSS/BLUE SHIELD | Admitting: Family Medicine

## 2016-10-10 ENCOUNTER — Telehealth: Payer: Self-pay | Admitting: Family Medicine

## 2016-10-10 NOTE — Telephone Encounter (Signed)
Patient calling to let you know that she had her mammogram yesterday. She was unable to keep her appt today with you because her work schedule changed.  She would reschedule but needs an appt after 4pm.  It is difficult for her to get off of work right now with the schedule change.  She is requesting a Rx for shoulder pain because she can't take the Flexeril while working as it makes her sleepy.

## 2016-10-12 NOTE — Telephone Encounter (Signed)
Spoke with patient. She is going to try the tylenol and if that does not work she will make appointment

## 2016-10-12 NOTE — Telephone Encounter (Signed)
Advise her that we do not sched appts after 4 pm so hopefully she will soon be able to work with her job to sched an appt to be evaluated Tylenol for shoulder pain is my recommendation, no prescription medication will be sent in without an evaluation in the office

## 2016-11-06 ENCOUNTER — Ambulatory Visit: Payer: BLUE CROSS/BLUE SHIELD | Admitting: Family Medicine

## 2016-12-26 ENCOUNTER — Ambulatory Visit (INDEPENDENT_AMBULATORY_CARE_PROVIDER_SITE_OTHER): Payer: BLUE CROSS/BLUE SHIELD | Admitting: Family Medicine

## 2016-12-26 ENCOUNTER — Telehealth: Payer: Self-pay | Admitting: Family Medicine

## 2016-12-26 VITALS — BP 132/80 | HR 81 | Temp 98.6°F | Ht 66.0 in | Wt 215.5 lb

## 2016-12-26 DIAGNOSIS — Z1211 Encounter for screening for malignant neoplasm of colon: Secondary | ICD-10-CM

## 2016-12-26 DIAGNOSIS — I10 Essential (primary) hypertension: Secondary | ICD-10-CM | POA: Diagnosis not present

## 2016-12-26 DIAGNOSIS — M722 Plantar fascial fibromatosis: Secondary | ICD-10-CM

## 2016-12-26 MED ORDER — KETOROLAC TROMETHAMINE 60 MG/2ML IM SOLN
60.0000 mg | Freq: Once | INTRAMUSCULAR | Status: AC
  Administered 2016-12-26: 60 mg via INTRAMUSCULAR

## 2016-12-26 MED ORDER — METHYLPREDNISOLONE ACETATE 80 MG/ML IJ SUSP
80.0000 mg | Freq: Once | INTRAMUSCULAR | Status: AC
  Administered 2016-12-26: 80 mg via INTRAMUSCULAR

## 2016-12-26 MED ORDER — PREDNISONE 5 MG (21) PO TBPK: 5.0000 mg | ORAL_TABLET | ORAL | 0 refills | Status: DC

## 2016-12-26 MED ORDER — RANITIDINE HCL 300 MG PO TABS: 300.0000 mg | ORAL_TABLET | Freq: Every day | ORAL | 0 refills | Status: DC

## 2016-12-26 MED ORDER — IBUPROFEN 800 MG PO TABS: 800.0000 mg | ORAL_TABLET | Freq: Three times a day (TID) | ORAL | 1 refills | Status: DC | PRN

## 2016-12-26 NOTE — Patient Instructions (Addendum)
Annual physical exam March 20 or after , call if you need me sooner  Work excuse for today until you see Podiatry , date to be determined at checkout, will fax this to job when we know, pt received date to see Podiatry and turned it down for a later date , so work excuse provided only covered the day of the visit  Toradol 60 mg iM and depo medrol 80 mg iM today for left foot pani  And short courses of ibuprofen and prednisone prescribed , also zantac  Hope you feel better soon  Call back today for appt info for Podiatry if you do not get this at checkout

## 2016-12-26 NOTE — Progress Notes (Signed)
Stevan Bornansy D Maqueda     MRN: [id number]      DOB: 1960-09-24   HPI Ms. Laurie Olson is here for follow up from ED in [place] on 10/24 when she presented with severe left foot pain radiaiting to knee started about 2 days prior after she recalled hitting it on her job on the side of a palate which she has done in the past. Did not report injury on the job. Barely able to weight bear and walk on foot, states her Supervisor considers her a risk on the job and advises her to seek medical attention Hit lateral aspect of foot , no redness, skin breakdown or local tenderness noted there, her pain is on the plantar aspect. Diagnosed with  plantar fascitis, taken out for 2 days, filled no script says [facility] did not have the med  , returned to work limping, states worried about keeping her job,has only been taking regular ibuprofen up to 4 per day, has not filled script provided at the ED ROS Denies recent fever or chills. Denies sinus pressure, nasal congestion, ear pain or sore throat. Denies chest congestion, productive cough or wheezing. Denies chest pains, palpitations and leg swelling Denies abdominal pain, nausea, vomiting,diarrhea or constipation.   Denies dysuria, frequency, hesitancy or incontinence. Denies headaches, seizures, numbness, or tingling. Denies depression, anxiety or insomnia. Denies skin break down or rash.   PE  BP 132/80 (BP Location: Left Arm, Patient Position: Sitting, Cuff Size: Normal)   Pulse 81   Temp 98.6 F (37 C) (Other (Comment))   Ht 5\' 6"  (1.676 m)   Wt 215 lb 8 oz (97.8 kg)   LMP 07/09/2010   BMI 34.78 kg/m   Patient alert and oriented and in no cardiopulmonary distress. Pt in pain, unable to weight bear and walk on affected foot without limitation due to uncontrolled pain  HEENT: No facial asymmetry, EOMI,   oropharynx pink and moist.  Neck supple no JVD, no mass.  Chest: Clear to auscultation bilaterally.  CVS: S1, S2 no murmurs, no S3.Regular rate.  ABD:  Soft non tender.   Ext: No edema  MS: Adequate ROM spine, shoulders, hips and knees. Tender to palpation under plantar aspect of left foot, no warmth, redness , skin breakdown or tenderness noted on lateral aspect of left foot in the area where she suffered direct foot trauma Skin: Intact, no ulcerations or rash noted.  Psych: Good eye contact, normal affect. Memory intact not anxious or depressed appearing.  CNS: CN 2-12 intact, power,  normal throughout.no focal deficits noted.   Assessment & Plan  Plantar fasciitis, left Acute left foot pain. Uncontrolled.Toradol and depo medrol administered IM in the office , to be followed by a short course of oral prednisone and NSAIDS.Referred to podiatry for urgent appt. Written info provided as well as discussed use of topical anti inflammatory ,as in ice  And stretching exercises to alleviate pain Work excuse to covers asap Podiatry appt was the plan , however , pt opted to defer the "soonest available " appt which was provided, so her work excuse covers the day she was seen in the office only   Special screening for malignant neoplasms, colon Colonoscopy remains past due , she is again referred  For this and agrees to follow through  Essential hypertension Controlled, no change in medication DASH diet and commitment to daily physical activity for a minimum of 30 minutes discussed and encouraged, as a part of hypertension management. The importance of  attaining a healthy weight is also discussed.  BP/Weight 12/26/2016 05/15/2016 03/04/2015 07/22/2014 06/30/2014 06/30/2014 06/17/2014  Systolic BP 132 130 128 130 - - 160  Diastolic BP 80 84 64 90 - - 100  Wt. (Lbs) 215.5 219 215 214.12 210 210 218.8  BMI 34.78 35.35 34.72 34.58 33.91 33.91 35.33       Morbid obesity Improved. Patient re-educated about  the importance of commitment to a  minimum of 150 minutes of exercise per week.  The importance of healthy food choices with portion control  discussed. Encouraged to start a food diary, count calories and to consider  joining a support group. Sample diet sheets offered. Goals set by the patient for the next several months.   Weight /BMI 12/26/2016 05/15/2016 03/04/2015  WEIGHT 215 lb 8 oz 219 lb 215 lb  HEIGHT 5\' 6"  5\' 6"  5\' 6"   BMI 34.78 kg/m2 35.35 kg/m2 34.72 kg/m2

## 2016-12-26 NOTE — Telephone Encounter (Signed)
We did received [employer] papers. They are in PCP box to complete. Please advise on out of work

## 2016-12-26 NOTE — Telephone Encounter (Signed)
Cb# [phone]  Patient came back in to ask we have received her fmla papers from [employer], Please let her know. She also wants to know if she is out of work until her podiatry appointment 01/08/17. I made her appointment for the 01/01/17 and she called to r/s to a later date.

## 2016-12-26 NOTE — Telephone Encounter (Signed)
I exp[lained to the pt that the Podiatrist will be in charge of her disability as this ay take a lONG time,I wioll see when she sees the podiatrist

## 2016-12-27 ENCOUNTER — Telehealth: Payer: Self-pay | Admitting: Family Medicine

## 2016-12-27 NOTE — Telephone Encounter (Signed)
Attention: Laurie Olson  Email: [email or web address] Fax #: [phone]  Patient is requesting that her office note from 12/26/16 be faxed/or emailed to above address/#

## 2016-12-28 NOTE — Telephone Encounter (Signed)
Faxed to both numbers.

## 2016-12-28 NOTE — Telephone Encounter (Signed)
I called patient to make her aware, no answer I left a detailed message.  Gary Fleetosha Broadnax called regarding note, I made her aware we will have to talk to the patient. Mickie Bailosha stated they need to know today.    Awaiting on patient to return call.

## 2016-12-28 NOTE — Telephone Encounter (Signed)
Does the patient need to come in to sign a release first?

## 2016-12-28 NOTE — Telephone Encounter (Signed)
Patient came in and signed the release form to release the office note of 12/26/16 to [employer]. Patient request for it to be faxed to both fax numbers [phone] and [phone]

## 2016-12-28 NOTE — Telephone Encounter (Signed)
Can come sign a release and collect the note but cannot fax to employer without signed release

## 2017-01-05 ENCOUNTER — Encounter: Payer: Self-pay | Admitting: Family Medicine

## 2017-01-05 NOTE — Assessment & Plan Note (Signed)
Improved. Patient re-educated about  the importance of commitment to a  minimum of 150 minutes of exercise per week.  The importance of healthy food choices with portion control discussed. Encouraged to start a food diary, count calories and to consider  joining a support group. Sample diet sheets offered. Goals set by the patient for the next several months.   Weight /BMI 12/26/2016 05/15/2016 03/04/2015  WEIGHT 215 lb 8 oz 219 lb 215 lb  HEIGHT 5\' 6"  5\' 6"  5\' 6"   BMI 34.78 kg/m2 35.35 kg/m2 34.72 kg/m2

## 2017-01-05 NOTE — Assessment & Plan Note (Signed)
Colonoscopy remains past due , she is again referred  For this and agrees to follow through

## 2017-01-05 NOTE — Assessment & Plan Note (Signed)
Controlled, no change in medication DASH diet and commitment to daily physical activity for a minimum of 30 minutes discussed and encouraged, as a part of hypertension management. The importance of attaining a healthy weight is also discussed.  BP/Weight 12/26/2016 05/15/2016 03/04/2015 07/22/2014 06/30/2014 06/30/2014 06/17/2014  Systolic BP 132 130 128 130 - - 160  Diastolic BP 80 84 64 90 - - 100  Wt. (Lbs) 215.5 219 215 214.12 210 210 218.8  BMI 34.78 35.35 34.72 34.58 33.91 33.91 35.33

## 2017-01-05 NOTE — Assessment & Plan Note (Signed)
Acute left foot pain. Uncontrolled.Toradol and depo medrol administered IM in the office , to be followed by a short course of oral prednisone and NSAIDS.Referred to podiatry for urgent appt. Written info provided as well as discussed use of topical anti inflammatory ,as in ice  And stretching exercises to alleviate pain Work excuse to covers asap Podiatry appt was the plan , however , pt opted to defer the "soonest available " appt which was provided, so her work excuse covers the day she was seen in the office only

## 2017-02-26 ENCOUNTER — Telehealth: Payer: Self-pay | Admitting: Family Medicine

## 2017-02-26 NOTE — Telephone Encounter (Signed)
She has had this for 3 weeks, taking Cordison,  Coughing, had Fever, chest and back is hurting really bad ... When she leaves work today was planning to go to the ER, but wanted to call DR simpson first...[phone] (cell)

## 2017-02-26 NOTE — Telephone Encounter (Signed)
Needs to go to the eD or urgent care no appts today

## 2017-02-26 NOTE — Telephone Encounter (Signed)
Called patient to advise to go to the Emergency Room

## 2017-04-02 ENCOUNTER — Other Ambulatory Visit: Payer: Self-pay | Admitting: Family Medicine

## 2017-04-02 NOTE — Telephone Encounter (Signed)
Seen 10 30 18 

## 2017-04-21 ENCOUNTER — Other Ambulatory Visit: Payer: Self-pay | Admitting: Family Medicine

## 2017-05-30 ENCOUNTER — Telehealth: Payer: Self-pay | Admitting: Family Medicine

## 2017-05-30 NOTE — Telephone Encounter (Signed)
Patient left a voicemail requesting an earlier appt for a cpe per a work requirement, I called her back to offer her 06/21/17, no answer, left voicemail.

## 2017-06-01 ENCOUNTER — Other Ambulatory Visit: Payer: Self-pay | Admitting: Family Medicine

## 2017-06-21 ENCOUNTER — Ambulatory Visit (INDEPENDENT_AMBULATORY_CARE_PROVIDER_SITE_OTHER): Payer: BLUE CROSS/BLUE SHIELD | Admitting: Family Medicine

## 2017-06-21 ENCOUNTER — Telehealth: Payer: Self-pay

## 2017-06-21 ENCOUNTER — Encounter: Payer: Self-pay | Admitting: Family Medicine

## 2017-06-21 VITALS — BP 122/88 | HR 81 | Resp 16 | Ht 70.0 in | Wt 210.0 lb

## 2017-06-21 DIAGNOSIS — E669 Obesity, unspecified: Secondary | ICD-10-CM

## 2017-06-21 DIAGNOSIS — F32A Depression, unspecified: Secondary | ICD-10-CM

## 2017-06-21 DIAGNOSIS — E8881 Metabolic syndrome: Secondary | ICD-10-CM

## 2017-06-21 DIAGNOSIS — Z1231 Encounter for screening mammogram for malignant neoplasm of breast: Secondary | ICD-10-CM

## 2017-06-21 DIAGNOSIS — E785 Hyperlipidemia, unspecified: Secondary | ICD-10-CM

## 2017-06-21 DIAGNOSIS — E559 Vitamin D deficiency, unspecified: Secondary | ICD-10-CM

## 2017-06-21 DIAGNOSIS — F419 Anxiety disorder, unspecified: Secondary | ICD-10-CM

## 2017-06-21 DIAGNOSIS — Z1211 Encounter for screening for malignant neoplasm of colon: Secondary | ICD-10-CM

## 2017-06-21 DIAGNOSIS — B369 Superficial mycosis, unspecified: Secondary | ICD-10-CM | POA: Diagnosis not present

## 2017-06-21 DIAGNOSIS — Z0000 Encounter for general adult medical examination without abnormal findings: Secondary | ICD-10-CM | POA: Diagnosis not present

## 2017-06-21 DIAGNOSIS — F329 Major depressive disorder, single episode, unspecified: Secondary | ICD-10-CM

## 2017-06-21 DIAGNOSIS — R7303 Prediabetes: Secondary | ICD-10-CM

## 2017-06-21 DIAGNOSIS — I10 Essential (primary) hypertension: Secondary | ICD-10-CM

## 2017-06-21 LAB — CBC
HEMATOCRIT: 42.2 % (ref 35.0–45.0)
HEMOGLOBIN: 14.5 g/dL (ref 11.7–15.5)
MCH: 30 pg (ref 27.0–33.0)
MCHC: 34.4 g/dL (ref 32.0–36.0)
MCV: 87.2 fL (ref 80.0–100.0)
MPV: 10.8 fL (ref 7.5–12.5)
Platelets: 205 10*3/uL (ref 140–400)
RBC: 4.84 10*6/uL (ref 3.80–5.10)
RDW: 12.6 % (ref 11.0–15.0)
WBC: 4.1 10*3/uL (ref 3.8–10.8)

## 2017-06-21 LAB — COMPLETE METABOLIC PANEL WITH GFR
AG Ratio: 1.5 (calc) (ref 1.0–2.5)
ALBUMIN MSPROF: 4.7 g/dL (ref 3.6–5.1)
ALT: 21 U/L (ref 6–29)
AST: 20 U/L (ref 10–35)
Alkaline phosphatase (APISO): 63 U/L (ref 33–130)
BUN: 12 mg/dL (ref 7–25)
CALCIUM: 10.5 mg/dL — AB (ref 8.6–10.4)
CO2: 28 mmol/L (ref 20–32)
CREATININE: 0.68 mg/dL (ref 0.50–1.05)
Chloride: 105 mmol/L (ref 98–110)
GFR, EST NON AFRICAN AMERICAN: 97 mL/min/{1.73_m2} (ref >=60)
GFR, Est African American: 113 mL/min/{1.73_m2} (ref >=60)
GLOBULIN: 3.1 g/dL (ref 1.9–3.7)
GLUCOSE: 89 mg/dL (ref 65–99)
Potassium: 3.9 mmol/L (ref 3.5–5.3)
Sodium: 139 mmol/L (ref 135–146)
Total Bilirubin: 1 mg/dL (ref 0.2–1.2)
Total Protein: 7.8 g/dL (ref 6.1–8.1)

## 2017-06-21 LAB — LIPID PANEL
CHOL/HDL RATIO: 3.5 (calc) (ref <5.0)
CHOLESTEROL: 212 mg/dL — AB (ref <200)
HDL: 60 mg/dL (ref >50)
LDL Cholesterol (Calc): 128 mg/dL (calc) — ABNORMAL HIGH
Non-HDL Cholesterol (Calc): 152 mg/dL (calc) — ABNORMAL HIGH (ref <130)
Triglycerides: 128 mg/dL (ref <150)

## 2017-06-21 LAB — TSH: TSH: 0.52 mIU/L (ref 0.40–4.50)

## 2017-06-21 MED ORDER — BETAMETHASONE DIPROPIONATE 0.05 % EX CREA: TOPICAL_CREAM | CUTANEOUS | 1 refills | Status: DC

## 2017-06-21 MED ORDER — CLOTRIMAZOLE-BETAMETHASONE 1-0.05 % EX CREA: TOPICAL_CREAM | CUTANEOUS | 1 refills | Status: DC

## 2017-06-21 MED ORDER — CYCLOBENZAPRINE HCL 10 MG PO TABS: ORAL_TABLET | ORAL | 4 refills | Status: DC

## 2017-06-21 MED ORDER — NYSTATIN 100000 UNIT/GM EX POWD: CUTANEOUS | 1 refills | Status: DC

## 2017-06-21 MED ORDER — FLUTICASONE PROPIONATE 50 MCG/ACT NA SUSP: 1.0000 | Freq: Every day | NASAL | 6 refills | Status: DC

## 2017-06-21 NOTE — Patient Instructions (Addendum)
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 We consider it a privelige to serve you.  Delivering excellent health care in a caring and  compassionate way is our goal.  Partnering with you,  so that together we can achieve this goal is our strategy.

## 2017-06-21 NOTE — Telephone Encounter (Signed)
[facility] - Writer left a voice mail message.

## 2017-06-22 LAB — VITAMIN D 25 HYDROXY (VIT D DEFICIENCY, FRACTURES): Vit D, 25-Hydroxy: 11 ng/mL — ABNORMAL LOW (ref 30–100)

## 2017-06-24 ENCOUNTER — Encounter: Payer: Self-pay | Admitting: Family Medicine

## 2017-06-24 DIAGNOSIS — F32A Depression, unspecified: Secondary | ICD-10-CM | POA: Insufficient documentation

## 2017-06-24 DIAGNOSIS — F419 Anxiety disorder, unspecified: Secondary | ICD-10-CM | POA: Insufficient documentation

## 2017-06-24 DIAGNOSIS — B369 Superficial mycosis, unspecified: Secondary | ICD-10-CM | POA: Insufficient documentation

## 2017-06-24 DIAGNOSIS — F329 Major depressive disorder, single episode, unspecified: Secondary | ICD-10-CM | POA: Insufficient documentation

## 2017-06-24 NOTE — Assessment & Plan Note (Signed)
Rash under breasts with no bacterial superinfection, topical antifungal and yeast powder prescribed, and pt advised to keep skin dry

## 2017-06-24 NOTE — Assessment & Plan Note (Signed)
Annual exam as documented. Counseling done  re healthy lifestyle involving commitment to 150 minutes exercise per week, heart healthy diet, and attaining healthy weight.The importance of adequate sleep also discussed.  Immunization and cancer screening needs are specifically addressed at this visit.  

## 2017-06-24 NOTE — Progress Notes (Signed)
    Laurie Olson     MRN: [id number]      DOB: 1960/03/19  HPI: Patient is in for annual physical exam. C/o anxiety and mild depression, not suicidal or homicidal, worries over bills and finances as a single parent, has benefited from medication in the past and is interested in teletherapy Recent labs, if available are reviewed. Wants colonoscopy from Provider in [place] Immunization is reviewed , and  updated if needed.   PE: BP 122/88   Pulse 81   Resp 16   Ht  (1.778 m)   Wt 210 lb (95.3 kg)   LMP 07/09/2010   SpO2 99%   BMI 30.13 kg/m   Pleasant  female, alert and oriented x 3, in no cardio-pulmonary distress. Afebrile. HEENT No facial trauma or asymetry. Sinuses non tender.  Extra occullar muscles intact, pupils equally reactive to light. External ears normal, tympanic membranes clear. Oropharynx moist, no exudate. Neck: supple, no adenopathy,JVD or thyromegaly.No bruits.  Chest: Clear to ascultation bilaterally.No crackles or wheezes. Non tender to palpation  Breast: No asymetry,no masses or lumps. No tenderness. No nipple discharge or inversion. No axillary or supraclavicular adenopathy  Cardiovascular system; Heart sounds normal,  S1 and  S2 ,no S3.  No murmur, or thrill. Apical beat not displaced Peripheral pulses normal.  Abdomen: Soft, non tender, no organomegaly or masses. No bruits. Bowel sounds normal. No guarding, tenderness or rebound.  Rectal:  Deferred , referred for colonoscopy  GU:  asymptomatic, not examined   Musculoskeletal exam: Decreased though adequate  ROM of lumbar  Spine, normal in  hips , shoulders and knees. No deformity ,swelling or crepitus noted. No muscle wasting or atrophy.   Neurologic: Cranial nerves 2 to 12 intact. Power, tone ,sensation and reflexes normal throughout. No disturbance in gait. No tremor.  Skin: Fungal and yeast rash under breasts. Pigmentation hyperemic under breasts  Psych; Mildly  anxious and depressed  mood. Judgement and concentration normal   Assessment & Plan:  Annual physical exam Annual exam as documented. Counseling done  re healthy lifestyle involving commitment to 150 minutes exercise per week, heart healthy diet, and attaining healthy weight.The importance of adequate sleep also discussed. . Immunization and cancer screening needs are specifically addressed at this visit.   Anxiety and depression Start fluoxetine 10 mg daily and refer to telepsych, has benefited in the past from medication, primarily related to financial stress  Dermatomycosis Rash under breasts with no bacterial superinfection, topical antifungal and yeast powder prescribed, and pt advised to keep skin dry  Obesity (BMI 30.0-34.9) Improved. Patient re-educated about  the importance of commitment to a  minimum of 150 minutes of exercise per week.  The importance of healthy food choices with portion control discussed.  Goals set by the patient for the next several months.   Weight /BMI 06/21/2017 12/26/2016 05/15/2016  WEIGHT 210 lb 215 lb 8 oz 219 lb  HEIGHT     BMI 30.13 kg/m2 34.78 kg/m2 35.35 kg/m2

## 2017-06-24 NOTE — Assessment & Plan Note (Signed)
Improved. Patient re-educated about  the importance of commitment to a  minimum of 150 minutes of exercise per week.  The importance of healthy food choices with portion control discussed.  Goals set by the patient for the next several months.   Weight /BMI 06/21/2017 12/26/2016 05/15/2016  WEIGHT 210 lb 215 lb 8 oz 219 lb  HEIGHT     BMI 30.13 kg/m2 34.78 kg/m2 35.35 kg/m2

## 2017-06-24 NOTE — Assessment & Plan Note (Signed)
Start fluoxetine 10 mg daily and refer to telepsych, has benefited in the past from medication, primarily related to financial stress

## 2017-07-03 ENCOUNTER — Encounter: Payer: BLUE CROSS/BLUE SHIELD | Admitting: Family Medicine

## 2017-07-04 ENCOUNTER — Encounter (INDEPENDENT_AMBULATORY_CARE_PROVIDER_SITE_OTHER): Payer: Self-pay | Admitting: *Deleted

## 2017-07-16 ENCOUNTER — Other Ambulatory Visit: Payer: Self-pay | Admitting: Family Medicine

## 2017-07-18 ENCOUNTER — Telehealth: Payer: Self-pay | Admitting: Clinical

## 2017-07-18 NOTE — Telephone Encounter (Signed)
This [facility] specialist left message to call back with name and contact information. This is the second attempt to contact patient. [facility] team will try again to complete initial assessment and medication monitoring.

## 2017-08-07 ENCOUNTER — Telehealth: Payer: Self-pay | Admitting: Family Medicine

## 2017-08-07 NOTE — Telephone Encounter (Signed)
Patient is requesting to do cologuard. Cb#: [phone]

## 2017-08-07 NOTE — Telephone Encounter (Signed)
Ok to order for this patient or wait until appt?

## 2017-08-08 NOTE — Telephone Encounter (Signed)
We can wait , I dop not know what is the urgency, pls let her know this will be addressed at the visit

## 2017-08-09 ENCOUNTER — Telehealth: Payer: Self-pay | Admitting: Family Medicine

## 2017-08-09 NOTE — Telephone Encounter (Signed)
LEFT VOICEMAIL TO CALL BACK & DISCUSS.  °

## 2017-08-09 NOTE — Telephone Encounter (Signed)
Patient lvm that she was returning a call, I called her back at 3:21pm, no answer

## 2017-08-21 ENCOUNTER — Ambulatory Visit: Payer: BLUE CROSS/BLUE SHIELD | Admitting: Family Medicine

## 2017-08-22 ENCOUNTER — Telehealth: Payer: Self-pay | Admitting: Family Medicine

## 2017-08-22 ENCOUNTER — Encounter: Payer: Self-pay | Admitting: Family Medicine

## 2017-08-22 NOTE — Telephone Encounter (Signed)
Called patient to r/s no show appt she missed on 08/20/17. No answer/ left message/mailed letter.

## 2017-09-06 ENCOUNTER — Telehealth: Payer: Self-pay

## 2017-09-06 NOTE — Telephone Encounter (Signed)
Writer has made several attempts to contact the patient without success.  Patient will be placed on the inactive list. If the patient requires services again please submit another referral in the [facility] pool.   Writer will route this information to the PCP and Dr. Hisada.  

## 2017-10-04 ENCOUNTER — Telehealth: Payer: Self-pay

## 2017-10-04 NOTE — Telephone Encounter (Signed)
[facility] - Left Msg 

## 2017-10-15 ENCOUNTER — Ambulatory Visit (HOSPITAL_COMMUNITY): Payer: BLUE CROSS/BLUE SHIELD

## 2017-10-16 ENCOUNTER — Telehealth: Payer: Self-pay

## 2017-10-16 NOTE — Telephone Encounter (Signed)
Several attempts have been made to contact patient without success. Patient will be placed on the inactive list.  If services are needed again.  Please contact [facility] at [phone].    Information will be routed to the PCP and Dr. Hisada  

## 2017-10-29 ENCOUNTER — Other Ambulatory Visit: Payer: Self-pay | Admitting: Family Medicine

## 2017-11-18 ENCOUNTER — Other Ambulatory Visit: Payer: Self-pay | Admitting: Family Medicine

## 2018-02-21 ENCOUNTER — Other Ambulatory Visit: Payer: Self-pay | Admitting: Family Medicine

## 2018-04-15 ENCOUNTER — Telehealth: Payer: Self-pay

## 2018-04-15 DIAGNOSIS — I10 Essential (primary) hypertension: Secondary | ICD-10-CM

## 2018-04-15 DIAGNOSIS — E669 Obesity, unspecified: Secondary | ICD-10-CM

## 2018-04-15 DIAGNOSIS — E785 Hyperlipidemia, unspecified: Secondary | ICD-10-CM

## 2018-04-15 DIAGNOSIS — E559 Vitamin D deficiency, unspecified: Secondary | ICD-10-CM

## 2018-04-15 NOTE — Telephone Encounter (Signed)
CBC, fasting lipid , cmp and eGFr and vit D please, thanks

## 2018-04-15 NOTE — Telephone Encounter (Signed)
Labs ordered, copy sent to patient

## 2018-04-15 NOTE — Telephone Encounter (Signed)
Patient called requesting labs be ordered for her upcoming CPE on 04-18-18. Which ones do you want ordered?

## 2018-04-18 ENCOUNTER — Encounter: Payer: Self-pay | Admitting: Family Medicine

## 2018-04-18 ENCOUNTER — Ambulatory Visit (INDEPENDENT_AMBULATORY_CARE_PROVIDER_SITE_OTHER): Payer: BLUE CROSS/BLUE SHIELD | Admitting: Family Medicine

## 2018-04-18 VITALS — BP 118/80 | HR 89 | Resp 15 | Ht 66.0 in | Wt 207.0 lb

## 2018-04-18 DIAGNOSIS — Z0000 Encounter for general adult medical examination without abnormal findings: Secondary | ICD-10-CM

## 2018-04-18 DIAGNOSIS — Z23 Encounter for immunization: Secondary | ICD-10-CM | POA: Diagnosis not present

## 2018-04-18 DIAGNOSIS — E669 Obesity, unspecified: Secondary | ICD-10-CM | POA: Diagnosis not present

## 2018-04-18 DIAGNOSIS — Z1231 Encounter for screening mammogram for malignant neoplasm of breast: Secondary | ICD-10-CM

## 2018-04-18 MED ORDER — METOPROLOL SUCCINATE ER 25 MG PO TB24: 25.0000 mg | ORAL_TABLET | Freq: Every day | ORAL | 3 refills | Status: DC

## 2018-04-18 MED ORDER — CYCLOBENZAPRINE HCL 10 MG PO TABS: 10.0000 mg | ORAL_TABLET | Freq: Three times a day (TID) | ORAL | 3 refills | Status: DC | PRN

## 2018-04-18 MED ORDER — FLUTICASONE PROPIONATE 50 MCG/ACT NA SUSP: 1.0000 | Freq: Every day | NASAL | 11 refills | Status: DC

## 2018-04-18 MED ORDER — AMLODIPINE BESYLATE 10 MG PO TABS: 10.0000 mg | ORAL_TABLET | Freq: Every day | ORAL | 3 refills | Status: DC

## 2018-04-18 NOTE — Patient Instructions (Addendum)
F/U with MD in mid September, call if you need me before  Shingrix #1 today  Nurse visit in 2 to 4 months for shingrix #2  Please schedule mammogram at checkout    Form for work to be completed after labs are available, early next week  Weight loss goal of 2 to 2.5 pounds per month  Think about what you will eat, plan ahead. Choose " clean, green, fresh or frozen" over canned, processed or packaged foods which are more sugary, salty and fatty. 70 to 75% of food eaten should be vegetables and fruit. Three meals at set times with snacks allowed between meals, but they must be fruit or vegetables. Aim to eat over a 12 hour period , example 7 am to 7 pm, and STOP after  your last meal of the day. Drink water,generally about 64 ounces per day, no other drink is as healthy. Fruit juice is best enjoyed in a healthy way, by EATING the fruit.  You need eye exam due to poor vision  It is important that you exercise regularly at least 30 minutes 5 times a week. If you develop chest pain, have severe difficulty breathing, or feel very tired, stop exercising immediately and seek medical attention

## 2018-04-20 ENCOUNTER — Encounter: Payer: Self-pay | Admitting: Family Medicine

## 2018-04-20 DIAGNOSIS — Z23 Encounter for immunization: Secondary | ICD-10-CM | POA: Insufficient documentation

## 2018-04-20 NOTE — Assessment & Plan Note (Signed)
Shingrix #1 administered with no adverse s/e at the time of administration

## 2018-04-20 NOTE — Progress Notes (Signed)
    Laurie Olson     MRN: [id number]      DOB: 1960-12-15  HPI: Patient is in for annual physical exam. No other health concerns are expressed or addressed at the visit. Recent labs, if available are reviewed. Immunization is reviewed , and  updated if needed.   PE: BP 118/80   Pulse 89   Resp 15   Ht 5\' 6"  (1.676 m)   Wt 207 lb (93.9 kg)   LMP 07/09/2010   SpO2 99%   BMI 33.41 kg/m   Pleasant  female, alert and oriented x 3, in no cardio-pulmonary distress. Afebrile. HEENT No facial trauma or asymetry. Sinuses non tender.  Extra occullar muscles intact, pupils equally reactive to light. External ears normal, tympanic membranes clear. Oropharynx moist, no exudate. Neck: supple, no adenopathy,JVD or thyromegaly.No bruits.  Chest: Clear to ascultation bilaterally.No crackles or wheezes. Non tender to palpation  Breast: No asymetry,no masses or lumps. No tenderness. No nipple discharge or inversion. No axillary or supraclavicular adenopathy  Cardiovascular system; Heart sounds normal,  S1 and  S2 ,no S3.  No murmur, or thrill. Apical beat not displaced Peripheral pulses normal.  Abdomen: Soft, non tender, no organomegaly or masses. No bruits. Bowel sounds normal. No guarding, tenderness or rebound.    Musculoskeletal exam: Full ROM of spine, hips , shoulders and knees. No deformity ,swelling or crepitus noted. No muscle wasting or atrophy.   Neurologic: Cranial nerves 2 to 12 intact. Power, tone ,sensation and reflexes normal throughout. No disturbance in gait. No tremor.  Skin: Intact, no ulceration, erythema , scaling or rash noted. Pigmentation normal throughout  Psych; Normal mood and affect. Judgement and concentration normal   Assessment & Plan:  Annual physical exam Annual exam as documented. Counseling done  re healthy lifestyle involving commitment to 150 minutes exercise per week, heart healthy diet, and attaining healthy weight.The  importance of adequate sleep also discussed. Regular seat belt use and home safety, is also discussed. Changes in health habits are decided on by the patient with goals and time frames  set for achieving them. Immunization and cancer screening needs are specifically addressed at this visit.   Obesity (BMI 30.0-34.9) Improved, she is applauded on this and encouraged to cotinue same  Patient re-educated about  the importance of commitment to a  minimum of 150 minutes of exercise per week as able.  The importance of healthy food choices with portion control discussed, as well as eating regularly and within a 12 hour window most days. The need to choose "clean , green" food 50 to 75% of the time is discussed, as well as to make water the primary drink and set a goal of 64 ounces water daily.  Encouraged to start a food diary,  and to consider  joining a support group. Sample diet sheets offered. Goals set by the patient for the next several months.   Weight /BMI 04/18/2018 06/21/2017 12/26/2016  WEIGHT 207 lb 210 lb 215 lb 8 oz  HEIGHT 5\' 6"  5\' 10"  5\' 6"   BMI 33.41 kg/m2 30.13 kg/m2 34.78 kg/m2      Need for shingles vaccine Shingrix #1 administered with no adverse s/e at the time of administration

## 2018-04-20 NOTE — Assessment & Plan Note (Signed)

## 2018-04-20 NOTE — Assessment & Plan Note (Signed)
Improved, she is applauded on this and encouraged to cotinue same  Patient re-educated about  the importance of commitment to a  minimum of 150 minutes of exercise per week as able.  The importance of healthy food choices with portion control discussed, as well as eating regularly and within a 12 hour window most days. The need to choose "clean , green" food 50 to 75% of the time is discussed, as well as to make water the primary drink and set a goal of 64 ounces water daily.  Encouraged to start a food diary,  and to consider  joining a support group. Sample diet sheets offered. Goals set by the patient for the next several months.   Weight /BMI 04/18/2018 06/21/2017 12/26/2016  WEIGHT 207 lb 210 lb 215 lb 8 oz  HEIGHT 5\' 6"  5\' 10"  5\' 6"   BMI 33.41 kg/m2 30.13 kg/m2 34.78 kg/m2

## 2018-04-29 ENCOUNTER — Ambulatory Visit (HOSPITAL_COMMUNITY): Payer: BLUE CROSS/BLUE SHIELD

## 2018-05-06 ENCOUNTER — Ambulatory Visit (HOSPITAL_COMMUNITY)
Admission: RE | Admit: 2018-05-06 | Discharge: 2018-05-06 | Disposition: A | Discharge status: Home / Self Care | Payer: BLUE CROSS/BLUE SHIELD | Source: Ambulatory Visit | Attending: Family Medicine | Admitting: Family Medicine

## 2018-05-06 DIAGNOSIS — Z1231 Encounter for screening mammogram for malignant neoplasm of breast: Secondary | ICD-10-CM | POA: Insufficient documentation

## 2018-05-07 ENCOUNTER — Telehealth: Payer: Self-pay | Admitting: *Deleted

## 2018-05-07 NOTE — Telephone Encounter (Signed)
Called pt about form needing blood work before she picked it up no LVM

## 2018-06-18 ENCOUNTER — Other Ambulatory Visit: Payer: Self-pay

## 2018-06-18 ENCOUNTER — Ambulatory Visit (INDEPENDENT_AMBULATORY_CARE_PROVIDER_SITE_OTHER): Payer: BLUE CROSS/BLUE SHIELD | Admitting: Family Medicine

## 2018-06-18 ENCOUNTER — Encounter: Payer: Self-pay | Admitting: Family Medicine

## 2018-06-18 VITALS — BP 120/86 | Ht 66.0 in | Wt 207.0 lb

## 2018-06-18 DIAGNOSIS — I10 Essential (primary) hypertension: Secondary | ICD-10-CM

## 2018-06-18 DIAGNOSIS — E669 Obesity, unspecified: Secondary | ICD-10-CM | POA: Diagnosis not present

## 2018-06-18 DIAGNOSIS — K219 Gastro-esophageal reflux disease without esophagitis: Secondary | ICD-10-CM

## 2018-06-18 DIAGNOSIS — Z23 Encounter for immunization: Secondary | ICD-10-CM

## 2018-06-18 MED ORDER — PANTOPRAZOLE SODIUM 40 MG PO TBEC: 40.0000 mg | DELAYED_RELEASE_TABLET | Freq: Every day | ORAL | 1 refills | Status: DC

## 2018-06-18 NOTE — Patient Instructions (Addendum)
Keep appointment as before , call if you need me sooner  New medication sent for reflux protonix 40 mg one daily  Call in 5 to 6 weeks if you are still having GERD symptoms, I will refer you  Appointment next Wednesday morning for shingles vaccine # 2 ( Nurse visit only) front staff will call today to verify wit you  Thanks for choosing [facility], we consider it a privelige to serve you.

## 2018-06-18 NOTE — Progress Notes (Signed)
Virtual Visit via Telephone Note  I connected with Laurie Olson on 06/18/18 at 11:00 AM EDT by telephone and verified that I am speaking with the correct person using two identifiers.   I discussed the limitations, risks, security and privacy concerns of performing an evaluation and management service by telephone and the availability of in person appointments. I also discussed with the patient that there may be a patient responsible charge related to this service. The patient expressed understanding and agreed to proceed. PATIENT IS AT HOME AND I AM AT HOME, webex attemp unsuccessful VERY STRESSED AS OUT OF WORK SINCE April 1, and still on hold, hoping to return to work May 1, new [place] ma, baby born  05/30/2018, and  This is a positive in her life , which she is celebrating, this is her first grand child   History of Present Illness: 2 month h/o heartburn when she eats and also when lying down. She denies dysphagia, she does not smoke an  Reports 2 months of increased stress. Denies change in BM or black  Stool or visible blood in the stool  Review of Systems  Constitutional: Negative for chills, fever and weight loss.  HENT: Negative for congestion and sinus pain.   Respiratory: Negative for cough.   Cardiovascular: Negative for chest pain and palpitations.  Gastrointestinal: Positive for abdominal pain and heartburn. Negative for blood in stool, constipation, diarrhea, melena, nausea and vomiting.  Psychiatric/Behavioral: The patient is nervous/anxious.        Observations/Objective:  BP 120/86   Ht 5\' 6"  (1.676 m)   Wt 207 lb (93.9 kg)   LMP 07/09/2010   BMI 33.41 kg/m   Assessment and Plan: GERD (gastroesophageal reflux disease) 2 month h/o disabling symptoms. Pt education re discontinuing caffeine and dietary habits  Start high dose PPI for 2 months, call back if symptoms persist, will nee GI eval if this is the case  Need for shingles vaccine Needs appt for next week  to complete series  Obesity (BMI 30.0-34.9)   Patient re-educated about  the importance of commitment to a  minimum of 150 minutes of exercise per week as able.  The importance of healthy food choices with portion control discussed, as well as eating regularly and within a 12 hour window most days. The need to choose "clean , green" food 50 to 75% of the time is discussed, as well as to make water the primary drink and set a goal of 64 ounces water daily.     Weight /BMI 06/18/2018 04/18/2018 06/21/2017  WEIGHT 207 lb 207 lb 210 lb  HEIGHT 5\' 6"  5\' 6"  5\' 10"   BMI 33.41 kg/m2 33.41 kg/m2 30.13 kg/m2      Essential hypertension Controlled, no change in medication DASH diet and commitment to daily physical activity for a minimum of 30 minutes discussed and encouraged, as a part of hypertension management. The importance of attaining a healthy weight is also discussed.  BP/Weight 06/18/2018 04/18/2018 06/21/2017 12/26/2016 05/15/2016 03/04/2015 07/22/2014  Systolic BP 120 118 122 132 130 128 130  Diastolic BP 86 80 88 80 84 64 90  Wt. (Lbs) 207 207 210 215.5 219 215 214.12  BMI 33.41 33.41 30.13 34.78 35.35 34.72 34.58          Follow Up Instructions:    I discussed the assessment and treatment plan with the patient. The patient was provided an opportunity to ask questions and all were answered. The patient agreed with the plan  and demonstrated an understanding of the instructions.   The patient was advised to call back or seek an in-person evaluation if the symptoms worsen or if the condition fails to improve as anticipated.  I provided 20 minutes of non-face-to-face time during this encounter.   Tula Nakayama, MD

## 2018-06-19 ENCOUNTER — Ambulatory Visit (INDEPENDENT_AMBULATORY_CARE_PROVIDER_SITE_OTHER): Payer: BLUE CROSS/BLUE SHIELD

## 2018-06-19 ENCOUNTER — Other Ambulatory Visit: Payer: Self-pay

## 2018-06-19 DIAGNOSIS — Z23 Encounter for immunization: Secondary | ICD-10-CM

## 2018-06-23 ENCOUNTER — Encounter: Payer: Self-pay | Admitting: Family Medicine

## 2018-06-23 NOTE — Assessment & Plan Note (Signed)
2 month h/o disabling symptoms. Pt education re discontinuing caffeine and dietary habits  Start high dose PPI for 2 months, call back if symptoms persist, will nee GI eval if this is the case

## 2018-06-23 NOTE — Assessment & Plan Note (Signed)
   Patient re-educated about  the importance of commitment to a  minimum of 150 minutes of exercise per week as able.  The importance of healthy food choices with portion control discussed, as well as eating regularly and within a 12 hour window most days. The need to choose "clean , green" food 50 to 75% of the time is discussed, as well as to make water the primary drink and set a goal of 64 ounces water daily.     Weight /BMI 06/18/2018 04/18/2018 06/21/2017  WEIGHT 207 lb 207 lb 210 lb  HEIGHT 5\' 6"  5\' 6"  5\' 10"   BMI 33.41 kg/m2 33.41 kg/m2 30.13 kg/m2

## 2018-06-23 NOTE — Assessment & Plan Note (Signed)
Controlled, no change in medication DASH diet and commitment to daily physical activity for a minimum of 30 minutes discussed and encouraged, as a part of hypertension management. The importance of attaining a healthy weight is also discussed.  BP/Weight 06/18/2018 04/18/2018 06/21/2017 12/26/2016 05/15/2016 03/04/2015 07/22/2014  Systolic BP 120 118 122 132 130 128 130  Diastolic BP 86 80 88 80 84 64 90  Wt. (Lbs) 207 207 210 215.5 219 215 214.12  BMI 33.41 33.41 30.13 34.78 35.35 34.72 34.58

## 2018-06-23 NOTE — Assessment & Plan Note (Signed)
Needs appt for next week to complete series

## 2018-07-15 ENCOUNTER — Other Ambulatory Visit: Payer: Self-pay | Admitting: Family Medicine

## 2018-08-24 ENCOUNTER — Other Ambulatory Visit: Payer: Self-pay | Admitting: Family Medicine

## 2018-10-15 ENCOUNTER — Other Ambulatory Visit: Payer: Self-pay

## 2018-10-15 MED ORDER — PANTOPRAZOLE SODIUM 40 MG PO TBEC: 40.0000 mg | DELAYED_RELEASE_TABLET | Freq: Every day | ORAL | 0 refills | Status: DC

## 2018-10-31 ENCOUNTER — Other Ambulatory Visit: Payer: Self-pay | Admitting: Family Medicine

## 2018-11-12 ENCOUNTER — Ambulatory Visit: Payer: BLUE CROSS/BLUE SHIELD | Admitting: Family Medicine

## 2018-12-11 ENCOUNTER — Ambulatory Visit (INDEPENDENT_AMBULATORY_CARE_PROVIDER_SITE_OTHER): Payer: BC Managed Care – PPO | Admitting: Family Medicine

## 2018-12-11 ENCOUNTER — Other Ambulatory Visit (HOSPITAL_COMMUNITY): Payer: Self-pay | Admitting: Family Medicine

## 2018-12-11 ENCOUNTER — Other Ambulatory Visit: Payer: Self-pay

## 2018-12-11 ENCOUNTER — Encounter: Payer: Self-pay | Admitting: Family Medicine

## 2018-12-11 VITALS — BP 120/86 | Ht 66.0 in | Wt 207.0 lb

## 2018-12-11 DIAGNOSIS — Z23 Encounter for immunization: Secondary | ICD-10-CM

## 2018-12-11 DIAGNOSIS — E669 Obesity, unspecified: Secondary | ICD-10-CM

## 2018-12-11 DIAGNOSIS — I10 Essential (primary) hypertension: Secondary | ICD-10-CM | POA: Diagnosis not present

## 2018-12-11 DIAGNOSIS — E785 Hyperlipidemia, unspecified: Secondary | ICD-10-CM

## 2018-12-11 DIAGNOSIS — Z1231 Encounter for screening mammogram for malignant neoplasm of breast: Secondary | ICD-10-CM

## 2018-12-11 DIAGNOSIS — E8881 Metabolic syndrome: Secondary | ICD-10-CM

## 2018-12-11 DIAGNOSIS — J302 Other seasonal allergic rhinitis: Secondary | ICD-10-CM | POA: Diagnosis not present

## 2018-12-11 DIAGNOSIS — K219 Gastro-esophageal reflux disease without esophagitis: Secondary | ICD-10-CM | POA: Diagnosis not present

## 2018-12-11 DIAGNOSIS — E559 Vitamin D deficiency, unspecified: Secondary | ICD-10-CM

## 2018-12-11 MED ORDER — PREDNISONE 10 MG PO TABS: 10.0000 mg | ORAL_TABLET | Freq: Two times a day (BID) | ORAL | 0 refills | Status: DC

## 2018-12-11 MED ORDER — FLUTICASONE PROPIONATE 50 MCG/ACT NA SUSP: 1.0000 | Freq: Every day | NASAL | 11 refills | Status: DC

## 2018-12-11 MED ORDER — MONTELUKAST SODIUM 10 MG PO TABS: 10.0000 mg | ORAL_TABLET | Freq: Every day | ORAL | 3 refills | Status: DC

## 2018-12-11 MED ORDER — BETAMETHASONE DIPROPIONATE 0.05 % EX CREA: TOPICAL_CREAM | CUTANEOUS | 0 refills | Status: DC

## 2018-12-11 NOTE — Patient Instructions (Signed)
Annual physical exam in march with MD when due, call o if you need me sooner  Please schedule mammogram due in March 2021  Flu vaccine in office today  Fasting labs today  Two  allergy medications are prescribed, flonase and montelukast, and prednisone , short term  Cream prescribed for rash on hands, which may be due to an allergy from your gloves, need to call if persists for allergy testing to determine the cause   It is important that you exercise regularly at least 30 minutes 5 times a week. If you develop chest pain, have severe difficulty breathing, or feel very tired, stop exercising immediately and seek medical attention    Thanks for choosing [facility], we consider it a privelige to serve you.

## 2018-12-11 NOTE — Progress Notes (Signed)
Virtual Visit via Telephone Note  I connected with Laurie Olson on 12/11/18 at  9:20 AM EDT by telephone and verified that I am speaking with the correct person using two identifiers.  Location: Patient: HOME Provider: OFFICE   I discussed the limitations, risks, security and privacy concerns of performing an evaluation and management service by telephone and the availability of in person appointments. I also discussed with the patient that there may be a patient responsible charge related to this service. The patient expressed understanding and agreed to proceed.   History of Present Illness: F/u chronic problems. C/o increased sinus drainage, clear with scratchy throat,an d occasional cough. C/o increased itching oin hands, recently cjanged her job responsibility, is wearing gloves which are new, no known allergy to latex. Denies wheeze or difficulty swallowing. Denies recent fever or chills.   Denies chest pains, palpitations and leg swelling Denies abdominal pain, nausea, vomiting,diarrhea or constipation.   Denies dysuria, frequency, hesitancy or incontinence. Denies joint pain, swelling and limitation in mobility. Denies headaches, seizures, numbness, or tingling. Denies depression, anxiety or insomnia. Denies skin break down or rash.       Observations/Objective: BP 120/86   Ht 5\' 6"  (1.676 m)   Wt 207 lb (93.9 kg)   LMP 07/09/2010   BMI 33.41 kg/m  Good communication with no confusion and intact memory. Alert and oriented x 3 No signs of respiratory distress during speech    Assessment and Plan:    Follow Up Instructions:    I discussed the assessment and treatment plan with the patient. The patient was provided an opportunity to ask questions and all were answered. The patient agreed with the plan and demonstrated an understanding of the instructions.   The patient was advised to call back or seek an in-person evaluation if the symptoms worsen or if the  condition fails to improve as anticipated.  I provided 22  minutes of non-face-to-face time during this encounter.   Tula Nakayama, MD Essential hypertension Controlled, no change in medication DASH diet and commitment to daily physical activity for a minimum of 30 minutes discussed and encouraged, as a part of hypertension management. The importance of attaining a healthy weight is also discussed.  BP/Weight 12/11/2018 06/18/2018 04/18/2018 06/21/2017 12/26/2016 1/61/0960 06/01/4096  Systolic BP 119 147 829 562 130 865 784  Diastolic BP 86 86 80 88 80 84 64  Wt. (Lbs) 207 207 207 210 215.5 219 215  BMI 33.41 33.41 33.41 30.13 34.78 35.35 34.72       Seasonal allergies Increased and uncontrolled symptoms with change is season. Short course of prednisone prescribed Continue chronic medications as before  GERD (gastroesophageal reflux disease) Controlled, no change in medication   Obesity (BMI 30.0-34.9)  Patient re-educated about  the importance of commitment to a  minimum of 150 minutes of exercise per week as able.  The importance of healthy food choices with portion control discussed, as well as eating regularly and within a 12 hour window most days. The need to choose "clean , green" food 50 to 75% of the time is discussed, as well as to make water the primary drink and set a goal of 64 ounces water daily.    Weight /BMI 12/11/2018 06/18/2018 04/18/2018  WEIGHT 207 lb 207 lb 207 lb  HEIGHT 5\' 6"  5\' 6"  5\' 6"   BMI 33.41 kg/m2 33.41 kg/m2 33.41 kg/m2      Hyperlipidemia Hyperlipidemia:Low fat diet discussed and encouraged.   Lipid Panel  Lab Results  Component Value Date   CHOL 262 (H) 12/11/2018   HDL 52 12/11/2018   LDLCALC 178 (H) 12/11/2018   TRIG 174 (H) 12/11/2018   CHOLHDL 5.0 (H) 12/11/2018  uncontrolled, needs to lower fatty food intake, also recommend starting a statin due to increased CV risk     Vitamin D deficiency Very low level of vit D,  needs weekly vit D

## 2018-12-12 LAB — COMPLETE METABOLIC PANEL WITH GFR
AG Ratio: 1.4 (calc) (ref 1.0–2.5)
ALT: 28 U/L (ref 6–29)
AST: 24 U/L (ref 10–35)
Albumin: 4.2 g/dL (ref 3.6–5.1)
Alkaline phosphatase (APISO): 59 U/L (ref 37–153)
BUN: 12 mg/dL (ref 7–25)
CO2: 28 mmol/L (ref 20–32)
Calcium: 9.8 mg/dL (ref 8.6–10.4)
Chloride: 105 mmol/L (ref 98–110)
Creat: 0.7 mg/dL (ref 0.50–1.05)
GFR, Est African American: 110 mL/min/{1.73_m2} (ref >=60)
GFR, Est Non African American: 95 mL/min/{1.73_m2} (ref >=60)
Globulin: 3 g/dL (calc) (ref 1.9–3.7)
Glucose, Bld: 91 mg/dL (ref 65–99)
Potassium: 3.8 mmol/L (ref 3.5–5.3)
Sodium: 140 mmol/L (ref 135–146)
Total Bilirubin: 0.8 mg/dL (ref 0.2–1.2)
Total Protein: 7.2 g/dL (ref 6.1–8.1)

## 2018-12-12 LAB — CBC
HCT: 42 % (ref 35.0–45.0)
Hemoglobin: 13.9 g/dL (ref 11.7–15.5)
MCH: 29.9 pg (ref 27.0–33.0)
MCHC: 33.1 g/dL (ref 32.0–36.0)
MCV: 90.3 fL (ref 80.0–100.0)
MPV: 10.7 fL (ref 7.5–12.5)
Platelets: 245 10*3/uL (ref 140–400)
RBC: 4.65 10*6/uL (ref 3.80–5.10)
RDW: 13 % (ref 11.0–15.0)
WBC: 3.6 10*3/uL — ABNORMAL LOW (ref 3.8–10.8)

## 2018-12-12 LAB — TSH: TSH: 0.45 mIU/L (ref 0.40–4.50)

## 2018-12-12 LAB — LIPID PANEL
Cholesterol: 262 mg/dL — ABNORMAL HIGH (ref <200)
HDL: 52 mg/dL (ref >=50)
LDL Cholesterol (Calc): 178 mg/dL (calc) — ABNORMAL HIGH
Non-HDL Cholesterol (Calc): 210 mg/dL (calc) — ABNORMAL HIGH (ref <130)
Total CHOL/HDL Ratio: 5 (calc) — ABNORMAL HIGH (ref <5.0)
Triglycerides: 174 mg/dL — ABNORMAL HIGH (ref <150)

## 2018-12-12 LAB — VITAMIN D 25 HYDROXY (VIT D DEFICIENCY, FRACTURES): Vit D, 25-Hydroxy: 15 ng/mL — ABNORMAL LOW (ref 30–100)

## 2018-12-14 ENCOUNTER — Encounter: Payer: Self-pay | Admitting: Family Medicine

## 2018-12-14 MED ORDER — ERGOCALCIFEROL 1.25 MG (50000 UT) PO CAPS: 50000.0000 [IU] | ORAL_CAPSULE | ORAL | 1 refills | Status: DC

## 2018-12-14 MED ORDER — ROSUVASTATIN CALCIUM 20 MG PO TABS: 20.0000 mg | ORAL_TABLET | Freq: Every day | ORAL | 1 refills | Status: DC

## 2018-12-14 NOTE — Assessment & Plan Note (Signed)
Very low level of vit D, needs weekly vit D

## 2018-12-14 NOTE — Assessment & Plan Note (Signed)
Controlled, no change in medication  

## 2018-12-14 NOTE — Assessment & Plan Note (Signed)
Increased and uncontrolled symptoms with change is season. Short course of prednisone prescribed Continue chronic medications as before

## 2018-12-14 NOTE — Assessment & Plan Note (Signed)
  Patient re-educated about  the importance of commitment to a  minimum of 150 minutes of exercise per week as able.  The importance of healthy food choices with portion control discussed, as well as eating regularly and within a 12 hour window most days. The need to choose "clean , green" food 50 to 75% of the time is discussed, as well as to make water the primary drink and set a goal of 64 ounces water daily.    Weight /BMI 12/11/2018 06/18/2018 04/18/2018  WEIGHT 207 lb 207 lb 207 lb  HEIGHT 5\' 6"  5\' 6"  5\' 6"   BMI 33.41 kg/m2 33.41 kg/m2 33.41 kg/m2

## 2018-12-14 NOTE — Assessment & Plan Note (Signed)
Hyperlipidemia:Low fat diet discussed and encouraged.   Lipid Panel  Lab Results  Component Value Date   CHOL 262 (H) 12/11/2018   HDL 52 12/11/2018   LDLCALC 178 (H) 12/11/2018   TRIG 174 (H) 12/11/2018   CHOLHDL 5.0 (H) 12/11/2018  uncontrolled, needs to lower fatty food intake, also recommend starting a statin due to increased CV risk

## 2018-12-14 NOTE — Assessment & Plan Note (Signed)
Controlled, no change in medication DASH diet and commitment to daily physical activity for a minimum of 30 minutes discussed and encouraged, as a part of hypertension management. The importance of attaining a healthy weight is also discussed.  BP/Weight 12/11/2018 06/18/2018 04/18/2018 06/21/2017 12/26/2016 0/72/1828 09/30/3742  Systolic BP 514 604 799 872 158 727 618  Diastolic BP 86 86 80 88 80 84 64  Wt. (Lbs) 207 207 207 210 215.5 219 215  BMI 33.41 33.41 33.41 30.13 34.78 35.35 34.72

## 2018-12-17 DIAGNOSIS — Z23 Encounter for immunization: Secondary | ICD-10-CM | POA: Diagnosis not present

## 2018-12-30 ENCOUNTER — Other Ambulatory Visit: Payer: Self-pay | Admitting: Family Medicine

## 2019-02-25 ENCOUNTER — Ambulatory Visit: Payer: BC Managed Care – PPO | Admitting: Family Medicine

## 2019-04-10 ENCOUNTER — Ambulatory Visit (INDEPENDENT_AMBULATORY_CARE_PROVIDER_SITE_OTHER): Payer: BC Managed Care – PPO | Admitting: Family Medicine

## 2019-04-10 ENCOUNTER — Encounter: Payer: Self-pay | Admitting: Family Medicine

## 2019-04-10 ENCOUNTER — Other Ambulatory Visit: Payer: Self-pay

## 2019-04-10 VITALS — BP 120/86 | Ht 66.0 in | Wt 207.0 lb

## 2019-04-10 DIAGNOSIS — I10 Essential (primary) hypertension: Secondary | ICD-10-CM

## 2019-04-10 DIAGNOSIS — E559 Vitamin D deficiency, unspecified: Secondary | ICD-10-CM

## 2019-04-10 DIAGNOSIS — E66811 Obesity, class 1: Secondary | ICD-10-CM

## 2019-04-10 DIAGNOSIS — E669 Obesity, unspecified: Secondary | ICD-10-CM

## 2019-04-10 DIAGNOSIS — Z1231 Encounter for screening mammogram for malignant neoplasm of breast: Secondary | ICD-10-CM

## 2019-04-10 DIAGNOSIS — J209 Acute bronchitis, unspecified: Secondary | ICD-10-CM

## 2019-04-10 DIAGNOSIS — E785 Hyperlipidemia, unspecified: Secondary | ICD-10-CM

## 2019-04-10 DIAGNOSIS — R7301 Impaired fasting glucose: Secondary | ICD-10-CM

## 2019-04-10 MED ORDER — PREDNISONE 10 MG PO TABS: 10.0000 mg | ORAL_TABLET | Freq: Two times a day (BID) | ORAL | 0 refills | Status: DC

## 2019-04-10 MED ORDER — AZITHROMYCIN 250 MG PO TABS: ORAL_TABLET | ORAL | 0 refills | Status: DC

## 2019-04-10 MED ORDER — BENZONATATE 100 MG PO CAPS: 100.0000 mg | ORAL_CAPSULE | Freq: Two times a day (BID) | ORAL | 0 refills | Status: DC | PRN

## 2019-04-10 NOTE — Patient Instructions (Addendum)
Annual physical exam with pap with MD early  April  Please schedule mammogram in March when due  Please self isolate until you get test results  Work excuse from 02/11 to return 04/15/2019, nurse please call pt  for fax # Please covid test today, need result before you can return to work so vital yo get test today  Antibiotic, decongestant and prednisone are prescribed, also use saline nasal flushes once daily  Please get fasting labs 1 week before April appointment, CBC, fasting lipid, cmp and EGFR and Vit D ([facility])  Thanks for choosing [facility], we consider it a privelige to serve you.

## 2019-04-10 NOTE — Progress Notes (Signed)
Virtual Visit via Telephone Note  I connected with Lashanna D Romanello on 04/10/19 at  1:40 PM EST by telephone and verified that I am speaking with the correct person using two identifiers.  Location: Patient: home Provider: office   I discussed the limitations, risks, security and privacy concerns of performing an evaluation and management service by telephone and the availability of in person appointments. I also discussed with the patient that there may be a patient responsible charge related to this service. The patient expressed understanding and agreed to proceed.   History of Present Illness:  3 day h/o dry cough, chills , sore throat, clear nasal drainage , no sputum, no fever, no known recent covid positive exposure however on the job, she is aware that there have been cases   Observations/Objective: BP 120/86   Ht 5\' 6"  (1.676 m)   Wt 207 lb (93.9 kg)   LMP 07/09/2010   BMI 33.41 kg/m  Good communication with no confusion and intact memory. Alert and oriented x 3 No signs of respiratory distress during speech, however coughing intermittently   Assessment and Plan: Acute bronchitis Decongestant and antibiotic are prescribed  Essential hypertension Controlled, no change in medication DASH diet and commitment to daily physical activity for a minimum of 30 minutes discussed and encouraged, as a part of hypertension management. The importance of attaining a healthy weight is also discussed.  BP/Weight 04/10/2019 12/11/2018 06/18/2018 04/18/2018 06/21/2017 12/26/2016 05/15/2016  Systolic BP 120 120 120 118 122 132 130  Diastolic BP 86 86 86 80 88 80 84  Wt. (Lbs) 207 207 207 207 210 215.5 219  BMI 33.41 33.41 33.41 33.41 30.13 34.78 35.35       Obesity (BMI 30.0-34.9)  Patient re-educated about  the importance of commitment to a  minimum of 150 minutes of exercise per week as able.  The importance of healthy food choices with portion control discussed, as well as eating  regularly and within a 12 hour window most days. The need to choose "clean , green" food 50 to 75% of the time is discussed, as well as to make water the primary drink and set a goal of 64 ounces water daily.    Weight /BMI 04/10/2019 12/11/2018 06/18/2018  WEIGHT 207 lb 207 lb 207 lb  HEIGHT 5\' 6"  5\' 6"  5\' 6"   BMI 33.41 kg/m2 33.41 kg/m2 33.41 kg/m2      Hyperlipidemia Hyperlipidemia:Low fat diet discussed and encouraged.   Lipid Panel  Lab Results  Component Value Date   CHOL 262 (H) 12/11/2018   HDL 52 12/11/2018   LDLCALC 178 (H) 12/11/2018   TRIG 174 (H) 12/11/2018   CHOLHDL 5.0 (H) 12/11/2018  Updated lab needed at/ before next visit.       Follow Up Instructions:    I discussed the assessment and treatment plan with the patient. The patient was provided an opportunity to ask questions and all were answered. The patient agreed with the plan and demonstrated an understanding of the instructions.   The patient was advised to call back or seek an in-person evaluation if the symptoms worsen or if the condition fails to improve as anticipated.  I provided 20 minutes of non-face-to-face time during this encounter.   12/13/2018, MD

## 2019-04-15 ENCOUNTER — Encounter: Payer: Self-pay | Admitting: Family Medicine

## 2019-04-15 DIAGNOSIS — J209 Acute bronchitis, unspecified: Secondary | ICD-10-CM | POA: Insufficient documentation

## 2019-04-15 NOTE — Assessment & Plan Note (Signed)
Hyperlipidemia:Low fat diet discussed and encouraged.   Lipid Panel  Lab Results  Component Value Date   CHOL 262 (H) 12/11/2018   HDL 52 12/11/2018   LDLCALC 178 (H) 12/11/2018   TRIG 174 (H) 12/11/2018   CHOLHDL 5.0 (H) 12/11/2018  Updated lab needed at/ before next visit.

## 2019-04-15 NOTE — Assessment & Plan Note (Signed)
  Patient re-educated about  the importance of commitment to a  minimum of 150 minutes of exercise per week as able.  The importance of healthy food choices with portion control discussed, as well as eating regularly and within a 12 hour window most days. The need to choose "clean , green" food 50 to 75% of the time is discussed, as well as to make water the primary drink and set a goal of 64 ounces water daily.    Weight /BMI 04/10/2019 12/11/2018 06/18/2018  WEIGHT 207 lb 207 lb 207 lb  HEIGHT 5\' 6"  5\' 6"  5\' 6"   BMI 33.41 kg/m2 33.41 kg/m2 33.41 kg/m2

## 2019-04-15 NOTE — Assessment & Plan Note (Signed)
Decongestant and antibiotic are prescribed 

## 2019-04-15 NOTE — Assessment & Plan Note (Signed)
Controlled, no change in medication DASH diet and commitment to daily physical activity for a minimum of 30 minutes discussed and encouraged, as a part of hypertension management. The importance of attaining a healthy weight is also discussed.  BP/Weight 04/10/2019 12/11/2018 06/18/2018 04/18/2018 06/21/2017 12/26/2016 05/15/2016  Systolic BP 120 120 120 118 122 132 130  Diastolic BP 86 86 86 80 88 80 84  Wt. (Lbs) 207 207 207 207 210 215.5 219  BMI 33.41 33.41 33.41 33.41 30.13 34.78 35.35

## 2019-04-23 ENCOUNTER — Other Ambulatory Visit: Payer: Self-pay | Admitting: Family Medicine

## 2019-04-27 ENCOUNTER — Other Ambulatory Visit: Payer: Self-pay | Admitting: Family Medicine

## 2019-04-28 ENCOUNTER — Other Ambulatory Visit: Payer: Self-pay | Admitting: *Deleted

## 2019-04-28 MED ORDER — MONTELUKAST SODIUM 10 MG PO TABS: 10.0000 mg | ORAL_TABLET | Freq: Every day | ORAL | 5 refills | Status: DC

## 2019-05-08 ENCOUNTER — Ambulatory Visit (HOSPITAL_COMMUNITY)
Admission: RE | Admit: 2019-05-08 | Discharge: 2019-05-08 | Disposition: A | Discharge status: Home / Self Care | Payer: BC Managed Care – PPO | Source: Ambulatory Visit | Attending: Family Medicine | Admitting: Family Medicine

## 2019-05-08 ENCOUNTER — Other Ambulatory Visit: Payer: Self-pay

## 2019-05-08 DIAGNOSIS — Z1231 Encounter for screening mammogram for malignant neoplasm of breast: Secondary | ICD-10-CM

## 2019-05-20 ENCOUNTER — Other Ambulatory Visit: Payer: Self-pay | Admitting: Family Medicine

## 2019-05-26 ENCOUNTER — Encounter: Payer: BC Managed Care – PPO | Admitting: Family Medicine

## 2019-05-28 ENCOUNTER — Other Ambulatory Visit: Payer: Self-pay | Admitting: Family Medicine

## 2019-05-29 ENCOUNTER — Inpatient Hospital Stay (HOSPITAL_COMMUNITY): Admission: RE | Admit: 2019-05-29 | Payer: BC Managed Care – PPO | Source: Ambulatory Visit

## 2019-06-01 ENCOUNTER — Other Ambulatory Visit: Payer: Self-pay | Admitting: Family Medicine

## 2019-06-04 ENCOUNTER — Encounter: Payer: BC Managed Care – PPO | Admitting: Family Medicine

## 2019-06-23 ENCOUNTER — Other Ambulatory Visit: Payer: Self-pay | Admitting: Family Medicine

## 2019-07-14 ENCOUNTER — Other Ambulatory Visit: Payer: Self-pay

## 2019-07-14 ENCOUNTER — Ambulatory Visit (HOSPITAL_COMMUNITY)
Admission: RE | Admit: 2019-07-14 | Discharge: 2019-07-14 | Disposition: A | Discharge status: Home / Self Care | Payer: BC Managed Care – PPO | Source: Ambulatory Visit | Attending: Family Medicine | Admitting: Family Medicine

## 2019-07-14 ENCOUNTER — Ambulatory Visit (HOSPITAL_COMMUNITY): Payer: BC Managed Care – PPO

## 2019-07-14 DIAGNOSIS — Z1231 Encounter for screening mammogram for malignant neoplasm of breast: Secondary | ICD-10-CM | POA: Diagnosis present

## 2019-07-18 ENCOUNTER — Other Ambulatory Visit: Payer: Self-pay | Admitting: Family Medicine

## 2019-08-28 ENCOUNTER — Encounter: Payer: Self-pay | Admitting: Family Medicine

## 2019-08-28 ENCOUNTER — Other Ambulatory Visit (HOSPITAL_COMMUNITY)
Admission: RE | Admit: 2019-08-28 | Discharge: 2019-08-28 | Disposition: A | Discharge status: Home / Self Care | Payer: BC Managed Care – PPO | Source: Ambulatory Visit | Attending: Family Medicine | Admitting: Family Medicine

## 2019-08-28 ENCOUNTER — Other Ambulatory Visit: Payer: Self-pay

## 2019-08-28 ENCOUNTER — Ambulatory Visit (INDEPENDENT_AMBULATORY_CARE_PROVIDER_SITE_OTHER): Payer: BC Managed Care – PPO | Admitting: Family Medicine

## 2019-08-28 ENCOUNTER — Other Ambulatory Visit (HOSPITAL_COMMUNITY)
Admission: RE | Admit: 2019-08-28 | Discharge: 2019-08-28 | Disposition: A | Discharge status: Home / Self Care | Payer: BC Managed Care – PPO | Attending: *Deleted | Admitting: *Deleted

## 2019-08-28 VITALS — BP 137/73 | HR 64 | Resp 16 | Ht 66.0 in | Wt 218.0 lb

## 2019-08-28 DIAGNOSIS — Z0000 Encounter for general adult medical examination without abnormal findings: Secondary | ICD-10-CM | POA: Diagnosis not present

## 2019-08-28 DIAGNOSIS — N760 Acute vaginitis: Secondary | ICD-10-CM

## 2019-08-28 DIAGNOSIS — N3000 Acute cystitis without hematuria: Secondary | ICD-10-CM

## 2019-08-28 DIAGNOSIS — Z124 Encounter for screening for malignant neoplasm of cervix: Secondary | ICD-10-CM

## 2019-08-28 LAB — POCT URINALYSIS DIP (CLINITEK)
Bilirubin, UA: NEGATIVE
Blood, UA: NEGATIVE
Glucose, UA: NEGATIVE mg/dL
Ketones, POC UA: NEGATIVE mg/dL
Nitrite, UA: NEGATIVE
POC PROTEIN,UA: NEGATIVE
Spec Grav, UA: 1.02 (ref 1.010–1.025)
Urobilinogen, UA: 2 E.U./dL — AB
pH, UA: 7 (ref 5.0–8.0)

## 2019-08-28 MED ORDER — CYCLOBENZAPRINE HCL 10 MG PO TABS: ORAL_TABLET | ORAL | 1 refills | Status: DC

## 2019-08-28 MED ORDER — PANTOPRAZOLE SODIUM 20 MG PO TBEC: 20.0000 mg | DELAYED_RELEASE_TABLET | Freq: Every day | ORAL | 1 refills | Status: DC

## 2019-08-28 NOTE — Patient Instructions (Addendum)
F/u with MD in 5 month, call if you need me sooner  Please get fasting labs today, include Vit D please    Stop all caffeine, and protonix dose is reduced  It is important that you exercise regularly at least 30 minutes 5 times a week. If you develop chest pain, have severe difficulty breathing, or feel very tired, stop exercising immediately and seek medical attention   Think about what you will eat, plan ahead. Choose " clean, green, fresh or frozen" over canned, processed or packaged foods which are more sugary, salty and fatty. 70 to 75% of food eaten should be vegetables and fruit. Three meals at set times with snacks allowed between meals, but they must be fruit or vegetables. Aim to eat over a 12 hour period , example 7 am to 7 pm, and STOP after  your last meal of the day. Drink water,generally about 64 ounces per day, no other drink is as healthy. Fruit juice is best enjoyed in a healthy way, by EATING the fruit.  Thanks for choosing [facility], we consider it a privelige to serve you.

## 2019-08-29 ENCOUNTER — Other Ambulatory Visit: Payer: Self-pay

## 2019-08-29 ENCOUNTER — Other Ambulatory Visit: Payer: Self-pay | Admitting: Family Medicine

## 2019-08-29 ENCOUNTER — Encounter: Payer: Self-pay | Admitting: Family Medicine

## 2019-08-29 DIAGNOSIS — E785 Hyperlipidemia, unspecified: Secondary | ICD-10-CM

## 2019-08-29 DIAGNOSIS — N3000 Acute cystitis without hematuria: Secondary | ICD-10-CM | POA: Insufficient documentation

## 2019-08-29 DIAGNOSIS — I10 Essential (primary) hypertension: Secondary | ICD-10-CM

## 2019-08-29 DIAGNOSIS — N760 Acute vaginitis: Secondary | ICD-10-CM | POA: Insufficient documentation

## 2019-08-29 LAB — COMPLETE METABOLIC PANEL WITH GFR
AG Ratio: 1.6 (calc) (ref 1.0–2.5)
ALT: 37 U/L — ABNORMAL HIGH (ref 6–29)
AST: 28 U/L (ref 10–35)
Albumin: 4.6 g/dL (ref 3.6–5.1)
Alkaline phosphatase (APISO): 59 U/L (ref 37–153)
BUN: 16 mg/dL (ref 7–25)
CO2: 30 mmol/L (ref 20–32)
Calcium: 10.6 mg/dL — ABNORMAL HIGH (ref 8.6–10.4)
Chloride: 105 mmol/L (ref 98–110)
Creat: 0.85 mg/dL (ref 0.50–1.05)
GFR, Est African American: 87 mL/min/{1.73_m2} (ref >=60)
GFR, Est Non African American: 75 mL/min/{1.73_m2} (ref >=60)
Globulin: 2.9 g/dL (calc) (ref 1.9–3.7)
Glucose, Bld: 83 mg/dL (ref 65–99)
Potassium: 4.1 mmol/L (ref 3.5–5.3)
Sodium: 140 mmol/L (ref 135–146)
Total Bilirubin: 0.8 mg/dL (ref 0.2–1.2)
Total Protein: 7.5 g/dL (ref 6.1–8.1)

## 2019-08-29 LAB — CBC
HCT: 42.4 % (ref 35.0–45.0)
Hemoglobin: 14.3 g/dL (ref 11.7–15.5)
MCH: 30.8 pg (ref 27.0–33.0)
MCHC: 33.7 g/dL (ref 32.0–36.0)
MCV: 91.4 fL (ref 80.0–100.0)
MPV: 10.2 fL (ref 7.5–12.5)
Platelets: 220 10*3/uL (ref 140–400)
RBC: 4.64 10*6/uL (ref 3.80–5.10)
RDW: 13.1 % (ref 11.0–15.0)
WBC: 4.5 10*3/uL (ref 3.8–10.8)

## 2019-08-29 LAB — CERVICOVAGINAL ANCILLARY ONLY
Bacterial Vaginitis (gardnerella): POSITIVE — AB
Candida Glabrata: NEGATIVE
Candida Vaginitis: POSITIVE — AB
Chlamydia: NEGATIVE
Comment: NEGATIVE
Comment: NEGATIVE
Comment: NEGATIVE
Comment: NEGATIVE
Comment: NEGATIVE
Comment: NORMAL
Neisseria Gonorrhea: NEGATIVE
Trichomonas: NEGATIVE

## 2019-08-29 LAB — CYTOLOGY - PAP
Comment: NEGATIVE
Diagnosis: NEGATIVE
High risk HPV: NEGATIVE

## 2019-08-29 LAB — LIPID PANEL
Cholesterol: 301 mg/dL — ABNORMAL HIGH (ref <200)
HDL: 59 mg/dL (ref >=50)
LDL Cholesterol (Calc): 207 mg/dL (calc) — ABNORMAL HIGH
Non-HDL Cholesterol (Calc): 242 mg/dL (calc) — ABNORMAL HIGH (ref <130)
Total CHOL/HDL Ratio: 5.1 (calc) — ABNORMAL HIGH (ref <5.0)
Triglycerides: 173 mg/dL — ABNORMAL HIGH (ref <150)

## 2019-08-29 LAB — VITAMIN D 25 HYDROXY (VIT D DEFICIENCY, FRACTURES): Vit D, 25-Hydroxy: 29 ng/mL — ABNORMAL LOW (ref 30–100)

## 2019-08-29 MED ORDER — FLUCONAZOLE 150 MG PO TABS
150.0000 mg | ORAL_TABLET | Freq: Once | ORAL | 0 refills | Status: AC
Start: 2019-08-29 — End: 2019-08-29

## 2019-08-29 MED ORDER — METRONIDAZOLE 0.75 % VA GEL: 1.0000 | Freq: Two times a day (BID) | VAGINAL | 0 refills | Status: DC

## 2019-08-29 MED ORDER — ROSUVASTATIN CALCIUM 20 MG PO TABS: 20.0000 mg | ORAL_TABLET | Freq: Every day | ORAL | 1 refills | Status: DC

## 2019-08-29 NOTE — Assessment & Plan Note (Signed)
Specimen sent for culture and wet prep

## 2019-08-29 NOTE — Progress Notes (Signed)
    Laurie Olson     MRN: [id number]      DOB: 07/24/1960  HPI: Patient is in for annual physical exam. C/o smelly urine and vaginal discharge  With odor  Immunization is reviewed , and  updated if needed.   PE: BP 137/73   Pulse 64   Resp 16   Ht 5\' 6"  (1.676 m)   Wt 218 lb (98.9 kg)   LMP 07/09/2010   SpO2 98%   BMI 35.19 kg/m   Pleasant  female, alert and oriented x 3, in no cardio-pulmonary distress. Afebrile. HEENT No facial trauma or asymetry. Sinuses non tender.  Extra occullar muscles intact.. External ears normal, . Neck: supple, no adenopathy,JVD or thyromegaly.No bruits.  Chest: Clear to ascultation bilaterally.No crackles or wheezes. Non tender to palpation  Breast: No asymetry,no masses or lumps. No tenderness. No nipple discharge or inversion. No axillary or supraclavicular adenopathy  Cardiovascular system; Heart sounds normal,  S1 and  S2 ,no S3.  No murmur, or thrill. Apical beat not displaced Peripheral pulses normal.  Abdomen: Soft, non tender, no organomegaly or masses. No bruits. Bowel sounds normal. No guarding, tenderness or rebound.   GU: External genitalia normal female genitalia , normal female distribution of hair. No lesions. Urethral meatus normal in size, no  Prolapse, no lesions visibly  Present. Bladder non tender. Vagina pink and moist , with no visible lesions , discharge present . Adequate pelvic support no  cystocele or rectocele noted Cervix pink and appears healthy, no lesions or ulcerations noted, no discharge noted from os Uterus normal size, no adnexal masses, no cervical motion or adnexal tenderness.   Musculoskeletal exam: Full ROM of spine, hips , shoulders and knees. No deformity ,swelling or crepitus noted. No muscle wasting or atrophy.   Neurologic: Cranial nerves 2 to 12 intact. Power, tone ,sensation and reflexes normal throughout. No disturbance in gait. No tremor.  Skin: Intact, no ulceration,  erythema , scaling or rash noted. Pigmentation normal throughout  Psych; Normal mood and affect. Judgement and concentration normal   Assessment & Plan:  Annual physical exam Annual exam as documented. Counseling done  re healthy lifestyle involving commitment to 150 minutes exercise per week, heart healthy diet, and attaining healthy weight.The importance of adequate sleep also discussed. Regular seat belt use and home safety, is also discussed. Changes in health habits are decided on by the patient with goals and time frames  set for achieving them. Immunization and cancer screening needs are specifically addressed at this visit.   Acute cystitis without hematuria [facility] abn will f/u on culture  Vaginitis and vulvovaginitis Specimen sent for culture and wet prep

## 2019-08-29 NOTE — Assessment & Plan Note (Signed)
[facility] abn will f/u on culture

## 2019-08-29 NOTE — Assessment & Plan Note (Signed)

## 2019-08-31 LAB — URINE CULTURE: Culture: 40000 — AB

## 2019-09-02 ENCOUNTER — Other Ambulatory Visit: Payer: Self-pay | Admitting: Family Medicine

## 2019-09-02 MED ORDER — AMPICILLIN 500 MG PO CAPS: ORAL_CAPSULE | ORAL | 0 refills | Status: DC

## 2019-09-03 ENCOUNTER — Telehealth: Payer: Self-pay | Admitting: Family Medicine

## 2019-09-03 NOTE — Telephone Encounter (Signed)
Pt advised of lab results with verbal understanding  

## 2019-09-03 NOTE — Telephone Encounter (Signed)
Returning a call for lab results

## 2019-10-27 ENCOUNTER — Other Ambulatory Visit: Payer: Self-pay | Admitting: Family Medicine

## 2019-10-30 ENCOUNTER — Encounter: Payer: BC Managed Care – PPO | Admitting: Family Medicine

## 2019-11-03 ENCOUNTER — Other Ambulatory Visit: Payer: Self-pay | Admitting: Family Medicine

## 2019-12-04 ENCOUNTER — Other Ambulatory Visit: Payer: Self-pay | Admitting: Family Medicine

## 2019-12-04 ENCOUNTER — Other Ambulatory Visit: Payer: BC Managed Care – PPO

## 2019-12-26 ENCOUNTER — Telehealth: Payer: Self-pay

## 2020-01-11 ENCOUNTER — Other Ambulatory Visit: Payer: Self-pay | Admitting: Family Medicine

## 2020-01-14 ENCOUNTER — Other Ambulatory Visit: Payer: Self-pay | Admitting: Family Medicine

## 2020-01-16 ENCOUNTER — Other Ambulatory Visit: Payer: Self-pay | Admitting: Family Medicine

## 2020-01-19 ENCOUNTER — Other Ambulatory Visit: Payer: Self-pay | Admitting: Family Medicine

## 2020-01-20 ENCOUNTER — Other Ambulatory Visit: Payer: Self-pay | Admitting: Family Medicine

## 2020-01-20 ENCOUNTER — Other Ambulatory Visit: Payer: Self-pay

## 2020-01-20 MED ORDER — METOPROLOL SUCCINATE ER 25 MG PO TB24
25.0000 mg | ORAL_TABLET | Freq: Every day | ORAL | 0 refills | Status: DC
Start: 2020-01-20 — End: 2020-02-26

## 2020-01-20 MED ORDER — MONTELUKAST SODIUM 10 MG PO TABS: 10.0000 mg | ORAL_TABLET | Freq: Every day | ORAL | 0 refills | Status: DC

## 2020-01-25 ENCOUNTER — Other Ambulatory Visit: Payer: Self-pay | Admitting: Family Medicine

## 2020-01-28 ENCOUNTER — Ambulatory Visit: Payer: BC Managed Care – PPO | Admitting: Family Medicine

## 2020-02-23 ENCOUNTER — Other Ambulatory Visit: Payer: Self-pay | Admitting: Family Medicine

## 2020-02-26 ENCOUNTER — Other Ambulatory Visit: Payer: Self-pay

## 2020-02-26 MED ORDER — METOPROLOL SUCCINATE ER 25 MG PO TB24
25.0000 mg | ORAL_TABLET | Freq: Every day | ORAL | 0 refills | Status: DC
Start: 2020-02-26 — End: 2020-04-05

## 2020-03-01 ENCOUNTER — Other Ambulatory Visit: Payer: Self-pay

## 2020-03-01 ENCOUNTER — Encounter: Payer: Self-pay | Admitting: Family Medicine

## 2020-03-01 ENCOUNTER — Telehealth (INDEPENDENT_AMBULATORY_CARE_PROVIDER_SITE_OTHER): Payer: BC Managed Care – PPO | Admitting: Family Medicine

## 2020-03-01 VITALS — Wt 200.0 lb

## 2020-03-01 DIAGNOSIS — M79674 Pain in right toe(s): Secondary | ICD-10-CM

## 2020-03-01 DIAGNOSIS — E785 Hyperlipidemia, unspecified: Secondary | ICD-10-CM

## 2020-03-01 DIAGNOSIS — K219 Gastro-esophageal reflux disease without esophagitis: Secondary | ICD-10-CM

## 2020-03-01 DIAGNOSIS — E66811 Obesity, class 1: Secondary | ICD-10-CM

## 2020-03-01 DIAGNOSIS — Z1231 Encounter for screening mammogram for malignant neoplasm of breast: Secondary | ICD-10-CM

## 2020-03-01 DIAGNOSIS — M79676 Pain in unspecified toe(s): Secondary | ICD-10-CM | POA: Insufficient documentation

## 2020-03-01 DIAGNOSIS — I10 Essential (primary) hypertension: Secondary | ICD-10-CM

## 2020-03-01 DIAGNOSIS — E790 Hyperuricemia without signs of inflammatory arthritis and tophaceous disease: Secondary | ICD-10-CM

## 2020-03-01 DIAGNOSIS — E669 Obesity, unspecified: Secondary | ICD-10-CM

## 2020-03-01 DIAGNOSIS — E559 Vitamin D deficiency, unspecified: Secondary | ICD-10-CM | POA: Diagnosis not present

## 2020-03-01 MED ORDER — INDOMETHACIN 25 MG PO CAPS: ORAL_CAPSULE | ORAL | 0 refills | Status: DC

## 2020-03-01 MED ORDER — PREDNISONE 5 MG PO TABS: 5.0000 mg | ORAL_TABLET | Freq: Two times a day (BID) | ORAL | 0 refills | Status: AC

## 2020-03-01 NOTE — Assessment & Plan Note (Signed)
Updated lab needed at/ before next visit.   

## 2020-03-01 NOTE — Assessment & Plan Note (Signed)
Controlled, no change in medication DASH diet and commitment to daily physical activity for a minimum of 30 minutes discussed and encouraged, as a part of hypertension management. The importance of attaining a healthy weight is also discussed.  BP/Weight 03/01/2020 08/28/2019 04/10/2019 12/11/2018 06/18/2018 04/18/2018 06/21/2017  Systolic BP - 137 120 120 120 660 122  Diastolic BP - 73 86 86 86 80 88  Wt. (Lbs) 200 218 207 207 207 207 210  BMI 32.28 35.19 33.41 33.41 33.41 33.41 30.13

## 2020-03-01 NOTE — Assessment & Plan Note (Signed)
Hyperlipidemia:Low fat diet discussed and encouraged.   Lipid Panel  Lab Results  Component Value Date   CHOL 301 (H) 08/28/2019   HDL 59 08/28/2019   LDLCALC 207 (H) 08/28/2019   TRIG 173 (H) 08/28/2019   CHOLHDL 5.1 (H) 08/28/2019  uncontrolled, reduce fried and fatty foods Updated lab needed at/ before next visit.

## 2020-03-01 NOTE — Progress Notes (Signed)
Virtual Visit via Telephone Note  I connected with Laurie Olson on 03/01/20 at  4:00 PM EST by telephone and verified that I am speaking with the correct person using two identifiers.  Location: Patient: work Provider: office   I discussed the limitations, risks, security and privacy concerns of performing an evaluation and management service by telephone and the availability of in person appointments. I also discussed with the patient that there may be a patient responsible charge related to this service. The patient expressed understanding and agreed to proceed.   History of Present Illness: 3 week h/o right great toe pain , swelling, red , hot, no known F/u chronic problems , updat rHM Denies recent fever or chills. Denies sinus pressure, nasal congestion, ear pain or sore throat. Denies chest congestion, productive cough or wheezing. Denies chest pains, palpitations and leg swelling Denies abdominal pain, nausea, vomiting,diarrhea or constipation.   Denies dysuria, frequency, hesitancy or incontinence. Denies headaches, seizures, numbness, or tingling. Denies depression, anxiety or insomnia. Denies skin break down or rash.       Observations/Objective: Wt 200 lb (90.7 kg)   LMP 07/09/2010   BMI 32.28 kg/m  Good communication with no confusion and intact memory. Alert and oriented x 3 No signs of respiratory distress during speech    Assessment and Plan: Essential hypertension Controlled, no change in medication DASH diet and commitment to daily physical activity for a minimum of 30 minutes discussed and encouraged, as a part of hypertension management. The importance of attaining a healthy weight is also discussed.  BP/Weight 03/01/2020 08/28/2019 04/10/2019 12/11/2018 06/18/2018 04/18/2018 06/21/2017  Systolic BP - 137 120 120 120 654 122  Diastolic BP - 73 86 86 86 80 88  Wt. (Lbs) 200 218 207 207 207 207 210  BMI 32.28 35.19 33.41 33.41 33.41 33.41 30.13        Hyperlipidemia Hyperlipidemia:Low fat diet discussed and encouraged.   Lipid Panel  Lab Results  Component Value Date   CHOL 301 (H) 08/28/2019   HDL 59 08/28/2019   LDLCALC 207 (H) 08/28/2019   TRIG 173 (H) 08/28/2019   CHOLHDL 5.1 (H) 08/28/2019  uncontrolled, reduce fried and fatty foods Updated lab needed at/ before next visit.      Obesity (BMI 30.0-34.9)  Patient re-educated about  the importance of commitment to a  minimum of 150 minutes of exercise per week as able.  The importance of healthy food choices with portion control discussed, as well as eating regularly and within a 12 hour window most days. The need to choose "clean , green" food 50 to 75% of the time is discussed, as well as to make water the primary drink and set a goal of 64 ounces water daily.    Weight /BMI 03/01/2020 08/28/2019 04/10/2019  WEIGHT 200 lb 218 lb 207 lb  HEIGHT - 5\' 6"  5\' 6"   BMI 32.28 kg/m2 35.19 kg/m2 33.41 kg/m2      Vitamin D deficiency Updated lab needed at/ before next visit.   GERD (gastroesophageal reflux disease) Controlled, no change in medication   Great toe pain 3 week history, check uric acid level Indocid and prednisone prescribed short term    Follow Up Instructions:    I discussed the assessment and treatment plan with the patient. The patient was provided an opportunity to ask questions and all were answered. The patient agreed with the plan and demonstrated an understanding of the instructions.   The patient was advised to call  back or seek an in-person evaluation if the symptoms worsen or if the condition fails to improve as anticipated.  I provided 22 minutes of non-face-to-face time during this encounter.   Tula Nakayama, MD

## 2020-03-01 NOTE — Patient Instructions (Addendum)
F/u in office with MD in 4  Months , call if you need me sooner, dAP at that visit  Labs this week, come no later than 4:30 Monday through Thursday, lipid, cmp and EGFr, Uric acid, TSH and vit D  Please schedule May mammogram  Two medications are prescribed for presumed gout flare, indomethacin and prednisone  Please invest in a scale so you can really track your weight   It is important that you exercise regularly at least 30 minutes 5 times a week. If you develop chest pain, have severe difficulty breathing, or feel very tired, stop exercising immediately and seek medical attention  Think about what you will eat, plan ahead. Choose " clean, green, fresh or frozen" over canned, processed or packaged foods which are more sugary, salty and fatty. 70 to 75% of food eaten should be vegetables and fruit. Three meals at set times with snacks allowed between meals, but they must be fruit or vegetables. Aim to eat over a 12 hour period , example 7 am to 7 pm, and STOP after  your last meal of the day. Drink water,generally about 64 ounces per day, no other drink is as healthy. Fruit juice is best enjoyed in a healthy way, by EATING the fruit. Thanks for choosing [facility], we consider it a privelige to serve you. Best for 2022!

## 2020-03-01 NOTE — Assessment & Plan Note (Signed)
3 week history, check uric acid level Indocid and prednisone prescribed short term

## 2020-03-01 NOTE — Assessment & Plan Note (Signed)
  Patient re-educated about  the importance of commitment to a  minimum of 150 minutes of exercise per week as able.  The importance of healthy food choices with portion control discussed, as well as eating regularly and within a 12 hour window most days. The need to choose "clean , green" food 50 to 75% of the time is discussed, as well as to make water the primary drink and set a goal of 64 ounces water daily.    Weight /BMI 03/01/2020 08/28/2019 04/10/2019  WEIGHT 200 lb 218 lb 207 lb  HEIGHT - 5\' 6"  5\' 6"   BMI 32.28 kg/m2 35.19 kg/m2 33.41 kg/m2

## 2020-03-01 NOTE — Assessment & Plan Note (Signed)
Controlled, no change in medication  

## 2020-03-09 LAB — CMP14+EGFR
ALT: 15 IU/L (ref 0–32)
AST: 13 IU/L (ref 0–40)
Albumin/Globulin Ratio: 1.7 (ref 1.2–2.2)
Albumin: 4.4 g/dL (ref 3.8–4.9)
Alkaline Phosphatase: 72 IU/L (ref 44–121)
BUN/Creatinine Ratio: 25 (ref 12–28)
BUN: 26 mg/dL (ref 8–27)
Bilirubin Total: 0.4 mg/dL (ref 0.0–1.2)
CO2: 24 mmol/L (ref 20–29)
Calcium: 10.8 mg/dL — ABNORMAL HIGH (ref 8.7–10.3)
Chloride: 107 mmol/L — ABNORMAL HIGH (ref 96–106)
Creatinine, Ser: 1.06 mg/dL — ABNORMAL HIGH (ref 0.57–1.00)
GFR calc Af Amer: 66 mL/min/{1.73_m2} (ref >59)
GFR calc non Af Amer: 57 mL/min/{1.73_m2} — ABNORMAL LOW (ref >59)
Globulin, Total: 2.6 g/dL (ref 1.5–4.5)
Glucose: 79 mg/dL (ref 65–99)
Potassium: 4 mmol/L (ref 3.5–5.2)
Sodium: 143 mmol/L (ref 134–144)
Total Protein: 7 g/dL (ref 6.0–8.5)

## 2020-03-09 LAB — URIC ACID: Uric Acid: 4.9 mg/dL (ref 3.0–7.2)

## 2020-03-09 LAB — LIPID PANEL
Chol/HDL Ratio: 2.3 ratio (ref 0.0–4.4)
Cholesterol, Total: 169 mg/dL (ref 100–199)
HDL: 74 mg/dL (ref >39)
LDL Chol Calc (NIH): 82 mg/dL (ref 0–99)
Triglycerides: 67 mg/dL (ref 0–149)
VLDL Cholesterol Cal: 13 mg/dL (ref 5–40)

## 2020-03-09 LAB — VITAMIN D 25 HYDROXY (VIT D DEFICIENCY, FRACTURES): Vit D, 25-Hydroxy: 31.8 ng/mL (ref 30.0–100.0)

## 2020-03-09 LAB — TSH: TSH: 0.854 u[IU]/mL (ref 0.450–4.500)

## 2020-03-16 ENCOUNTER — Other Ambulatory Visit: Payer: Self-pay | Admitting: Family Medicine

## 2020-04-04 ENCOUNTER — Other Ambulatory Visit: Payer: Self-pay | Admitting: Family Medicine

## 2020-04-05 ENCOUNTER — Other Ambulatory Visit: Payer: Self-pay | Admitting: Family Medicine

## 2020-04-20 ENCOUNTER — Other Ambulatory Visit: Payer: Self-pay | Admitting: Family Medicine

## 2020-05-10 ENCOUNTER — Other Ambulatory Visit: Payer: Self-pay | Admitting: Family Medicine

## 2020-05-13 ENCOUNTER — Other Ambulatory Visit: Payer: Self-pay | Admitting: Family Medicine

## 2020-05-29 ENCOUNTER — Other Ambulatory Visit: Payer: Self-pay | Admitting: Family Medicine

## 2020-06-03 ENCOUNTER — Other Ambulatory Visit: Payer: Self-pay | Admitting: Family Medicine

## 2020-06-17 ENCOUNTER — Other Ambulatory Visit: Payer: Self-pay | Admitting: Family Medicine

## 2020-06-21 ENCOUNTER — Other Ambulatory Visit: Payer: Self-pay | Admitting: Family Medicine

## 2020-06-28 ENCOUNTER — Other Ambulatory Visit: Payer: Self-pay | Admitting: Family Medicine

## 2020-06-30 ENCOUNTER — Ambulatory Visit: Payer: BC Managed Care – PPO | Admitting: Family Medicine

## 2020-07-09 ENCOUNTER — Other Ambulatory Visit: Payer: Self-pay | Admitting: Family Medicine

## 2020-07-13 ENCOUNTER — Other Ambulatory Visit: Payer: Self-pay

## 2020-07-13 MED ORDER — MONTELUKAST SODIUM 10 MG PO TABS: 1.0000 | ORAL_TABLET | Freq: Every day | ORAL | 5 refills | Status: DC

## 2020-07-13 MED ORDER — PANTOPRAZOLE SODIUM 20 MG PO TBEC: 20.0000 mg | DELAYED_RELEASE_TABLET | Freq: Every day | ORAL | 5 refills | Status: DC

## 2020-07-19 ENCOUNTER — Ambulatory Visit (HOSPITAL_COMMUNITY): Payer: BC Managed Care – PPO

## 2020-07-28 ENCOUNTER — Telehealth: Payer: Self-pay

## 2020-07-28 NOTE — Telephone Encounter (Signed)
Needs visit to address this proble

## 2020-07-28 NOTE — Telephone Encounter (Signed)
Didn't get a chance to call this pt back

## 2020-07-28 NOTE — Telephone Encounter (Signed)
Patient called asking if her muscle relaxers will help her right shoulder down to her wrist. Call back # [phone]

## 2020-07-29 NOTE — Telephone Encounter (Signed)
When available

## 2020-07-29 NOTE — Telephone Encounter (Signed)
Patient scheduled to see patel 08/06/20 at 10:20 as this is her next day off work

## 2020-07-29 NOTE — Telephone Encounter (Signed)
LVM for pt to call the office to set up appt  

## 2020-08-01 ENCOUNTER — Other Ambulatory Visit: Payer: Self-pay | Admitting: Family Medicine

## 2020-08-06 ENCOUNTER — Ambulatory Visit: Payer: BC Managed Care – PPO | Admitting: Nurse Practitioner

## 2020-08-06 ENCOUNTER — Other Ambulatory Visit: Payer: Self-pay

## 2020-08-06 ENCOUNTER — Encounter: Payer: Self-pay | Admitting: Nurse Practitioner

## 2020-08-06 DIAGNOSIS — M25511 Pain in right shoulder: Secondary | ICD-10-CM | POA: Diagnosis not present

## 2020-08-06 MED ORDER — PREDNISONE 10 MG PO TABS: ORAL_TABLET | ORAL | 0 refills | Status: AC

## 2020-08-06 MED ORDER — IBUPROFEN 800 MG PO TABS: 800.0000 mg | ORAL_TABLET | Freq: Three times a day (TID) | ORAL | 0 refills | Status: DC | PRN

## 2020-08-06 NOTE — Progress Notes (Signed)
Acute Office Visit  Subjective:    Patient ID: Laurie Olson, female    DOB: [birth year], 60 y.o.   MRN: [id number]  Chief Complaint  Patient presents with   Arm Pain    Right arm and shoulder pain has been going on and off for 1 month but has gotten worse in the last two weeks pain sometimes runs down to her wrist she has tried otc tylenol     Arm Pain   Patient is in today for right arm/shoulder pain that has been off and on for 1 month, but has gotten worse in the last 2 weeks. The pain radiates into her wrist. She has taken some OTC tylenol, but her pain is not relieved.  She rates her pain at 8/10. No known mechanism of injury.  Pain is worse when she raises her arm. She has been needing help with dressing d/t her pain.   Past Medical History:  Diagnosis Date   Allergic rhinitis    Anemia    Constipation    Hypertension     Past Surgical History:  Procedure Laterality Date   CESAREAN SECTION     x2    Family History  Problem Relation Age of Onset   Hypertension Mother 47   Hypertension Sister 10   Allergies Son    Eczema Son    Asthma Son     Social History   Socioeconomic History   Marital status: Divorced    Spouse name: Not on file   Number of children: Not on file   Years of education: Not on file   Highest education level: Not on file  Occupational History   Not on file  Tobacco Use   Smoking status: Former    Packs/day: 2.00    Pack years: 0.00    Types: Cigarettes   Smokeless tobacco: Never  Substance and Sexual Activity   Alcohol use: No   Drug use: No   Sexual activity: Not on file  Other Topics Concern   Not on file  Social History Narrative   Not on file   Social Determinants of Health   Financial Resource Strain: Not on file  Food Insecurity: Not on file  Transportation Needs: Not on file  Physical Activity: Not on file  Stress: Not on file  Social Connections: Not on file  Intimate Partner Violence: Not on file     Outpatient Medications Prior to Visit  Medication Sig Dispense Refill   amLODipine (NORVASC) 10 MG tablet Take 1 tablet by mouth once daily 30 tablet 0   betamethasone dipropionate 0.05 % cream Apply sparingly twice daily to hands for 1 week, then as needed 45 g 0   clotrimazole-betamethasone (LOTRISONE) cream APPLY TOPICALLY TWICE DAILY FOR 10 DAYS TO RASH, THEN AS NEEDED 45 g 0   cyclobenzaprine (FLEXERIL) 10 MG tablet Take one tablet at bedtime , as needed, for neck spasm 30 tablet 1   fluticasone (FLONASE) 50 MCG/ACT nasal spray Use 1 spray(s) in each nostril once daily 16 g 5   metoprolol succinate (TOPROL-XL) 25 MG 24 hr tablet Take 1 tablet by mouth once daily 30 tablet 0   metoprolol succinate (TOPROL-XL) 25 MG 24 hr tablet Take 1 tablet by mouth once daily 30 tablet 0   montelukast (SINGULAIR) 10 MG tablet Take 1 tablet (10 mg total) by mouth at bedtime. 30 tablet 5   pantoprazole (PROTONIX) 20 MG tablet Take 1 tablet (20 mg total) by mouth daily.  30 tablet 5   rosuvastatin (CRESTOR) 20 MG tablet Take 1 tablet by mouth once daily 30 tablet 0   Vitamin D, Ergocalciferol, (DRISDOL) 1.25 MG (50000 UNIT) CAPS capsule Take 1 capsule by mouth once a week 12 capsule 0   indomethacin (INDOCIN) 25 MG capsule Take one capsule  three times daly for 2 days, then take one capsule  two times daily for 2 days , then take one capsule once daily for 3 days , then stop 13 capsule 0   No facility-administered medications prior to visit.    Allergies  Allergen Reactions   Hydrocodone-Acetaminophen     REACTION: nausea, vomiting    Review of Systems  Constitutional: Negative.   Musculoskeletal:  Positive for arthralgias.       Right shoulder pain x 1 month, worse in last 2 weeks      Objective:    Physical Exam Constitutional:      Appearance: Normal appearance.  Musculoskeletal:     Comments: Limited ROM to Right shoulder; limited with abduction, extension/flexion, and  internal/external rotation   Neurological:     Mental Status: She is alert.    BP 127/82 (BP Location: Right Arm, Patient Position: Sitting, Cuff Size: Normal)   Pulse 76   Temp 98.4 F (36.9 C) (Oral)   Resp 18   Ht 5\' 6"  (1.676 m)   Wt 199 lb 6.4 oz (90.4 kg)   LMP 07/09/2010   SpO2 92%   BMI 32.18 kg/m  Wt Readings from Last 3 Encounters:  08/06/20 199 lb 6.4 oz (90.4 kg)  03/01/20 200 lb (90.7 kg)  08/28/19 218 lb (98.9 kg)    Health Maintenance Due  Topic Date Due   TETANUS/TDAP  11/30/2019   COVID-19 Vaccine (4 - Booster for Moderna series) 06/10/2020    There are no preventive care reminders to display for this patient.   Lab Results  Component Value Date   TSH 0.854 03/08/2020   Lab Results  Component Value Date   WBC 4.5 08/28/2019   HGB 14.3 08/28/2019   HCT 42.4 08/28/2019   MCV 91.4 08/28/2019   PLT 220 08/28/2019   Lab Results  Component Value Date   NA 143 03/08/2020   K 4.0 03/08/2020   CO2 24 03/08/2020   GLUCOSE 79 03/08/2020   BUN 26 03/08/2020   CREATININE 1.06 (H) 03/08/2020   BILITOT 0.4 03/08/2020   ALKPHOS 72 03/08/2020   AST 13 03/08/2020   ALT 15 03/08/2020   PROT 7.0 03/08/2020   ALBUMIN 4.4 03/08/2020   CALCIUM 10.8 (H) 03/08/2020   Lab Results  Component Value Date   CHOL 169 03/08/2020   Lab Results  Component Value Date   HDL 74 03/08/2020   Lab Results  Component Value Date   LDLCALC 82 03/08/2020   Lab Results  Component Value Date   TRIG 67 03/08/2020   Lab Results  Component Value Date   CHOLHDL 2.3 03/08/2020   Lab Results  Component Value Date   HGBA1C 5.4 05/15/2016       Assessment & Plan:   Problem List Items Addressed This Visit       Other   Right shoulder pain    -has limited ROM and tenderness to entire shoulder R shoulder; no deformity -Rx. Prednisone and ibuprofen -take tylenol 650 mg TID as well -DDx. Rotator cuff injury, cervical radiculopathy, bursitis        Relevant  Medications   ibuprofen (ADVIL) 800  MG tablet   predniSONE (DELTASONE) 10 MG tablet   Other Relevant Orders   Ambulatory referral to Orthopedic Surgery     Meds ordered this encounter  Medications   ibuprofen (ADVIL) 800 MG tablet    Sig: Take 1 tablet (800 mg total) by mouth every 8 (eight) hours as needed.    Dispense:  30 tablet    Refill:  0   predniSONE (DELTASONE) 10 MG tablet    Sig: Take 6 tablets (60 mg total) by mouth daily with breakfast for 1 day, THEN 5 tablets (50 mg total) daily with breakfast for 1 day, THEN 4 tablets (40 mg total) daily with breakfast for 1 day, THEN 3 tablets (30 mg total) daily with breakfast for 1 day, THEN 2 tablets (20 mg total) daily with breakfast for 1 day, THEN 1 tablet (10 mg total) daily with breakfast for 1 day.    Dispense:  21 tablet    Refill:  0     Heather Roberts, NP

## 2020-08-06 NOTE — Patient Instructions (Signed)
I recommend getting a sling to keep your shoulder in a position of comfort.  I sent in ibuprofen and prednisone to help with inflammation. You can also take tylenol 650 mg three times per day to help with pain.  I sent the referral to orthopedics, and they may get imaging or recommend further treatment.

## 2020-08-06 NOTE — Assessment & Plan Note (Signed)
-  has limited ROM and tenderness to entire shoulder R shoulder; no deformity -Rx. Prednisone and ibuprofen -take tylenol 650 mg TID as well -DDx. Rotator cuff injury, cervical radiculopathy, bursitis

## 2020-08-09 ENCOUNTER — Encounter: Payer: Self-pay | Admitting: Orthopedic Surgery

## 2020-08-09 ENCOUNTER — Other Ambulatory Visit: Payer: Self-pay | Admitting: Family Medicine

## 2020-08-16 ENCOUNTER — Other Ambulatory Visit: Payer: Self-pay | Admitting: Family Medicine

## 2020-09-12 ENCOUNTER — Other Ambulatory Visit: Payer: Self-pay | Admitting: Family Medicine

## 2020-09-13 ENCOUNTER — Other Ambulatory Visit: Payer: Self-pay | Admitting: Nurse Practitioner

## 2020-09-14 ENCOUNTER — Encounter: Payer: Self-pay | Admitting: Orthopaedic Surgery

## 2020-09-14 ENCOUNTER — Other Ambulatory Visit: Payer: Self-pay

## 2020-09-14 ENCOUNTER — Ambulatory Visit: Payer: BC Managed Care – PPO

## 2020-09-14 ENCOUNTER — Ambulatory Visit: Payer: BC Managed Care – PPO | Admitting: Orthopaedic Surgery

## 2020-09-14 VITALS — BP 152/98 | HR 67 | Ht 66.0 in | Wt 203.0 lb

## 2020-09-14 DIAGNOSIS — G8929 Other chronic pain: Secondary | ICD-10-CM

## 2020-09-14 DIAGNOSIS — M25511 Pain in right shoulder: Secondary | ICD-10-CM

## 2020-09-14 DIAGNOSIS — M542 Cervicalgia: Secondary | ICD-10-CM

## 2020-09-14 NOTE — Patient Instructions (Signed)
OT

## 2020-09-14 NOTE — Progress Notes (Signed)
Subjective:    Patient ID: Stevan Born, female    DOB: [birth year], 60 y.o.   MRN: [id number]  HPI She has about a two to three month history of shoulder pain on the right that is not getting better.  She has been seen at [facility].  I have reviewed their notes.  She has pain that radiates to the elbow at times.  She has no swelling, no redness. She has tried ice, Tylenol and Advil with little help.  She is not getting better. She has had dose of prednisone that helped a little.     Review of Systems  Constitutional:  Positive for activity change.  Musculoskeletal:  Positive for arthralgias.  All other systems reviewed and are negative. For Review of Systems, all other systems reviewed and are negative.  The following is a summary of the past history medically, past history surgically, known current medicines, social history and family history.  This information is gathered electronically by the computer from prior information and documentation.  I review this each visit and have found including this information at this point in the chart is beneficial and informative.   Past Medical History:  Diagnosis Date   Allergic rhinitis    Anemia    Constipation    Hypertension     Past Surgical History:  Procedure Laterality Date   CESAREAN SECTION     x2    Current Outpatient Medications on File Prior to Visit  Medication Sig Dispense Refill   amLODipine (NORVASC) 10 MG tablet Take 1 tablet by mouth once daily 30 tablet 0   betamethasone dipropionate 0.05 % cream Apply sparingly twice daily to hands for 1 week, then as needed 45 g 0   clotrimazole-betamethasone (LOTRISONE) cream APPLY TOPICALLY TWICE DAILY FOR 10 DAYS TO RASH, THEN AS NEEDED 45 g 0   cyclobenzaprine (FLEXERIL) 10 MG tablet Take one tablet at bedtime , as needed, for neck spasm 30 tablet 1   fluticasone (FLONASE) 50 MCG/ACT nasal spray Use 1 spray(s) in each nostril once daily 16 g 5   ibuprofen  (ADVIL) 800 MG tablet Take 1 tablet (800 mg total) by mouth every 8 (eight) hours as needed. 30 tablet 0   metoprolol succinate (TOPROL-XL) 25 MG 24 hr tablet Take 1 tablet by mouth once daily 30 tablet 0   montelukast (SINGULAIR) 10 MG tablet Take 1 tablet (10 mg total) by mouth at bedtime. 30 tablet 5   pantoprazole (PROTONIX) 20 MG tablet Take 1 tablet (20 mg total) by mouth daily. 30 tablet 5   rosuvastatin (CRESTOR) 20 MG tablet Take 1 tablet by mouth once daily 30 tablet 0   Vitamin D, Ergocalciferol, (DRISDOL) 1.25 MG (50000 UNIT) CAPS capsule Take 1 capsule by mouth once a week 12 capsule 0   No current facility-administered medications on file prior to visit.    Social History   Socioeconomic History   Marital status: Divorced    Spouse name: Not on file   Number of children: Not on file   Years of education: Not on file   Highest education level: Not on file  Occupational History   Not on file  Tobacco Use   Smoking status: Former    Packs/day: 2.00    Types: Cigarettes   Smokeless tobacco: Never  Substance and Sexual Activity   Alcohol use: No   Drug use: No   Sexual activity: Not on file  Other Topics Concern   Not  on file  Social History Narrative   Not on file   Social Determinants of Health   Financial Resource Strain: Not on file  Food Insecurity: Not on file  Transportation Needs: Not on file  Physical Activity: Not on file  Stress: Not on file  Social Connections: Not on file  Intimate Partner Violence: Not on file    Family History  Problem Relation Age of Onset   Hypertension Mother 3   Hypertension Sister 23   Allergies Son    Eczema Son    Asthma Son     BP (!) 152/98   Pulse 67   Ht 5\' 6"  (1.676 m)   Wt 203 lb (92.1 kg)   LMP 07/09/2010   BMI 32.77 kg/m   Body mass index is 32.77 kg/m.     Objective:   Physical Exam Vitals and nursing note reviewed. Exam conducted with a chaperone present.  Constitutional:      Appearance:  She is well-developed.  HENT:     Head: Normocephalic and atraumatic.  Eyes:     Conjunctiva/sclera: Conjunctivae normal.     Pupils: Pupils are equal, round, and reactive to light.  Cardiovascular:     Rate and Rhythm: Normal rate and regular rhythm.  Pulmonary:     Effort: Pulmonary effort is normal.  Abdominal:     Palpations: Abdomen is soft.  Musculoskeletal:       Arms:     Cervical back: Normal range of motion and neck supple.  Skin:    General: Skin is warm and dry.  Neurological:     Mental Status: She is alert and oriented to person, place, and time.     Cranial Nerves: No cranial nerve deficit.     Motor: No abnormal muscle tone.     Coordination: Coordination normal.     Deep Tendon Reflexes: Reflexes are normal and symmetric. Reflexes normal.  Psychiatric:        Behavior: Behavior normal.        Thought Content: Thought content normal.        Judgment: Judgment normal.   X-rays were done of the cervical spine and right shoulder, reported separately.       Assessment & Plan:   Encounter Diagnoses  Name Primary?   Chronic right shoulder pain Yes   Neck pain on right side    I will have her go to OT for ROM exercises of shoulder.  PROCEDURE NOTE:  The patient request injection, verbal consent was obtained.  The right shoulder was prepped appropriately after time out was performed.   Sterile technique was observed and injection of 1 cc of Celestone 6 mg with several cc's of plain xylocaine. Anesthesia was provided by ethyl chloride and a 20-gauge needle was used to inject the shoulder area. A posterior approach was used.  The injection was tolerated well.  A band aid dressing was applied.  The patient was advised to apply ice later today and tomorrow to the injection sight as needed.   Return in two weeks.  Call if any problem.  Precautions discussed.  Electronically Signed 09/08/2010, MD 7/19/202211:40 AM

## 2020-09-23 ENCOUNTER — Encounter (HOSPITAL_COMMUNITY): Payer: Self-pay | Admitting: Occupational Therapy

## 2020-09-23 ENCOUNTER — Ambulatory Visit (HOSPITAL_COMMUNITY): Payer: BC Managed Care – PPO | Attending: Orthopaedic Surgery | Admitting: Occupational Therapy

## 2020-09-23 ENCOUNTER — Other Ambulatory Visit: Payer: Self-pay

## 2020-09-23 DIAGNOSIS — M25511 Pain in right shoulder: Secondary | ICD-10-CM | POA: Diagnosis present

## 2020-09-23 DIAGNOSIS — R29898 Other symptoms and signs involving the musculoskeletal system: Secondary | ICD-10-CM | POA: Diagnosis present

## 2020-09-23 NOTE — Therapy (Signed)
[facility] [address] [place], [place], [address] Phone: [phone]   Fax:  [phone]  Occupational Therapy Evaluation  Patient Details  Name: Laurie Olson MRN: [id number] Date of Birth: 05/16/1960 Referring Provider (OT): Dr. Darreld Mclean   Encounter Date: 09/23/2020   OT End of Session - 09/23/20 1456     Visit Number 1    Number of Visits 8    Date for OT Re-Evaluation 10/23/20    Authorization Type BCBS    Authorization Time Period 30 visit limit    Authorization - Visit Number 1    Authorization - Number of Visits 30    OT Start Time 1421    OT Stop Time 1451    OT Time Calculation (min) 30 min    Activity Tolerance Patient tolerated treatment well    Behavior During Therapy [facility] for tasks assessed/performed             Past Medical History:  Diagnosis Date   Allergic rhinitis    Anemia    Constipation    Hypertension     Past Surgical History:  Procedure Laterality Date   CESAREAN SECTION     x2    There were no vitals filed for this visit.   Subjective Assessment - 09/23/20 1443     Subjective  S: I hurt my arm about a month ago.    Pertinent History Pt is a 60 y/o female presenting with right shoulder pain present for approximately 1 month, received cortisone injection on 09/14/20 with some relief of the dull aching pain. Pt had x-rays that were negative for acute changes, has not had MRI. Pt was referred to occupational therapy for evaluation and treatment by Dr. Darreld Mclean.    Special Tests FOTO: 49/100    Patient Stated Goals To have less pain and be able to move my arm.    Currently in Pain? Yes    Pain Score 7     Pain Location Shoulder    Pain Orientation Right    Pain Descriptors / Indicators Aching;Sharp;Sore    Pain Type Acute pain    Pain Radiating Towards N/A    Pain Onset More than a month ago    Pain Frequency Constant    Aggravating Factors  use, lifting, reaching    Pain  Relieving Factors pain medication, heat, ice    Effect of Pain on Daily Activities mod to max effect on ADLs and work tasks    Multiple Pain Sites No               [facility] OT Assessment - 09/23/20 1414       Assessment   Medical Diagnosis Right shoulder pain    Referring Provider (OT) Dr. 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, drive equipment    Leisure spending with granddaughters-53 year old twins      ADL   ADL comments Pt is having difficulty with dressing, reaching overhead, forward, and behind  back, lifting items, sleeping, lifting pots and pans      Written Expression   Dominant Hand Right      Cognition   Overall Cognitive Status Within Functional Limits for tasks assessed      Observation/Other Assessments   Focus on Therapeutic Outcomes (FOTO)  49/100      ROM / Strength   AROM / PROM / Strength AROM;PROM;Strength      Palpation   Palpation comment mod fascial restrictions in right upper arm, trapezius, and scapular regions      AROM   Overall AROM Comments Assessed seated, er/IR adducted    AROM Assessment Site Shoulder    Right/Left Shoulder Right    Right Shoulder Flexion 61 Degrees    Right Shoulder ABduction 81 Degrees    Right Shoulder Internal Rotation 90 Degrees    Right Shoulder External Rotation 32 Degrees      PROM   Overall PROM Comments Assessed supine, er/IR adducted    PROM Assessment Site Shoulder    Right/Left Shoulder Right    Right Shoulder Flexion 85 Degrees    Right Shoulder ABduction 65 Degrees    Right Shoulder Internal Rotation 90 Degrees    Right Shoulder External  Rotation 48 Degrees      Strength   Overall Strength Comments Assessed seated, er/IR adducted    Strength Assessment Site Shoulder    Right/Left Shoulder Right    Right Shoulder Flexion 2+/5    Right Shoulder ABduction 3-/5    Right Shoulder Internal Rotation 3/5    Right Shoulder External Rotation 3-/5                             OT Education - 09/23/20 1442     Education Details table slides    Person(s) Educated Patient    Methods Explanation;Demonstration;Handout    Comprehension Verbalized understanding;Returned demonstration              OT Short Term Goals - 09/23/20 1459       OT SHORT TERM GOAL #1   Title Pt will be provided with HEP to improve mobility of RUE required for ADL completion.    Time 4    Period Weeks    Status New    Target Date 10/23/20      OT SHORT TERM GOAL #2   Title Pt will decrease pain in RUE to 4/10 or less to improve ability to sleep for 3+ hours without waking due to pain.    Time 4    Period Weeks    Status New      OT SHORT TERM GOAL #3   Title Pt will increase RUE A/ROM to [facility] to improve ability to reach for items in overhead cabinets.    Time 4    Period Weeks    Status New      OT SHORT TERM GOAL #4   Title Pt will increase RUE strength to 4/5 or greater to improve ability to complete lifting tasks required by her job.    Time 4    Period Weeks    Status New      OT SHORT TERM GOAL #5   Title Pt will decrease RUE fascial restrictions to minimal amounts or less to improve ability to perform functional reaching tasks during ADLs and during work tasks.    Time 4    Period Weeks    Status New  Plan - 09/23/20 1457     Clinical Impression Statement A: Pt is a 60 y/o female presenting with right shoulder pain significantly limiting ability to participate in ADLs and work tasks using RUE as dominant. Pt reports guarding her arm a lot, tried a sling but was unable to wear.  Discussed POC with pt and if not improving after 4 weeks going back to MD for imaging as she has a visit limit.    OT Occupational Profile and History Problem Focused Assessment - Including review of records relating to presenting problem    Occupational performance deficits (Please refer to evaluation for details): ADL's;IADL's;Work;Rest and Sleep;Leisure    Body Structure / Function / Physical Skills ADL;Endurance;UE functional use;Muscle spasms;Fascial restriction;Pain;ROM;IADL;Strength    Rehab Potential Good    Clinical Decision Making Limited treatment options, no task modification necessary    Comorbidities Affecting Occupational Performance: None    Modification or Assistance to Complete Evaluation  No modification of tasks or assist necessary to complete eval    OT Frequency 2x / week    OT Duration 4 weeks    OT Treatment/Interventions Self-care/ADL training;Ultrasound;DME and/or AE instruction;Patient/family education;Passive range of motion;Cryotherapy;Electrical Stimulation;Moist Heat;Therapeutic exercise;Manual Therapy;Therapeutic activities    Plan P: Pt will benefit from skilled OT services to decrease pain and fascial restrictions, increase joint ROM, strength, and functional use of RUE. Treatment plan: myofascial release and manual techniques, P/ROM, AA/ROM, A/ROM, general RUE strengthening, scapular mobility/stability/strengthening, modalities prn    OT Home Exercise Plan eval: table slides    Consulted and Agree with Plan of Care Patient             Patient will benefit from skilled therapeutic intervention in order to improve the following deficits and impairments:   Body Structure / Function / Physical Skills: ADL, Endurance, UE functional use, Muscle spasms, Fascial restriction, Pain, ROM, IADL, Strength       Visit Diagnosis: Acute pain of right shoulder  Other symptoms and signs involving the musculoskeletal system    Problem List Patient Active Problem  List   Diagnosis Date Noted   Right shoulder pain 08/06/2020   Great toe pain 03/01/2020   GERD (gastroesophageal reflux disease) 06/18/2018   Colon cancer screening 09/01/2013   Metabolic syndrome X 10/20/2012   Vitamin D deficiency 10/16/2012   Cervical neck pain with evidence of disc disease 11/06/2011   Hyperlipidemia 08/26/2010   Obesity (BMI 30.0-34.9) 01/16/2010   Seasonal allergies 06/07/2007   Essential hypertension 03/06/2006    Ezra Sites, OTR/L  [phone] 09/23/2020, 3:02 PM  [facility] [address] [place], [place], [address] Phone: [phone]   Fax:  [phone]  Name: BARRI NEIDLINGER MRN: [id number] Date of Birth: [birth year]

## 2020-09-23 NOTE — Patient Instructions (Signed)
1) SHOULDER: Flexion On Table   Place hands on towel placed on table, elbows straight. Lean forward with you upper body, pushing towel away from body.  __10-15_ reps per set, __2-3_ sets per day  2) Abduction (Passive)   With arm out to side, resting on towel placed on table with palm DOWN, keeping trunk away from table, lean to the side while pushing towel away from body.  Repeat __10-15__ times. Do __2-3__ sessions per day.  Copyright  VHI. All rights reserved.     3) Internal Rotation (Assistive)   Seated with elbow bent at right angle and held against side, slide arm on table surface in an inward arc keeping elbow anchored in place. Repeat __10-15__ times. Do __2-3__ sessions per day. Activity: Use this motion to brush crumbs off the table.  Copyright  VHI. All rights reserved.   

## 2020-09-28 ENCOUNTER — Ambulatory Visit (INDEPENDENT_AMBULATORY_CARE_PROVIDER_SITE_OTHER): Payer: BC Managed Care – PPO | Admitting: Orthopaedic Surgery

## 2020-09-28 ENCOUNTER — Other Ambulatory Visit: Payer: Self-pay

## 2020-09-28 ENCOUNTER — Encounter: Payer: Self-pay | Admitting: Orthopaedic Surgery

## 2020-09-28 VITALS — BP 131/79 | HR 87 | Ht 66.0 in | Wt 205.0 lb

## 2020-09-28 DIAGNOSIS — G8929 Other chronic pain: Secondary | ICD-10-CM | POA: Diagnosis not present

## 2020-09-28 DIAGNOSIS — M25511 Pain in right shoulder: Secondary | ICD-10-CM | POA: Diagnosis not present

## 2020-09-28 NOTE — Progress Notes (Signed)
My shoulder is still hurting.  She has been to OT only once.  It helped some but she had some pain after it was over.  She is doing exercises at home.  I have reviewed the OT notes.  She has pain of the right shoulder, more in the extremes.  NV intact.  ROM of neck is full.  Encounter Diagnosis  Name Primary?   Chronic right shoulder pain Yes   Continue OT.  Return in one month.  Continue medicines.  Call if any problem.  Precautions discussed.  Electronically Signed Darreld Mclean, MD 8/2/20223:36 PM

## 2020-09-30 ENCOUNTER — Other Ambulatory Visit: Payer: Self-pay | Admitting: Family Medicine

## 2020-09-30 ENCOUNTER — Encounter (HOSPITAL_COMMUNITY): Payer: Self-pay | Admitting: Occupational Therapy

## 2020-09-30 ENCOUNTER — Other Ambulatory Visit: Payer: Self-pay

## 2020-09-30 ENCOUNTER — Ambulatory Visit (HOSPITAL_COMMUNITY): Payer: BC Managed Care – PPO | Attending: Orthopaedic Surgery | Admitting: Occupational Therapy

## 2020-09-30 ENCOUNTER — Telehealth: Payer: Self-pay

## 2020-09-30 DIAGNOSIS — R29898 Other symptoms and signs involving the musculoskeletal system: Secondary | ICD-10-CM | POA: Insufficient documentation

## 2020-09-30 DIAGNOSIS — M25511 Pain in right shoulder: Secondary | ICD-10-CM | POA: Insufficient documentation

## 2020-09-30 NOTE — Telephone Encounter (Signed)
FMLA   Noted Copied  Sleeved  

## 2020-09-30 NOTE — Therapy (Signed)
[facility] [address] [place], [place], [address] Phone: [phone]   Fax:  [phone]  Occupational Therapy Treatment  Patient Details  Name: Laurie Olson MRN: [id number] Date of Birth: 1961-01-21 Referring Provider (OT): Dr. Darreld Mclean   Encounter Date: 09/30/2020   OT End of Session - 09/30/20 1340     Visit Number 2    Number of Visits 8    Date for OT Re-Evaluation 10/23/20    Authorization Type BCBS    Authorization Time Period 30 visit limit    Authorization - Visit Number 2    Authorization - Number of Visits 30    OT Start Time 1307   pt was checked in late   OT Stop Time 1340    OT Time Calculation (min) 33 min    Activity Tolerance Patient tolerated treatment well    Behavior During Therapy [facility] for tasks assessed/performed             Past Medical History:  Diagnosis Date   Allergic rhinitis    Anemia    Constipation    Hypertension     Past Surgical History:  Procedure Laterality Date   CESAREAN SECTION     x2    There were no vitals filed for this visit.   Subjective Assessment - 09/30/20 1310     Subjective  S: I've been working so it's hurting.    Currently in Pain? Yes    Pain Score 4     Pain Location Shoulder    Pain Orientation Right    Pain Descriptors / Indicators Aching;Sore    Pain Type Acute pain    Pain Radiating Towards N/A    Pain Onset More than a month ago    Pain Frequency Constant    Aggravating Factors  work, use, lifting, reaching    Pain Relieving Factors pain medication, heat, ice    Effect of Pain on Daily Activities mod to max effect on ADLs                [facility] OT Assessment - 09/30/20 1310       Assessment   Medical Diagnosis Right shoulder pain                      OT Treatments/Exercises (OP) - 09/30/20 1310       Exercises   Exercises Shoulder      Shoulder Exercises: Supine   Protraction PROM;5 reps    Horizontal ABduction  PROM;5 reps    External Rotation PROM;5 reps    Internal Rotation PROM;5 reps    Flexion PROM;5 reps    ABduction PROM;5 reps      Shoulder Exercises: Seated   Elevation AROM;10 reps    Extension AROM;10 reps    Row AROM;10 reps    Other Seated Exercises scapular depression, A/ROM, 10X      Shoulder Exercises: Therapy Ball   Flexion 10 reps    ABduction 10 reps      Manual Therapy   Manual Therapy Myofascial release    Manual therapy comments completed separately from therapeutic exercises    Myofascial Release myofascial release and manual techniques to right upper arm, anterior shoulder, trapezius and scapular regions to decrease pain and fascial restrictions and increase joint ROM                      OT Short Term Goals -  09/30/20 1331       OT SHORT TERM GOAL #1   Title Pt will be provided with HEP to improve mobility of RUE required for ADL completion.    Time 4    Period Weeks    Status On-going    Target Date 10/23/20      OT SHORT TERM GOAL #2   Title Pt will decrease pain in RUE to 4/10 or less to improve ability to sleep for 3+ hours without waking due to pain.    Time 4    Period Weeks    Status On-going      OT SHORT TERM GOAL #3   Title Pt will increase RUE A/ROM to [facility] to improve ability to reach for items in overhead cabinets.    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    Time 4    Period Weeks    Status On-going      OT SHORT TERM GOAL #5   Title Pt will decrease RUE fascial restrictions to minimal amounts or less to improve ability to perform functional reaching tasks during ADLs and during work tasks.    Time 4    Period Weeks    Status On-going                      Plan - 09/30/20 1327     Clinical Impression Statement A: Pt reports decreased pain today as she did  not work a full day. Initiated myofascial release to address fascial restrictions, passive stretching completed. Pt with guarding during passive stretching, acheiving ROM at approximately 50% for flexion and abduction, 30 degress for er. Attempted AA/ROM in supine however pt unable to complete due to pain and catching. Completed scapular A/ROM, verbal cuing for relaxing shoulders and not engaging trapezius. Pt completing therapy ball stretches. Verbal cuing for form and technique.    Body Structure / Function / Physical Skills ADL;Endurance;UE functional use;Muscle spasms;Fascial restriction;Pain;ROM;IADL;Strength    Plan P: continue with myofascial release, passive stretching working to decrease guarding and improve ROM    Consulted and Agree with Plan of Care Patient             Patient will benefit from skilled therapeutic intervention in order to improve the following deficits and impairments:   Body Structure / Function / Physical Skills: ADL, Endurance, UE functional use, Muscle spasms, Fascial restriction, Pain, ROM, IADL, Strength       Visit Diagnosis: Acute pain of right shoulder  Other symptoms and signs involving the musculoskeletal system    Problem List Patient Active Problem List   Diagnosis Date Noted   Right shoulder pain 08/06/2020   Great toe pain 03/01/2020   GERD (gastroesophageal reflux disease) 06/18/2018   Colon cancer screening 09/01/2013   Metabolic syndrome X 10/20/2012   Vitamin D deficiency 10/16/2012   Cervical neck pain with evidence of disc disease 11/06/2011   Hyperlipidemia 08/26/2010   Obesity (BMI 30.0-34.9) 01/16/2010   Seasonal allergies 06/07/2007   Essential hypertension 03/06/2006    Ezra Sites, OTR/L  [phone] 09/30/2020, 2:23 PM  [facility] [address] [place], [place], [address] Phone: [phone]   Fax:  [phone]  Name: Laurie Olson MRN: [id number]  Date of Birth:  [birth year]

## 2020-10-04 ENCOUNTER — Other Ambulatory Visit: Payer: Self-pay

## 2020-10-04 ENCOUNTER — Ambulatory Visit (HOSPITAL_COMMUNITY): Payer: BC Managed Care – PPO

## 2020-10-04 ENCOUNTER — Encounter (HOSPITAL_COMMUNITY): Payer: Self-pay

## 2020-10-04 DIAGNOSIS — M25511 Pain in right shoulder: Secondary | ICD-10-CM | POA: Diagnosis not present

## 2020-10-04 DIAGNOSIS — R29898 Other symptoms and signs involving the musculoskeletal system: Secondary | ICD-10-CM

## 2020-10-04 NOTE — Therapy (Signed)
[facility] [address] [place], [place], [address] Phone: [phone]   Fax:  [phone]  Occupational Therapy Treatment  Patient Details  Name: RAYME BUI MRN: [id number] Date of Birth: [birth year] Referring Provider (OT): Dr. Darreld Mclean   Encounter Date: 10/04/2020   OT End of Session - 10/04/20 1555     Visit Number 3    Number of Visits 8    Date for OT Re-Evaluation 10/23/20    Authorization Type BCBS    Authorization Time Period 30 visit limit    Authorization - Visit Number 3    Authorization - Number of Visits 30    OT Start Time 1300    OT Stop Time 1338    OT Time Calculation (min) 38 min    Activity Tolerance Patient tolerated treatment well    Behavior During Therapy WFL for tasks assessed/performed             Past Medical History:  Diagnosis Date   Allergic rhinitis    Anemia    Constipation    Hypertension     Past Surgical History:  Procedure Laterality Date   CESAREAN SECTION     x2    There were no vitals filed for this visit.   Subjective Assessment - 10/04/20 1336     Subjective  S: I try not to do much with it. But i'm trying to work on not protecting it.    Currently in Pain? Yes    Pain Score 5     Pain Location Shoulder    Pain Orientation Right    Pain Descriptors / Indicators Aching;Sore    Pain Type Acute pain    Pain Onset More than a month ago    Pain Frequency Constant    Aggravating Factors  work, use, lifting, reaching    Pain Relieving Factors pain medication(does not like to take this), heat, ice    Effect of Pain on Daily Activities max effect    Multiple Pain Sites No                [facility] OT Assessment - 10/04/20 1339       Assessment   Medical Diagnosis Right shoulder pain      Precautions   Precautions None                      OT Treatments/Exercises (OP) - 10/04/20 1339       Exercises   Exercises Shoulder      Shoulder  Exercises: Supine   Protraction PROM;AAROM;5 reps    Horizontal ABduction PROM;5 reps    External Rotation PROM;5 reps;AAROM;10 reps    Internal Rotation PROM;5 reps;AAROM;10 reps    Flexion PROM;5 reps    ABduction PROM;5 reps      Shoulder Exercises: Seated   Row AROM;10 reps    Other Seated Exercises scapular depression, A/ROM, 10X      Manual Therapy   Manual Therapy Myofascial release    Manual therapy comments completed separately from therapeutic exercises    Myofascial Release myofascial release and manual techniques to right upper arm, anterior shoulder, trapezius and scapular regions to decrease pain and fascial restrictions and increase joint ROM                      OT Short Term Goals - 09/30/20 1331       OT SHORT TERM GOAL #1  Title Pt will be provided with HEP to improve mobility of RUE required for ADL completion.    Time 4    Period Weeks    Status On-going    Target Date 10/23/20      OT SHORT TERM GOAL #2   Title Pt will decrease pain in RUE to 4/10 or less to improve ability to sleep for 3+ hours without waking due to pain.    Time 4    Period Weeks    Status On-going      OT SHORT TERM GOAL #3   Title Pt will increase RUE A/ROM to [facility] to improve ability to reach for items in overhead cabinets.    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    Time 4    Period Weeks    Status On-going      OT SHORT TERM GOAL #5   Title Pt will decrease RUE fascial restrictions to minimal amounts or less to improve ability to perform functional reaching tasks during ADLs and during work tasks.    Time 4    Period Weeks    Status On-going                      Plan - 10/04/20 1556     Clinical Impression Statement A: Manual techniques were completed to address moderate fascial restrictions  located in the right upper arm and upper trapezius. Patient continues to demonstrate muscle guarding although improvement was noted when provided with verbal cues and strategies to decrease. Able to complete AA/ROM protraction and IR/er supine this date. VC for form and technique were provided.    Body Structure / Function / Physical Skills ADL;Endurance;UE functional use;Muscle spasms;Fascial restriction;Pain;ROM;IADL;Strength    Plan P: Attempt 10 repetitions for protraction supine AA/ROM. Continue to work on increasing ROM with less guarding.    Consulted and Agree with Plan of Care Patient             Patient will benefit from skilled therapeutic intervention in order to improve the following deficits and impairments:   Body Structure / Function / Physical Skills: ADL, Endurance, UE functional use, Muscle spasms, Fascial restriction, Pain, ROM, IADL, Strength       Visit Diagnosis: Other symptoms and signs involving the musculoskeletal system  Acute pain of right shoulder    Problem List Patient Active Problem List   Diagnosis Date Noted   Right shoulder pain 08/06/2020   Great toe pain 03/01/2020   GERD (gastroesophageal reflux disease) 06/18/2018   Colon cancer screening 09/01/2013   Metabolic syndrome X 10/20/2012   Vitamin D deficiency 10/16/2012   Cervical neck pain with evidence of disc disease 11/06/2011   Hyperlipidemia 08/26/2010   Obesity (BMI 30.0-34.9) 01/16/2010   Seasonal allergies 06/07/2007   Essential hypertension 03/06/2006    Limmie Patricia, OTR/L,CBIS  [phone]  10/04/2020, 3:58 PM  [facility] [address] [place], [place], [address] Phone: [phone]   Fax:  [phone]  Name: EMMILEE REAMER MRN: [id number] Date of Birth: 02-26-1961

## 2020-10-07 ENCOUNTER — Other Ambulatory Visit: Payer: Self-pay

## 2020-10-07 ENCOUNTER — Telehealth: Payer: Self-pay | Admitting: Orthopaedic Surgery

## 2020-10-07 ENCOUNTER — Encounter (HOSPITAL_COMMUNITY): Payer: Self-pay | Admitting: Occupational Therapy

## 2020-10-07 ENCOUNTER — Ambulatory Visit (HOSPITAL_COMMUNITY): Payer: BC Managed Care – PPO | Admitting: Occupational Therapy

## 2020-10-07 DIAGNOSIS — R29898 Other symptoms and signs involving the musculoskeletal system: Secondary | ICD-10-CM

## 2020-10-07 DIAGNOSIS — M25511 Pain in right shoulder: Secondary | ICD-10-CM

## 2020-10-07 NOTE — Therapy (Signed)
[facility] [address] [place], [place], [address] Phone: [phone]   Fax:  [phone]  Occupational Therapy Treatment  Patient Details  Name: Laurie Olson MRN: [id number] Date of Birth: [birth year] Referring Provider (OT): Dr. Darreld Mclean   Encounter Date: 10/07/2020   OT End of Session - 10/07/20 1433     Visit Number 4    Number of Visits 8    Date for OT Re-Evaluation 10/23/20    Authorization Type BCBS    Authorization Time Period 30 visit limit    Authorization - Visit Number 4    Authorization - Number of Visits 30    OT Start Time 1347    OT Stop Time 1430    OT Time Calculation (min) 43 min    Activity Tolerance Patient tolerated treatment well    Behavior During Therapy [facility] for tasks assessed/performed             Past Medical History:  Diagnosis Date   Allergic rhinitis    Anemia    Constipation    Hypertension     Past Surgical History:  Procedure Laterality Date   CESAREAN SECTION     x2    There were no vitals filed for this visit.   Subjective Assessment - 10/07/20 1347     Subjective  S: It's feeling better a little bit.    Currently in Pain? No/denies                [facility] OT Assessment - 10/07/20 1347       Assessment   Medical Diagnosis Right shoulder pain      Precautions   Precautions None                      OT Treatments/Exercises (OP) - 10/07/20 1349       Exercises   Exercises Shoulder      Shoulder Exercises: Supine   Protraction PROM;5 reps;AAROM;10 reps    Horizontal ABduction PROM;5 reps    External Rotation PROM;5 reps;AAROM;10 reps    Internal Rotation PROM;5 reps;AAROM;10 reps    Flexion PROM;5 reps    ABduction PROM;5 reps      Shoulder Exercises: Seated   Extension AROM;10 reps    Row AROM;10 reps      Shoulder Exercises: Therapy Ball   Flexion 10 reps    ABduction 10 reps      Shoulder Exercises: ROM/Strengthening   Thumb  Tacks 1' low level      Modalities   Modalities Electrical Stimulation      Electrical Stimulation   Electrical Stimulation Location right shoulder    Electrical Stimulation Action interferential    Electrical Stimulation Parameters 5.8CV    Electrical Stimulation Goals Pain      Manual Therapy   Manual Therapy Myofascial release    Manual therapy comments completed separately from therapeutic exercises    Myofascial Release myofascial release and manual techniques to right upper arm, anterior shoulder, trapezius and scapular regions to decrease pain and fascial restrictions and increase joint ROM                      OT Short Term Goals - 09/30/20 1331       OT SHORT TERM GOAL #1   Title Pt will be provided with HEP to improve mobility of RUE required for ADL completion.    Time 4    Period  Weeks    Status On-going    Target Date 10/23/20      OT SHORT TERM GOAL #2   Title Pt will decrease pain in RUE to 4/10 or less to improve ability to sleep for 3+ hours without waking due to pain.    Time 4    Period Weeks    Status On-going      OT SHORT TERM GOAL #3   Title Pt will increase RUE A/ROM to [facility] to improve ability to reach for items in overhead cabinets.    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    Time 4    Period Weeks    Status On-going      OT SHORT TERM GOAL #5   Title Pt will decrease RUE fascial restrictions to minimal amounts or less to improve ability to perform functional reaching tasks during ADLs and during work tasks.    Time 4    Period Weeks    Status On-going                      Plan - 10/07/20 1427     Clinical Impression Statement A: Pt reports no pain at beginning of session, has had a better day. Continued with myofascial release to address fascial restrictions,  passive stretching with pt able to tolerate full ROM. Increased AA/ROM to 10 reps for protraction, attempted flexion however unable to complete due to increased pain. Pt completing scapular A/ROM, added thumb tacks, and continued with therapy ball exercises. Pt reporting pain at 8/10 therefore trialed ES for pain management at end of session. Pt reporting pain down to 6/10 or 7/10 after ES.    Body Structure / Function / Physical Skills ADL;Endurance;UE functional use;Muscle spasms;Fascial restriction;Pain;ROM;IADL;Strength    Plan P: Attempt flexion AA/ROM in supine, wall wash    OT Home Exercise Plan eval: table slides    Consulted and Agree with Plan of Care Patient             Patient will benefit from skilled therapeutic intervention in order to improve the following deficits and impairments:   Body Structure / Function / Physical Skills: ADL, Endurance, UE functional use, Muscle spasms, Fascial restriction, Pain, ROM, IADL, Strength       Visit Diagnosis: Other symptoms and signs involving the musculoskeletal system  Acute pain of right shoulder    Problem List Patient Active Problem List   Diagnosis Date Noted   Right shoulder pain 08/06/2020   Great toe pain 03/01/2020   GERD (gastroesophageal reflux disease) 06/18/2018   Colon cancer screening 09/01/2013   Metabolic syndrome X 10/20/2012   Vitamin D deficiency 10/16/2012   Cervical neck pain with evidence of disc disease 11/06/2011   Hyperlipidemia 08/26/2010   Obesity (BMI 30.0-34.9) 01/16/2010   Seasonal allergies 06/07/2007   Essential hypertension 03/06/2006    Ezra Sites, OTR/L  [phone] 10/07/2020, 2:33 PM  [facility] [address] [place], [place], [address] Phone: [phone]   Fax:  [phone]  Name: MONITA SWIER MRN: [id number] Date of Birth: 29-Jan-1961

## 2020-10-07 NOTE — Telephone Encounter (Signed)
Patient came and picked up forms to take to [facility]ias office to fill out

## 2020-10-07 NOTE — Telephone Encounter (Signed)
Patient brought in a [employer] form for FMLA intermittent leave, requesting to have completed. Discussed [employer] forms protocol, understands. Note being issued for estimated/intermittent leave. Patient also asked about form of payment, as states she uses her health benefits card for everything. Reviewed payment forms accepted by [employer] - check or money order - patient will let us know when she can return with [employer] paperwork and her form.

## 2020-10-12 ENCOUNTER — Encounter (HOSPITAL_COMMUNITY): Payer: Self-pay | Admitting: Occupational Therapy

## 2020-10-12 ENCOUNTER — Ambulatory Visit (HOSPITAL_COMMUNITY): Payer: BC Managed Care – PPO | Admitting: Occupational Therapy

## 2020-10-12 ENCOUNTER — Other Ambulatory Visit: Payer: Self-pay

## 2020-10-12 DIAGNOSIS — M25511 Pain in right shoulder: Secondary | ICD-10-CM | POA: Diagnosis not present

## 2020-10-12 DIAGNOSIS — R29898 Other symptoms and signs involving the musculoskeletal system: Secondary | ICD-10-CM

## 2020-10-12 NOTE — Therapy (Signed)
[facility] [address] [place], [place], [address] Phone: [phone]   Fax:  [phone]  Occupational Therapy Treatment  Patient Details  Name: Laurie Olson MRN: [id number] Date of Birth: 10/12/1960 Referring Provider (OT): Dr. Darreld Mclean   Encounter Date: 10/12/2020   OT End of Session - 10/12/20 1409     Visit Number 5    Number of Visits 8    Date for OT Re-Evaluation 10/23/20    Authorization Type BCBS    Authorization Time Period 30 visit limit    Authorization - Visit Number 5    Authorization - Number of Visits 30    OT Start Time 1302    OT Stop Time 1343    OT Time Calculation (min) 41 min    Activity Tolerance Patient tolerated treatment well    Behavior During Therapy [facility] for tasks assessed/performed             Past Medical History:  Diagnosis Date   Allergic rhinitis    Anemia    Constipation    Hypertension     Past Surgical History:  Procedure Laterality Date   CESAREAN SECTION     x2    There were no vitals filed for this visit.   Subjective Assessment - 10/12/20 1304     Subjective  S: It's hurting with what I had to do at work.    Currently in Pain? Yes    Pain Score 8     Pain Location Shoulder    Pain Orientation Right    Pain Descriptors / Indicators Aching;Sore;Sharp;Shooting    Pain Type Acute pain    Pain Radiating Towards N/A    Pain Onset In the past 7 days    Pain Frequency Constant    Aggravating Factors  work, use, lifting, reaching    Pain Relieving Factors pain medication, heat, ice    Effect of Pain on Daily Activities max effect on RUE use during ADLs    Multiple Pain Sites No                          OT Treatments/Exercises (OP) - 10/12/20 1306       Exercises   Exercises Shoulder      Shoulder Exercises: Supine   Protraction Limitations unable to complete      Shoulder Exercises: ROM/Strengthening   Pendulum pendulum hang 1'       Modalities   Modalities Electrical Stimulation;Moist Heat      Moist Heat Therapy   Number Minutes Moist Heat 15 Minutes    Moist Heat Location Shoulder      Electrical Stimulation   Electrical Stimulation Location right shoulder    Electrical Stimulation Action interferential    Electrical Stimulation Parameters 6.2CV    Electrical Stimulation Goals Pain      Manual Therapy   Manual Therapy Myofascial release    Manual therapy comments completed separately from therapeutic exercises    Myofascial Release myofascial release and manual techniques to right upper arm, anterior shoulder, trapezius and scapular regions to decrease pain and fascial restrictions and increase joint ROM                      OT Short Term Goals - 09/30/20 1331       OT SHORT TERM GOAL #1   Title Pt will be provided with HEP to improve mobility of RUE  required for ADL completion.    Time 4    Period Weeks    Status On-going    Target Date 10/23/20      OT SHORT TERM GOAL #2   Title Pt will decrease pain in RUE to 4/10 or less to improve ability to sleep for 3+ hours without waking due to pain.    Time 4    Period Weeks    Status On-going      OT SHORT TERM GOAL #3   Title Pt will increase RUE A/ROM to [facility] to improve ability to reach for items in overhead cabinets.    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    Time 4    Period Weeks    Status On-going      OT SHORT TERM GOAL #5   Title Pt will decrease RUE fascial restrictions to minimal amounts or less to improve ability to perform functional reaching tasks during ADLs and during work tasks.    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 Continued with myofascial release to address fascial restrictions in upper arm, trapezius, and scapular regions. Attempted P/ROM however pt not able to tolerate due to high pain level. Completed ES and MH for pain management, pt reporting pain of 6/10 after ES. Discussed RUE presentation with pt and recommend discussing having an MRI completed to rule out RC tear.    Body Structure / Function / Physical Skills ADL;Endurance;UE functional use;Muscle spasms;Fascial restriction;Pain;ROM;IADL;Strength    Plan P: Attempt flexion AA/ROM in supine, wall wash    OT Home Exercise Plan eval: table slides    Consulted and Agree with Plan of Care Patient             Patient will benefit from skilled therapeutic intervention in order to improve the following deficits and impairments:   Body Structure / Function / Physical Skills: ADL, Endurance, UE functional use, Muscle spasms, Fascial restriction, Pain, ROM, IADL, Strength       Visit Diagnosis: Other symptoms and signs involving the musculoskeletal system  Acute pain of right shoulder    Problem List Patient Active Problem List   Diagnosis Date Noted   Right shoulder pain 08/06/2020   Great toe pain 03/01/2020   GERD (gastroesophageal reflux disease) 06/18/2018   Colon cancer screening 09/01/2013   Metabolic syndrome X 10/20/2012   Vitamin D deficiency 10/16/2012   Cervical neck pain with evidence of disc disease 11/06/2011   Hyperlipidemia 08/26/2010   Obesity (BMI 30.0-34.9) 01/16/2010   Seasonal allergies 06/07/2007   Essential hypertension 03/06/2006    Ezra Sites, OTR/L  [phone] 10/12/2020, 2:10 PM  [facility] [address] [place], [place], [address] Phone: [phone]   Fax:  [phone]  Name: Laurie Olson MRN: [id number] Date of Birth: 03-16-1960

## 2020-10-14 ENCOUNTER — Encounter (HOSPITAL_COMMUNITY): Payer: Self-pay | Admitting: Occupational Therapy

## 2020-10-14 ENCOUNTER — Encounter: Payer: Self-pay | Admitting: Orthopaedic Surgery

## 2020-10-14 ENCOUNTER — Ambulatory Visit (HOSPITAL_COMMUNITY): Payer: BC Managed Care – PPO | Admitting: Occupational Therapy

## 2020-10-14 ENCOUNTER — Other Ambulatory Visit: Payer: Self-pay

## 2020-10-14 DIAGNOSIS — M25511 Pain in right shoulder: Secondary | ICD-10-CM | POA: Diagnosis not present

## 2020-10-14 DIAGNOSIS — R29898 Other symptoms and signs involving the musculoskeletal system: Secondary | ICD-10-CM

## 2020-10-14 NOTE — Therapy (Addendum)
[facility] [address] [place], [place], [address] Phone: [phone]   Fax:  [phone]  Occupational Therapy Treatment  Patient Details  Name: Laurie Olson MRN: [id number] Date of Birth: 12-25-1960 Referring Provider (OT): Dr. Darreld Mclean   Encounter Date: 10/14/2020   OT End of Session - 10/14/20 1632     Visit Number 6    Number of Visits 8    Date for OT Re-Evaluation 10/23/20    Authorization Type BCBS    Authorization Time Period 30 visit limit    Authorization - Visit Number 6    Authorization - Number of Visits 30    OT Start Time 1434    OT Stop Time 1513    OT Time Calculation (min) 39 min    Activity Tolerance Patient tolerated treatment well    Behavior During Therapy [facility] for tasks assessed/performed             Past Medical History:  Diagnosis Date   Allergic rhinitis    Anemia    Constipation    Hypertension     Past Surgical History:  Procedure Laterality Date   CESAREAN SECTION     x2    There were no vitals filed for this visit.   Subjective Assessment - 10/14/20 1436     Subjective  S: I've been working so it's hurting.    Currently in Pain? Yes    Pain Score 7     Pain Location Shoulder    Pain Orientation Right    Pain Descriptors / Indicators Aching;Sore;Shooting;Sharp    Pain Type Acute pain    Pain Radiating Towards N/A    Pain Onset 1 to 4 weeks ago    Pain Frequency Constant    Aggravating Factors  work, use, lifting    Pain Relieving Factors pain medication, heat, ice    Effect of Pain on Daily Activities max effect on RUE use during ADLs and work    Multiple Pain Sites No                [facility] OT Assessment - 10/14/20 1435       Assessment   Medical Diagnosis Right shoulder pain      Precautions   Precautions None                      OT Treatments/Exercises (OP) - 10/14/20 1436       Exercises   Exercises Shoulder      Shoulder  Exercises: Supine   External Rotation AAROM;10 reps    Internal Rotation AAROM;10 reps      Shoulder Exercises: Therapy Ball   Flexion 10 reps    ABduction 10 reps      Modalities   Modalities Electrical Stimulation;Moist Heat      Moist Heat Therapy   Number Minutes Moist Heat 15 Minutes    Moist Heat Location Shoulder      Electrical Stimulation   Electrical Stimulation Location right shoulder    Electrical Stimulation Action interferential    Electrical Stimulation Parameters 5.8CV    Electrical Stimulation Goals Pain      Manual Therapy   Manual Therapy Myofascial release    Manual therapy comments completed separately from therapeutic exercises    Myofascial Release myofascial release and manual techniques to right upper arm, anterior shoulder, trapezius and scapular regions to decrease pain and fascial restrictions and increase joint ROM  OT Short Term Goals - 09/30/20 1331       OT SHORT TERM GOAL #1   Title Pt will be provided with HEP to improve mobility of RUE required for ADL completion.    Time 4    Period Weeks    Status On-going    Target Date 10/23/20      OT SHORT TERM GOAL #2   Title Pt will decrease pain in RUE to 4/10 or less to improve ability to sleep for 3+ hours without waking due to pain.    Time 4    Period Weeks    Status On-going      OT SHORT TERM GOAL #3   Title Pt will increase RUE A/ROM to [facility] to improve ability to reach for items in overhead cabinets.    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    Time 4    Period Weeks    Status On-going      OT SHORT TERM GOAL #5   Title Pt will decrease RUE fascial restrictions to minimal amounts or less to improve ability to perform functional reaching tasks during ADLs and during work tasks.    Time 4     Period Weeks    Status On-going                      Plan - 10/14/20 1655     Clinical Impression Statement A: Pt reports continued high pain after working, has been using heat and high doses of OTC pain medicine for pain control. Myofascial release completed at beginning of session, attempted AA/ROM however unable to complete due to increasing pain. Pt completing therapy ball stretches, then ES was completed for pain control. After ES pain down to 4/10 compared to 7/10 on arrival. Provided information for purchase of home TENS unit if pt is interested.    Body Structure / Function / Physical Skills ADL;Endurance;UE functional use;Muscle spasms;Fascial restriction;Pain;ROM;IADL;Strength    Plan P: continue with myofascial release, attempt AA/ROM or isometrics    OT Home Exercise Plan eval: table slides    Consulted and Agree with Plan of Care Patient             Patient will benefit from skilled therapeutic intervention in order to improve the following deficits and impairments:   Body Structure / Function / Physical Skills: ADL, Endurance, UE functional use, Muscle spasms, Fascial restriction, Pain, ROM, IADL, Strength       Visit Diagnosis: Other symptoms and signs involving the musculoskeletal system  Acute pain of right shoulder    Problem List Patient Active Problem List   Diagnosis Date Noted   Right shoulder pain 08/06/2020   Great toe pain 03/01/2020   GERD (gastroesophageal reflux disease) 06/18/2018   Colon cancer screening 09/01/2013   Metabolic syndrome X 10/20/2012   Vitamin D deficiency 10/16/2012   Cervical neck pain with evidence of disc disease 11/06/2011   Hyperlipidemia 08/26/2010   Obesity (BMI 30.0-34.9) 01/16/2010   Seasonal allergies 06/07/2007   Essential hypertension 03/06/2006    Ezra Sites, OTR/L  [phone] 10/14/2020, 4:59 PM  [facility] [address] [place], [place],  [address] Phone: [phone]   Fax:  [phone]  Name:  Laurie Olson MRN: [id number] Date of Birth: March 05, 1960

## 2020-10-18 ENCOUNTER — Other Ambulatory Visit: Payer: Self-pay | Admitting: Family Medicine

## 2020-10-19 ENCOUNTER — Other Ambulatory Visit: Payer: Self-pay

## 2020-10-19 ENCOUNTER — Other Ambulatory Visit: Payer: Self-pay | Admitting: Family Medicine

## 2020-10-19 ENCOUNTER — Encounter (HOSPITAL_COMMUNITY): Payer: Self-pay

## 2020-10-19 ENCOUNTER — Ambulatory Visit (HOSPITAL_COMMUNITY): Payer: BC Managed Care – PPO

## 2020-10-19 DIAGNOSIS — M25511 Pain in right shoulder: Secondary | ICD-10-CM

## 2020-10-19 DIAGNOSIS — R29898 Other symptoms and signs involving the musculoskeletal system: Secondary | ICD-10-CM

## 2020-10-19 NOTE — Patient Instructions (Addendum)
Perform each exercise ____10-12____ reps. 1-2x days.  *COMPLETE THE FOLLOWING EXERCISES LAYING DOWN TO ACHIEVE THE MOST RANGE OF MOTION.*  1) Protraction   Start by holding a wand or cane at chest height.  Next, slowly push the wand outwards in front of your body so that your elbows become fully straightened. Then, return to the original position.     2) Shoulder FLEXION   In the standing position, hold a wand/cane with both arms, palms down on both sides. Raise up the wand/cane allowing your unaffected arm to perform most of the effort. Your affected arm should be partially relaxed.      3) Internal/External ROTATION   In the standing position, hold a wand/cane with both hands keeping your elbows bent. Move your arms and wand/cane to one side.  Your affected arm should be partially relaxed while your unaffected arm performs most of the effort.       4) Shoulder ABDUCTION *COMPLETE THIS EXERCISE SITTING/STANDING  While holding a wand/cane palm face up on the injured side and palm face down on the uninjured side, slowly raise up your injured arm to the side.        5) Horizontal Abduction/Adduction      Straight arms holding cane at shoulder height, bring cane to right, center, left. Repeat starting to left.   Copyright  VHI. All rights reserved.

## 2020-10-20 NOTE — Therapy (Signed)
[facility] [address] [place], [place], [address] Phone: [phone]   Fax:  [phone]  Occupational Therapy Treatment  Patient Details  Name: Laurie Olson MRN: [id number] Date of Birth: 1960-06-19 Referring Provider (OT): Dr. Darreld Mclean   Encounter Date: 10/19/2020   OT End of Session - 10/20/20 0847     Visit Number 7    Number of Visits 8    Date for OT Re-Evaluation 10/23/20    Authorization Type BCBS    Authorization Time Period 30 visit limit    Authorization - Visit Number 7    Authorization - Number of Visits 30    OT Start Time 1433    OT Stop Time 1515    OT Time Calculation (min) 42 min    Activity Tolerance Patient tolerated treatment well    Behavior During Therapy [facility] for tasks assessed/performed             Past Medical History:  Diagnosis Date   Allergic rhinitis    Anemia    Constipation    Hypertension     Past Surgical History:  Procedure Laterality Date   CESAREAN SECTION     x2    There were no vitals filed for this visit.   Subjective Assessment - 10/19/20 1436     Subjective  S: I've been lifting some today so it's bothering me a little.    Currently in Pain? Yes    Pain Score 5     Pain Location Shoulder    Pain Orientation Right    Pain Descriptors / Indicators Aching;Sore;Constant    Pain Type Acute pain    Pain Onset 1 to 4 weeks ago    Pain Frequency Constant    Aggravating Factors  lifting at work    Pain Relieving Factors pain medication, heat, ice    Effect of Pain on Daily Activities max effect    Multiple Pain Sites No                [facility] OT Assessment - 10/20/20 0858       Assessment   Medical Diagnosis Right shoulder pain      Precautions   Precautions None                      OT Treatments/Exercises (OP) - 10/19/20 1451       Exercises   Exercises Shoulder      Shoulder Exercises: Supine   Protraction AAROM;10 reps     Protraction Limitations Initially was unable to complete. Therapist completed one repetition with patient while providing stability to allow patient to see that she was able to complete successfully.    External Rotation AAROM;10 reps    Internal Rotation AAROM;10 reps    Flexion AAROM;10 reps      Shoulder Exercises: Standing   ABduction AAROM;5 reps      Manual Therapy   Manual Therapy Myofascial release    Manual therapy comments completed separately from therapeutic exercises    Myofascial Release myofascial release and manual techniques to right upper arm, anterior shoulder, trapezius and scapular regions to decrease pain and fascial restrictions and increase joint ROM                    OT Education - 10/20/20 0847     Education Details AA/ROM supine shoulder except abduction which she is to complete standing.    Methods Explanation;Demonstration;Verbal  cues;Handout    Comprehension Verbalized understanding;Returned demonstration              OT Short Term Goals - 09/30/20 1331       OT SHORT TERM GOAL #1   Title Pt will be provided with HEP to improve mobility of RUE required for ADL completion.    Time 4    Period Weeks    Status On-going    Target Date 10/23/20      OT SHORT TERM GOAL #2   Title Pt will decrease pain in RUE to 4/10 or less to improve ability to sleep for 3+ hours without waking due to pain.    Time 4    Period Weeks    Status On-going      OT SHORT TERM GOAL #3   Title Pt will increase RUE A/ROM to [facility] to improve ability to reach for items in overhead cabinets.    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    Time 4    Period Weeks    Status On-going      OT SHORT TERM GOAL #5   Title Pt will decrease RUE fascial restrictions to minimal amounts or less to improve ability to  perform functional reaching tasks during ADLs and during work tasks.    Time 4    Period Weeks    Status On-going                      Plan - 10/20/20 0848     Clinical Impression Statement A: COmplete myofascial release to address moderate fascial restrictions located in right upper arm and upper trapezius region. Initially when attempting to complete AA/ROM protraction, patient demonstrated that she was unable to due possible pain. Therapist completed one repetitions with her providing hands on active assist with patient only requiring very minimal assist. Educated patient that she was able to complete movement and when attempting it a second time with PVC pipe she was successful. Education provided on how the lack of movement and increase protectining and guarding and made her think that any movement will be bad and painful. The more that she can move it the less it will hurt and it will gradually get better. Pt was able to complete all AA/ROM exercises. Increased rest breaks were taken when completing horizontal abduction. AA/ROM Abduction was completed standing to help with form and comfort level. HEP was updated. pt was encouraged to use ice upon returning home for 5-10 minutes to help decrease the pain/soreness she will feel later from the session.    Body Structure / Function / Physical Skills ADL;Endurance;UE functional use;Muscle spasms;Fascial restriction;Pain;ROM;IADL;Strength    Plan P: Reassessment. Re-cert if patient would like to continue. Or wait until follow up appointment with Dr. Hilda Lias to discuss possible imaging before continuing. Follow up on HEP.    OT Home Exercise Plan eval: table slides 8/23: AA/ROM    Consulted and Agree with Plan of Care Patient             Patient will benefit from skilled therapeutic intervention in order to improve the following deficits and impairments:   Body Structure / Function / Physical Skills: ADL, Endurance, UE functional  use, Muscle spasms, Fascial restriction, Pain,  ROM, IADL, Strength       Visit Diagnosis: Other symptoms and signs involving the musculoskeletal system  Acute pain of right shoulder    Problem List Patient Active Problem List   Diagnosis Date Noted   Right shoulder pain 08/06/2020   Great toe pain 03/01/2020   GERD (gastroesophageal reflux disease) 06/18/2018   Colon cancer screening 09/01/2013   Metabolic syndrome X 10/20/2012   Vitamin D deficiency 10/16/2012   Cervical neck pain with evidence of disc disease 11/06/2011   Hyperlipidemia 08/26/2010   Obesity (BMI 30.0-34.9) 01/16/2010   Seasonal allergies 06/07/2007   Essential hypertension 03/06/2006    Limmie Patricia, OTR/L,CBIS  [phone]  10/20/2020, 8:58 AM  [facility] [address] [place], [place], [address] Phone: [phone]   Fax:  [phone]  Name: Laurie Olson MRN: [id number] Date of Birth: 1960-03-24

## 2020-10-21 ENCOUNTER — Other Ambulatory Visit: Payer: Self-pay

## 2020-10-21 ENCOUNTER — Encounter (HOSPITAL_COMMUNITY): Payer: Self-pay | Admitting: Occupational Therapy

## 2020-10-21 ENCOUNTER — Ambulatory Visit (HOSPITAL_COMMUNITY): Payer: BC Managed Care – PPO | Admitting: Occupational Therapy

## 2020-10-21 DIAGNOSIS — M25511 Pain in right shoulder: Secondary | ICD-10-CM | POA: Diagnosis not present

## 2020-10-21 DIAGNOSIS — R29898 Other symptoms and signs involving the musculoskeletal system: Secondary | ICD-10-CM

## 2020-10-21 NOTE — Therapy (Signed)
[place], [place], [address] Phone: [phone]   Fax:  [phone]  Occupational Therapy Treatment  Patient Details  Name: Laurie Olson MRN: [id number] Date of Birth: 1960-06-22 Referring Provider (OT): Dr. Sanjuana Kava   Encounter Date: 10/21/2020   OT End of Session - 10/21/20 1448     Visit Number 8    Number of Visits 8    Date for OT Re-Evaluation 10/23/20    Authorization Type BCBS    Authorization Time Period 30 visit limit    Authorization - Visit Number 8    Authorization - Number of Visits 30    OT Start Time 4801    OT Stop Time 1501    OT Time Calculation (min) 30 min    Activity Tolerance Patient tolerated treatment well    Behavior During Therapy [facility] for tasks assessed/performed             Past Medical History:  Diagnosis Date   Allergic rhinitis    Anemia    Constipation    Hypertension     Past Surgical History:  Procedure Laterality Date   CESAREAN SECTION     x2    There were no vitals filed for this visit.   Subjective Assessment - 10/21/20 1431     Subjective  S: It's a dull aching pain.    Currently in Pain? Yes    Pain Score 6     Pain Location Shoulder    Pain Orientation Right    Pain Descriptors / Indicators Aching;Sore;Dull    Pain Type Acute pain    Pain Radiating Towards N/A    Pain Onset More than a month ago    Pain Frequency Constant    Aggravating Factors  lifting at work    Pain Relieving Factors pain medication, heat, ice    Effect of Pain on Daily Activities max effect on ADLs    Multiple Pain Sites No                [facility] OT Assessment - 10/21/20 1431       Assessment   Medical Diagnosis Right shoulder pain      Precautions   Precautions None      Observation/Other Assessments   Focus on Therapeutic Outcomes (FOTO)  42/100   49/100 previous     Palpation   Palpation comment mod fascial restrictions in right upper arm,  trapezius, and scapular regions      AROM   Overall AROM Comments Assessed seated, er/IR adducted    AROM Assessment Site Shoulder    Right/Left Shoulder Right    Right Shoulder Flexion 70 Degrees   61 previous   Right Shoulder ABduction 90 Degrees   81 previous   Right Shoulder Internal Rotation 90 Degrees   same as previous   Right Shoulder External Rotation 40 Degrees   32 previous     PROM   Overall PROM Comments Assessed supine, er/IR adducted    PROM Assessment Site Shoulder    Right/Left Shoulder Right    Right Shoulder Flexion 105 Degrees   85 previous   Right Shoulder ABduction 124 Degrees   65 previous   Right Shoulder Internal Rotation 90 Degrees   same as previous   Right Shoulder External Rotation 60 Degrees   48 previous     Strength   Overall Strength Comments Assessed seated, er/IR adducted    Strength Assessment Site Shoulder  Right/Left Shoulder Right    Right Shoulder Flexion 3-/5   2+/5 previous   Right Shoulder ABduction 3-/5   same as previous   Right Shoulder Internal Rotation 3/5   same as previous   Right Shoulder External Rotation 3-/5   same as previous                     OT Treatments/Exercises (OP) - 10/21/20 1434       Exercises   Exercises Shoulder      Shoulder Exercises: Seated   Row AROM;10 reps    Protraction AAROM;10 reps    External Rotation Limitations unable to complete due to pain      Manual Therapy   Manual Therapy Myofascial release    Manual therapy comments completed separately from therapeutic exercises    Myofascial Release myofascial release and manual techniques to right upper arm, anterior shoulder, trapezius and scapular regions to decrease pain and fascial restrictions and increase joint ROM                      OT Short Term Goals - 10/21/20 1447       OT SHORT TERM GOAL #1   Title Pt will be provided with HEP to improve mobility of RUE required for ADL completion.    Time 4     Period Weeks    Status Achieved    Target Date 10/23/20      OT SHORT TERM GOAL #2   Title Pt will decrease pain in RUE to 4/10 or less to improve ability to sleep for 3+ hours without waking due to pain.    Time 4    Period Weeks    Status Not Met      OT SHORT TERM GOAL #3   Title Pt will increase RUE A/ROM to [facility] to improve ability to reach for items in overhead cabinets.    Time 4    Period Weeks    Status Not Met      OT SHORT TERM GOAL #4   Title Pt will increase RUE strength to 4/5 or greater to improve ability to complete lifting tasks required by her job.    Time 4    Period Weeks    Status Not Met      OT SHORT TERM GOAL #5   Title Pt will decrease RUE fascial restrictions to minimal amounts or less to improve ability to perform functional reaching tasks during ADLs and during work tasks.    Time 4    Period Weeks    Status Not Met                      Plan - 10/21/20 1457     Clinical Impression Statement A: Reassessment completed this session, pt has met 1 goal for HEP. Pt continues to be significantly limited in exercise completion due to pain that begins in the shoulder and shoots down to the arm and hand the more she moves her RUE. 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 Pt is using compensatory strategies and movements with all tasks-including dressing, reaching to put deodorant on, moving items, etc. Pt with poor activity tolerance today, attempted AA/ROM however was unable to complete due to shooting pains. Discussed progress with pt and recommend discussing further imaging such as MRI with  MD to check for RC tear. Pt agreeable to be placed on hold until after MD appt.    Body Structure / Function / Physical Skills ADL;Endurance;UE functional use;Muscle spasms;Fascial restriction;Pain;ROM;IADL;Strength    Plan P: Hold therapy until after pt sees MD to discuss further  imaging.    OT Home Exercise Plan eval: table slides 8/23: AA/ROM    Consulted and Agree with Plan of Care Patient             Patient will benefit from skilled therapeutic intervention in order to improve the following deficits and impairments:   Body Structure / Function / Physical Skills: ADL, Endurance, UE functional use, Muscle spasms, Fascial restriction, Pain, ROM, IADL, Strength       Visit Diagnosis: Other symptoms and signs involving the musculoskeletal system  Acute pain of right shoulder    Problem List Patient Active Problem List   Diagnosis Date Noted   Right shoulder pain 08/06/2020   Great toe pain 03/01/2020   GERD (gastroesophageal reflux disease) 06/18/2018   Colon cancer screening 46/95/0722   Metabolic syndrome X 57/50/5183   Vitamin D deficiency 10/16/2012   Cervical neck pain with evidence of disc disease 11/06/2011   Hyperlipidemia 08/26/2010   Obesity (BMI 30.0-34.9) 01/16/2010   Seasonal allergies 06/07/2007   Essential hypertension 03/06/2006    Guadelupe Sabin, OTR/L  [phone] 10/21/2020, 3:03 PM  [place] [address] [place], [place], [address] Phone: [phone]   Fax:  [phone]  Name: Laurie Olson MRN: [id number] Date of Birth: 1960-07-18

## 2020-10-24 ENCOUNTER — Other Ambulatory Visit: Payer: Self-pay | Admitting: Nurse Practitioner

## 2020-10-26 ENCOUNTER — Ambulatory Visit: Payer: BC Managed Care – PPO | Admitting: Orthopaedic Surgery

## 2020-10-26 ENCOUNTER — Encounter: Payer: Self-pay | Admitting: Orthopaedic Surgery

## 2020-10-26 ENCOUNTER — Other Ambulatory Visit: Payer: Self-pay

## 2020-10-26 ENCOUNTER — Encounter (HOSPITAL_COMMUNITY): Payer: BC Managed Care – PPO | Admitting: Occupational Therapy

## 2020-10-26 VITALS — BP 129/92 | HR 94 | Ht 66.0 in | Wt 203.8 lb

## 2020-10-26 DIAGNOSIS — M542 Cervicalgia: Secondary | ICD-10-CM | POA: Diagnosis not present

## 2020-10-26 MED ORDER — HYDROCODONE-ACETAMINOPHEN 5-325 MG PO TABS: ORAL_TABLET | ORAL | 0 refills | Status: DC

## 2020-10-26 NOTE — Progress Notes (Signed)
My shoulder still hurts.  She has been to PT/OT and is not making good progress. She has developed pain radiating to the thumb and numbness to the thumb and index finger on the right.    I have read the OT notes.  I will stop OT.  I will get MRI of the cervical spine.  ROM of the shoulder on the right is painful.  She has decreased sensation of the area of the right thumb dorsally and of the triceps.  She has some weakness of triceps 4 of 5 on the right.  Neck ROM is full.  Encounter Diagnosis  Name Primary?   Neck pain on right side Yes   I will get MRI of the cervical spine. Stop OT.  I will call in pain medicine.  Return in two weeks.  Call if any problem.  Precautions discussed.  Electronically Signed Darreld Mclean, MD 8/30/20222:53 PM

## 2020-10-28 ENCOUNTER — Encounter (HOSPITAL_COMMUNITY): Payer: BC Managed Care – PPO | Admitting: Occupational Therapy

## 2020-11-06 ENCOUNTER — Other Ambulatory Visit: Payer: Self-pay

## 2020-11-06 ENCOUNTER — Ambulatory Visit
Admission: RE | Admit: 2020-11-06 | Discharge: 2020-11-06 | Disposition: A | Discharge status: Home / Self Care | Payer: BC Managed Care – PPO | Source: Ambulatory Visit | Attending: Orthopaedic Surgery | Admitting: Orthopaedic Surgery

## 2020-11-06 DIAGNOSIS — M542 Cervicalgia: Secondary | ICD-10-CM

## 2020-11-09 ENCOUNTER — Other Ambulatory Visit: Payer: Self-pay

## 2020-11-09 ENCOUNTER — Ambulatory Visit: Payer: BC Managed Care – PPO | Admitting: Orthopaedic Surgery

## 2020-11-09 ENCOUNTER — Encounter: Payer: Self-pay | Admitting: Orthopaedic Surgery

## 2020-11-09 VITALS — BP 146/103 | HR 70 | Ht 66.0 in | Wt 204.4 lb

## 2020-11-09 DIAGNOSIS — M542 Cervicalgia: Secondary | ICD-10-CM | POA: Diagnosis not present

## 2020-11-09 DIAGNOSIS — G8929 Other chronic pain: Secondary | ICD-10-CM | POA: Diagnosis not present

## 2020-11-09 DIAGNOSIS — M25511 Pain in right shoulder: Secondary | ICD-10-CM | POA: Diagnosis not present

## 2020-11-09 NOTE — Progress Notes (Signed)
My shoulder is hurting.  She had the MRI of the cervical spine showing: Mild for age cervical spine degeneration and capacious spinal canal. No spinal stenosis or convincing right side neural impingement.    I have explained the findings to her.    I have independently reviewed the MRI.    She has more shoulder pain today.  She has been doing the exercises she learned at OT.  ROM of the right shoulder is nearly full with pain in the extremes.  NV intact today. ROM of neck is full.  Encounter Diagnoses  Name Primary?   Chronic right shoulder pain Yes   Neck pain on right side    PROCEDURE NOTE:  The patient request injection, verbal consent was obtained.  The right shoulder was prepped appropriately after time out was performed.   Sterile technique was observed and injection of 1 cc of Celestone 6 mg with several cc's of plain xylocaine. Anesthesia was provided by ethyl chloride and a 20-gauge needle was used to inject the shoulder area. A posterior approach was used.  The injection was tolerated well.  A band aid dressing was applied.  The patient was advised to apply ice later today and tomorrow to the injection sight as needed.   Return in one month.  Do exercises at home.  Call if any problem.  Precautions discussed.  Electronically Signed Darreld Mclean, MD 9/13/20222:19 PM

## 2020-11-15 ENCOUNTER — Telehealth: Payer: Self-pay | Admitting: Radiology

## 2020-11-15 MED ORDER — HYDROCODONE-ACETAMINOPHEN 5-325 MG PO TABS: ORAL_TABLET | ORAL | 0 refills | Status: DC

## 2020-11-15 NOTE — Telephone Encounter (Signed)
Patient called, said she does think she needs a pain medication refill, she declined at last visit.  Asks it to be sent in at this time though.

## 2020-11-23 ENCOUNTER — Encounter (HOSPITAL_COMMUNITY): Payer: Self-pay | Admitting: Occupational Therapy

## 2020-11-23 ENCOUNTER — Other Ambulatory Visit: Payer: Self-pay | Admitting: Family Medicine

## 2020-11-23 NOTE — Therapy (Signed)
[place], [place], [address] Phone: [phone]   Fax:  [phone]  Patient Details  Name: Laurie Olson MRN: [id number] Date of Birth: 11-28-1960 Referring Provider:  No ref. provider found  Encounter Date: 11/23/2020  OCCUPATIONAL THERAPY DISCHARGE SUMMARY  Visits from Start of Care: 8  Current functional level related to goals / functional outcomes: Unknown. Pt was placed on hold on 10/21/20 to pursue imaging. MRI of cervical spine was completed, no MRI of right shoulder. Per MD note pt was instructed to stop therapy, has not returned since. Current functioning is unknown.    Remaining deficits: Continued pain and decreased functional use of RUE   Education / Equipment: HEP   Patient agrees to discharge. Patient goals were not met. Patient is being discharged due to the physician's request..      Guadelupe Sabin, OTR/L  [phone] 11/23/2020, 10:40 AM  [place], [place], [address] Phone: [phone]   Fax:  [phone]

## 2020-11-30 ENCOUNTER — Other Ambulatory Visit: Payer: Self-pay | Admitting: Family Medicine

## 2020-12-06 ENCOUNTER — Other Ambulatory Visit: Payer: Self-pay | Admitting: Nurse Practitioner

## 2020-12-07 ENCOUNTER — Other Ambulatory Visit: Payer: Self-pay

## 2020-12-07 ENCOUNTER — Encounter: Payer: Self-pay | Admitting: Orthopaedic Surgery

## 2020-12-07 ENCOUNTER — Ambulatory Visit: Payer: BC Managed Care – PPO | Admitting: Orthopaedic Surgery

## 2020-12-07 VITALS — BP 137/85 | HR 82 | Ht 66.0 in | Wt 202.0 lb

## 2020-12-07 DIAGNOSIS — M25511 Pain in right shoulder: Secondary | ICD-10-CM

## 2020-12-07 DIAGNOSIS — G8929 Other chronic pain: Secondary | ICD-10-CM | POA: Diagnosis not present

## 2020-12-07 MED ORDER — HYDROCODONE-ACETAMINOPHEN 5-325 MG PO TABS: ORAL_TABLET | ORAL | 0 refills | Status: DC

## 2020-12-07 NOTE — Progress Notes (Signed)
PROCEDURE NOTE:  The patient request injection, verbal consent was obtained.  The right shoulder was prepped appropriately after time out was performed.   Sterile technique was observed and injection of 1 cc of Celestone 6 mg with several cc's of plain xylocaine. Anesthesia was provided by ethyl chloride and a 20-gauge needle was used to inject the shoulder area. A posterior approach was used.  The injection was tolerated well.  A band aid dressing was applied.  The patient was advised to apply ice later today and tomorrow to the injection sight as needed.   Encounter Diagnosis  Name Primary?   Chronic right shoulder pain Yes   I have refilled her pain medicine.  Return in one month.  Call if any problem.  Precautions discussed.  Electronically Signed Darreld Mclean, MD 10/11/20222:06 PM

## 2020-12-27 ENCOUNTER — Other Ambulatory Visit: Payer: Self-pay | Admitting: Family Medicine

## 2021-01-04 ENCOUNTER — Ambulatory Visit: Payer: BC Managed Care – PPO | Admitting: Orthopaedic Surgery

## 2021-01-10 ENCOUNTER — Other Ambulatory Visit: Payer: Self-pay | Admitting: Nurse Practitioner

## 2021-02-07 ENCOUNTER — Telehealth: Payer: Self-pay

## 2021-02-07 ENCOUNTER — Encounter: Payer: Self-pay | Admitting: Nurse Practitioner

## 2021-02-07 ENCOUNTER — Other Ambulatory Visit: Payer: Self-pay

## 2021-02-07 ENCOUNTER — Ambulatory Visit (INDEPENDENT_AMBULATORY_CARE_PROVIDER_SITE_OTHER): Payer: BC Managed Care – PPO | Admitting: Nurse Practitioner

## 2021-02-07 ENCOUNTER — Other Ambulatory Visit: Payer: Self-pay | Admitting: Family Medicine

## 2021-02-07 DIAGNOSIS — R112 Nausea with vomiting, unspecified: Secondary | ICD-10-CM

## 2021-02-07 MED ORDER — ONDANSETRON 4 MG PO TBDP: 4.0000 mg | ORAL_TABLET | Freq: Three times a day (TID) | ORAL | 0 refills | Status: DC | PRN

## 2021-02-07 NOTE — Progress Notes (Signed)
Acute Office Visit  Subjective:    Patient ID: Laurie Olson, female    DOB: 1960/04/08, 60 y.o.   MRN: [id number]  Chief Complaint  Patient presents with   Headache    Nausea vomiting headache since Saturday,feverish with chills.    HPI Patient is in today for sick visit. Symptoms started 3 days ago. She has tried taking tylenol and zofran (that she got from a relative). She is having abdominal pain.  She states that she went to a birthday party on Saturday and ate banquet food, and started feeling bad after that.  Denies recent abx use.  Past Medical History:  Diagnosis Date   Allergic rhinitis    Anemia    Constipation    Hypertension     Past Surgical History:  Procedure Laterality Date   CESAREAN SECTION     x2    Family History  Problem Relation Age of Onset   Hypertension Mother 39   Hypertension Sister 48   Allergies Son    Eczema Son    Asthma Son     Social History   Socioeconomic History   Marital status: Divorced    Spouse name: Not on file   Number of children: Not on file   Years of education: Not on file   Highest education level: Not on file  Occupational History   Not on file  Tobacco Use   Smoking status: Former    Packs/day: 2.00    Types: Cigarettes   Smokeless tobacco: Never  Substance and Sexual Activity   Alcohol use: No   Drug use: No   Sexual activity: Not on file  Other Topics Concern   Not on file  Social History Narrative   Not on file   Social Determinants of Health   Financial Resource Strain: Not on file  Food Insecurity: Not on file  Transportation Needs: Not on file  Physical Activity: Not on file  Stress: Not on file  Social Connections: Not on file  Intimate Partner Violence: Not on file    Outpatient Medications Prior to Visit  Medication Sig Dispense Refill   amLODipine (NORVASC) 10 MG tablet Take 1 tablet by mouth once daily 30 tablet 2   betamethasone dipropionate 0.05 % cream Apply sparingly  twice daily to hands for 1 week, then as needed 45 g 0   clotrimazole-betamethasone (LOTRISONE) cream APPLY TOPICALLY TWICE DAILY FOR 10 DAYS TO RASH, THEN AS NEEDED 45 g 0   cyclobenzaprine (FLEXERIL) 10 MG tablet Take one tablet at bedtime , as needed, for neck spasm 30 tablet 1   fluticasone (FLONASE) 50 MCG/ACT nasal spray Use 1 spray(s) in each nostril once daily 16 g 5   HYDROcodone-acetaminophen (NORCO/VICODIN) 5-325 MG tablet One tablet every six hours for pain. 20 tablet 0   ibuprofen (ADVIL) 800 MG tablet Take 1 tablet (800 mg total) by mouth every 8 (eight) hours as needed. 30 tablet 0   metoprolol succinate (TOPROL-XL) 25 MG 24 hr tablet Take 1 tablet by mouth once daily 30 tablet 0   montelukast (SINGULAIR) 10 MG tablet Take 1 tablet (10 mg total) by mouth at bedtime. 30 tablet 5   pantoprazole (PROTONIX) 20 MG tablet Take 1 tablet (20 mg total) by mouth daily. 30 tablet 5   rosuvastatin (CRESTOR) 20 MG tablet Take 1 tablet by mouth once daily 30 tablet 0   Vitamin D, Ergocalciferol, (DRISDOL) 1.25 MG (50000 UNIT) CAPS capsule Take 1 capsule by mouth  once a week 12 capsule 0   No facility-administered medications prior to visit.    Allergies  Allergen Reactions   Hydrocodone-Acetaminophen     REACTION: nausea, vomiting    Review of Systems     Objective:    Physical Exam  LMP 07/09/2010  Wt Readings from Last 3 Encounters:  12/07/20 202 lb (91.6 kg)  11/09/20 204 lb 6.4 oz (92.7 kg)  10/26/20 203 lb 12.8 oz (92.4 kg)    Health Maintenance Due  Topic Date Due   TETANUS/TDAP  11/30/2019   COVID-19 Vaccine (4 - Booster for Moderna series) 04/06/2020   INFLUENZA VACCINE  09/27/2020    There are no preventive care reminders to display for this patient.   Lab Results  Component Value Date   TSH 0.854 03/08/2020   Lab Results  Component Value Date   WBC 4.5 08/28/2019   HGB 14.3 08/28/2019   HCT 42.4 08/28/2019   MCV 91.4 08/28/2019   PLT 220 08/28/2019    Lab Results  Component Value Date   NA 143 03/08/2020   K 4.0 03/08/2020   CO2 24 03/08/2020   GLUCOSE 79 03/08/2020   BUN 26 03/08/2020   CREATININE 1.06 (H) 03/08/2020   BILITOT 0.4 03/08/2020   ALKPHOS 72 03/08/2020   AST 13 03/08/2020   ALT 15 03/08/2020   PROT 7.0 03/08/2020   ALBUMIN 4.4 03/08/2020   CALCIUM 10.8 (H) 03/08/2020   Lab Results  Component Value Date   CHOL 169 03/08/2020   Lab Results  Component Value Date   HDL 74 03/08/2020   Lab Results  Component Value Date   LDLCALC 82 03/08/2020   Lab Results  Component Value Date   TRIG 67 03/08/2020   Lab Results  Component Value Date   CHOLHDL 2.3 03/08/2020   Lab Results  Component Value Date   HGBA1C 5.4 05/15/2016       Assessment & Plan:   Problem List Items Addressed This Visit       Digestive   Nausea & vomiting    -started after eating at a birthday banquet on Saturday -Rx. zofran -discussed ginger ale and clear liquids, then advancing to [employer], then diet as tol      Relevant Medications   ondansetron (ZOFRAN-ODT) 4 MG disintegrating tablet     Meds ordered this encounter  Medications   ondansetron (ZOFRAN-ODT) 4 MG disintegrating tablet    Sig: Take 1 tablet (4 mg total) by mouth every 8 (eight) hours as needed for nausea or vomiting.    Dispense:  20 tablet    Refill:  0   Date:  02/07/2021   Location of Patient: Home Location of Provider: Office Consent was obtain for visit to be over via telehealth. I verified that I am speaking with the correct person using two identifiers.  I connected with  Laurie Olson on 02/07/21 via telephone and verified that I am speaking with the correct person using two identifiers.   I discussed the limitations of evaluation and management by telemedicine. The patient expressed understanding and agreed to proceed.  Time spent:7 minutes   Heather Roberts, NP

## 2021-02-07 NOTE — Assessment & Plan Note (Signed)
-  started after eating at a birthday banquet on Saturday -Rx. zofran -discussed ginger ale and clear liquids, then advancing to [employer], then diet as tol

## 2021-02-07 NOTE — Patient Instructions (Signed)
Return to clinic if unable to tolerate fluids or food despite medication or if symptoms change.

## 2021-02-07 NOTE — Telephone Encounter (Signed)
Please give this patient a call about her visit this am.  She needs DX and how long Casimiro Needle took her out of work.

## 2021-02-16 ENCOUNTER — Other Ambulatory Visit: Payer: Self-pay | Admitting: Family Medicine

## 2021-03-23 ENCOUNTER — Other Ambulatory Visit: Payer: Self-pay | Admitting: Family Medicine

## 2021-04-26 ENCOUNTER — Telehealth: Payer: Self-pay | Admitting: Family Medicine

## 2021-04-26 NOTE — Telephone Encounter (Signed)
Pt has questions regarding Gall stones, she went to the ER with them, and someone was supposed to notify Dr Moshe Cipro.  She is wanting to talk to a nurse regarding the Gall Stones  and you can reach out via mychart

## 2021-04-26 NOTE — Telephone Encounter (Signed)
Needs an appt for ER follow up with available provider to discuss course of action

## 2021-04-26 NOTE — Telephone Encounter (Signed)
Left voice mail for patient to call office to schedule an appointment. Mychart message sent also.

## 2021-04-28 ENCOUNTER — Other Ambulatory Visit: Payer: Self-pay | Admitting: Family Medicine

## 2021-05-05 ENCOUNTER — Encounter: Payer: Self-pay | Admitting: Family Medicine

## 2021-05-05 ENCOUNTER — Other Ambulatory Visit: Payer: Self-pay

## 2021-05-05 ENCOUNTER — Ambulatory Visit: Payer: BC Managed Care – PPO | Admitting: Family Medicine

## 2021-05-05 VITALS — BP 133/85 | HR 54 | Resp 16 | Ht 66.0 in | Wt 192.4 lb

## 2021-05-05 DIAGNOSIS — K219 Gastro-esophageal reflux disease without esophagitis: Secondary | ICD-10-CM

## 2021-05-05 DIAGNOSIS — K8020 Calculus of gallbladder without cholecystitis without obstruction: Secondary | ICD-10-CM | POA: Diagnosis not present

## 2021-05-05 DIAGNOSIS — I10 Essential (primary) hypertension: Secondary | ICD-10-CM

## 2021-05-05 DIAGNOSIS — R7989 Other specified abnormal findings of blood chemistry: Secondary | ICD-10-CM | POA: Diagnosis not present

## 2021-05-05 DIAGNOSIS — K838 Other specified diseases of biliary tract: Secondary | ICD-10-CM

## 2021-05-05 DIAGNOSIS — Z23 Encounter for immunization: Secondary | ICD-10-CM

## 2021-05-05 DIAGNOSIS — Z09 Encounter for follow-up examination after completed treatment for conditions other than malignant neoplasm: Secondary | ICD-10-CM

## 2021-05-05 DIAGNOSIS — E559 Vitamin D deficiency, unspecified: Secondary | ICD-10-CM

## 2021-05-05 DIAGNOSIS — E785 Hyperlipidemia, unspecified: Secondary | ICD-10-CM

## 2021-05-05 DIAGNOSIS — Z1231 Encounter for screening mammogram for malignant neoplasm of breast: Secondary | ICD-10-CM

## 2021-05-05 MED ORDER — PANTOPRAZOLE SODIUM 20 MG PO TBEC: 20.0000 mg | DELAYED_RELEASE_TABLET | Freq: Every day | ORAL | 5 refills | Status: DC

## 2021-05-05 NOTE — Patient Instructions (Addendum)
Annual exam in 5 months, call if you need me sooner ? ?TdAP and flu vaccine today ? ?Please schedule mammogram at checkout ? ?Labs today, CBC, lipid, cmp and EGFr, TSH, vit D ? ?You are referred to Dr Constance Haw re gallstones ? ?It is important that you exercise regularly at least 30 minutes 5 times a week. If you develop chest pain, have severe difficulty breathing, or feel very tired, stop exercising immediately and seek medical attention  ? ?Think about what you will eat, plan ahead. ?Choose " clean, green, fresh or frozen" over canned, processed or packaged foods which are more sugary, salty and fatty. ?70 to 75% of food eaten should be vegetables and fruit. ?Three meals at set times with snacks allowed between meals, but they must be fruit or vegetables. ?Aim to eat over a 12 hour period , example 7 am to 7 pm, and STOP after  your last meal of the day. ?Drink water,generally about 64 ounces per day, no other drink is as healthy. Fruit juice is best enjoyed in a healthy way, by EATING the fruit. ?Thanks for choosing [facility], we consider it a privelige to serve you. ? ?

## 2021-05-05 NOTE — Assessment & Plan Note (Signed)
Refer to Surgery

## 2021-05-06 ENCOUNTER — Other Ambulatory Visit: Payer: Self-pay

## 2021-05-06 ENCOUNTER — Encounter: Payer: Self-pay | Admitting: Family Medicine

## 2021-05-06 DIAGNOSIS — I10 Essential (primary) hypertension: Secondary | ICD-10-CM

## 2021-05-06 LAB — CMP14+EGFR
ALT: 226 IU/L — ABNORMAL HIGH (ref 0–32)
AST: 165 IU/L — ABNORMAL HIGH (ref 0–40)
Albumin/Globulin Ratio: 1.6 (ref 1.2–2.2)
Albumin: 4.4 g/dL (ref 3.8–4.8)
Alkaline Phosphatase: 349 IU/L — ABNORMAL HIGH (ref 44–121)
BUN/Creatinine Ratio: 9 — ABNORMAL LOW (ref 12–28)
BUN: 10 mg/dL (ref 8–27)
Bilirubin Total: 0.9 mg/dL (ref 0.0–1.2)
CO2: 26 mmol/L (ref 20–29)
Calcium: 11.4 mg/dL — ABNORMAL HIGH (ref 8.7–10.3)
Chloride: 103 mmol/L (ref 96–106)
Creatinine, Ser: 1.13 mg/dL — ABNORMAL HIGH (ref 0.57–1.00)
Globulin, Total: 2.7 g/dL (ref 1.5–4.5)
Glucose: 93 mg/dL (ref 70–99)
Potassium: 3.2 mmol/L — ABNORMAL LOW (ref 3.5–5.2)
Sodium: 142 mmol/L (ref 134–144)
Total Protein: 7.1 g/dL (ref 6.0–8.5)
eGFR: 55 mL/min/{1.73_m2} — ABNORMAL LOW (ref >59)

## 2021-05-06 LAB — CBC
Hematocrit: 43.4 % (ref 34.0–46.6)
Hemoglobin: 14.5 g/dL (ref 11.1–15.9)
MCH: 30.1 pg (ref 26.6–33.0)
MCHC: 33.4 g/dL (ref 31.5–35.7)
MCV: 90 fL (ref 79–97)
Platelets: 223 10*3/uL (ref 150–450)
RBC: 4.82 x10E6/uL (ref 3.77–5.28)
RDW: 12.8 % (ref 11.7–15.4)
WBC: 6.4 10*3/uL (ref 3.4–10.8)

## 2021-05-06 LAB — LIPID PANEL
Chol/HDL Ratio: 2.2 ratio (ref 0.0–4.4)
Cholesterol, Total: 164 mg/dL (ref 100–199)
HDL: 74 mg/dL (ref >39)
LDL Chol Calc (NIH): 68 mg/dL (ref 0–99)
Triglycerides: 127 mg/dL (ref 0–149)
VLDL Cholesterol Cal: 22 mg/dL (ref 5–40)

## 2021-05-06 LAB — TSH: TSH: 0.27 u[IU]/mL — ABNORMAL LOW (ref 0.450–4.500)

## 2021-05-06 LAB — VITAMIN D 25 HYDROXY (VIT D DEFICIENCY, FRACTURES): Vit D, 25-Hydroxy: 48.3 ng/mL (ref 30.0–100.0)

## 2021-05-06 MED ORDER — POTASSIUM CHLORIDE CRYS ER 20 MEQ PO TBCR: 20.0000 meq | EXTENDED_RELEASE_TABLET | Freq: Every day | ORAL | 2 refills | Status: DC

## 2021-05-08 ENCOUNTER — Encounter: Payer: Self-pay | Admitting: Family Medicine

## 2021-05-08 ENCOUNTER — Telehealth: Payer: Self-pay | Admitting: Family Medicine

## 2021-05-08 DIAGNOSIS — K838 Other specified diseases of biliary tract: Secondary | ICD-10-CM | POA: Insufficient documentation

## 2021-05-08 DIAGNOSIS — R7989 Other specified abnormal findings of blood chemistry: Secondary | ICD-10-CM | POA: Insufficient documentation

## 2021-05-08 DIAGNOSIS — Z09 Encounter for follow-up examination after completed treatment for conditions other than malignant neoplasm: Secondary | ICD-10-CM | POA: Insufficient documentation

## 2021-05-08 NOTE — Assessment & Plan Note (Signed)
[place] reports dilated bile duct at [facility], and recommended MRCP, needs GI eval, esp in light of markedly elevated LFT ?

## 2021-05-08 NOTE — Assessment & Plan Note (Signed)
Record form [facility] ED  reviewed ?Radiologic and lab data reviewed  ?Referrals placed for surgery and also GI ?Repeat labs also ordered ?

## 2021-05-08 NOTE — Assessment & Plan Note (Signed)
DASH diet and commitment to daily physical activity for a minimum of 30 minutes discussed and encouraged, as a part of hypertension management. ?The importance of attaining a healthy weight is also discussed. ? ?BP/Weight 05/05/2021 12/07/2020 11/09/2020 10/26/2020 09/28/2020 09/14/2020 08/06/2020  ?Systolic BP 133 137 146 129 131 152 127  ?Diastolic BP 85 85 103 92 79 98 82  ?Wt. (Lbs) 192.4 202 204.4 203.8 205 203 199.4  ?BMI 31.05 32.6 32.99 32.89 33.09 32.77 32.18  ? ? ? ?Controlled, no change in medication ? ?

## 2021-05-08 NOTE — Telephone Encounter (Signed)
Pls let her know that the repeat liver enzymes are better than when she presented to th ED , so that is encouraging ?Still  waiting on hepatitis panel ?She is also referred to GI, Dr Karilyn Cota as there was concern in the ED that her bile duct may be dilated and additional testing  (MRCP)was recommended while in the Ed, she deferred reporting claustrophobia.  ?I have entered the GI referral, she needs r to keep both referral appts , gen surg and GI ?Pls also add free T3 and free t4 to labs due to abn TSH ?She should still rept LFT in 2 weeks as I had recommended unless these are ordered by either GI or gen surg ?

## 2021-05-08 NOTE — Progress Notes (Signed)
? ?  Laurie Olson     MRN: [id number]      DOB: 01/09/1961 ? ? ?HPI ?Laurie Olson is here for follow up of  recent ED visit a [facility] for recurrent vomiting and diarrhea on 2/24, during w/up she was dx with cholelithiasis and advised to f/u with PCP ?Does report intermittent bloating and dyspepsia with certain foods ? Record review reports possible bile duct dilation and possible need for MRCP, GI eval recommended, will need t arrange this ?ROS ?Denies recent fever or chills. ?Denies sinus pressure, nasal congestion, ear pain or sore throat. ?Denies chest congestion, productive cough or wheezing. ?Denies chest pains, palpitations and leg swelling ? ?Denies dysuria, frequency, hesitancy or incontinence. ?Denies joint pain, swelling and limitation in mobility. ?Denies headaches, seizures, numbness, or tingling. ?Denies depression, anxiety or insomnia. ?Denies skin break down or rash. ? ? ?PE ? ?BP 133/85   Pulse (!) 54   Resp 16   Ht 5\' 6"  (1.676 m)   Wt 192 lb 6.4 oz (87.3 kg)   LMP 07/09/2010   SpO2 98%   BMI 31.05 kg/m?  ? ?Patient alert and oriented and in no cardiopulmonary distress. ? ?HEENT: No facial asymmetry, EOMI,     Neck supple . ? ?Chest: Clear to auscultation bilaterally. ? ?CVS: S1, S2 no murmurs, no S3.Regular rate. ? ?ABD: Soft non tender.  ? ?Ext: No edema ? ?MS: Adequate ROM spine, shoulders, hips and knees. ? ?Skin: Intact, no ulcerations or rash noted. ? ?Psych: Good eye contact, normal affect. Memory intact not anxious or depressed appearing. ? ?CNS: CN 2-12 intact, power,  normal throughout.no focal deficits noted. ? ? ?Assessment & Plan ? ?Cholelithiasis ?Refer to Surgery ? ?Dilated bile duct ?09/08/2010 reports dilated bile duct at [facility], and recommended MRCP, needs GI eval, esp in light of markedly elevated LFT ? ?Elevated LFTs ?Obtain hepatitis panel and refer to GI ? ?Encounter for examination following treatment at hospital ?Record form [facility] ED  reviewed ?Radiologic and lab data reviewed  ?Referrals  placed for surgery and also GI ?Repeat labs also ordered ? ?Essential hypertension ?DASH diet and commitment to daily physical activity for a minimum of 30 minutes discussed and encouraged, as a part of hypertension management. ?The importance of attaining a healthy weight is also discussed. ? ?BP/Weight 05/05/2021 12/07/2020 11/09/2020 10/26/2020 09/28/2020 09/14/2020 08/06/2020  ?Systolic BP 133 137 146 129 131 152 127  ?Diastolic BP 85 85 103 92 79 98 82  ?Wt. (Lbs) 192.4 202 204.4 203.8 205 203 199.4  ?BMI 31.05 32.6 32.99 32.89 33.09 32.77 32.18  ? ? ? ?Controlled, no change in medication ? ? ? ?

## 2021-05-08 NOTE — Assessment & Plan Note (Signed)
Obtain hepatitis panel and refer to GI ?

## 2021-05-09 LAB — ACUTE VIRAL HEPATITIS (HAV, HBV, HCV)
HCV Ab: NONREACTIVE
Hep A IgM: NEGATIVE
Hep B C IgM: NEGATIVE
Hepatitis B Surface Ag: NEGATIVE

## 2021-05-09 LAB — SPECIMEN STATUS REPORT

## 2021-05-09 LAB — HCV INTERPRETATION

## 2021-05-09 NOTE — Telephone Encounter (Signed)
Pt aware and notified through mychart  ?

## 2021-05-10 ENCOUNTER — Encounter (INDEPENDENT_AMBULATORY_CARE_PROVIDER_SITE_OTHER): Payer: Self-pay | Admitting: *Deleted

## 2021-05-11 ENCOUNTER — Other Ambulatory Visit: Payer: Self-pay

## 2021-05-11 ENCOUNTER — Other Ambulatory Visit: Payer: Self-pay | Admitting: *Deleted

## 2021-05-11 ENCOUNTER — Ambulatory Visit (HOSPITAL_COMMUNITY): Payer: BC Managed Care – PPO

## 2021-05-11 ENCOUNTER — Encounter: Payer: Self-pay | Admitting: Family Medicine

## 2021-05-11 ENCOUNTER — Ambulatory Visit (INDEPENDENT_AMBULATORY_CARE_PROVIDER_SITE_OTHER): Payer: BC Managed Care – PPO | Admitting: Family Medicine

## 2021-05-11 VITALS — BP 120/72 | HR 64 | Ht 66.0 in | Wt 195.0 lb

## 2021-05-11 DIAGNOSIS — R7989 Other specified abnormal findings of blood chemistry: Secondary | ICD-10-CM

## 2021-05-11 DIAGNOSIS — E785 Hyperlipidemia, unspecified: Secondary | ICD-10-CM | POA: Diagnosis not present

## 2021-05-11 DIAGNOSIS — Z1231 Encounter for screening mammogram for malignant neoplasm of breast: Secondary | ICD-10-CM

## 2021-05-11 DIAGNOSIS — J302 Other seasonal allergic rhinitis: Secondary | ICD-10-CM

## 2021-05-11 DIAGNOSIS — Z1211 Encounter for screening for malignant neoplasm of colon: Secondary | ICD-10-CM | POA: Diagnosis not present

## 2021-05-11 DIAGNOSIS — Z0000 Encounter for general adult medical examination without abnormal findings: Secondary | ICD-10-CM | POA: Diagnosis not present

## 2021-05-11 DIAGNOSIS — Z9109 Other allergy status, other than to drugs and biological substances: Secondary | ICD-10-CM

## 2021-05-11 DIAGNOSIS — I10 Essential (primary) hypertension: Secondary | ICD-10-CM

## 2021-05-11 NOTE — Patient Instructions (Addendum)
? ?  Please give mammogram appointment a checkout, none in  `2 years. Latest appointment please ? ?F/U in early July, call if you need me before ? ?Fasting lipid, cmp and EGFR 5 days before visit ? ?NO crestor ? ?You are referred for colonoscopy,important that you get this ? ?You already have an appt with GI re follow up from ED visit ? ?Please call back surgeon for appointment re gallstones ? ?You have wax in your outer left ear canal, use wax softener oTc for 3 to 5  days , then hopefully you can get wax out with knuckle ?

## 2021-05-12 ENCOUNTER — Encounter: Payer: Self-pay | Admitting: *Deleted

## 2021-05-12 ENCOUNTER — Encounter (INDEPENDENT_AMBULATORY_CARE_PROVIDER_SITE_OTHER): Payer: Self-pay | Admitting: *Deleted

## 2021-05-16 ENCOUNTER — Encounter: Payer: Self-pay | Admitting: Family Medicine

## 2021-05-16 DIAGNOSIS — Z9109 Other allergy status, other than to drugs and biological substances: Secondary | ICD-10-CM | POA: Insufficient documentation

## 2021-05-16 DIAGNOSIS — Z0000 Encounter for general adult medical examination without abnormal findings: Secondary | ICD-10-CM | POA: Insufficient documentation

## 2021-05-16 NOTE — Assessment & Plan Note (Signed)
Uncontrolled, montelukast started  ?

## 2021-05-16 NOTE — Progress Notes (Signed)
? ? ?  Laurie Olson     MRN: [id number]      DOB: Jun 25, 1960 ? ?HPI: ?Patient is in for annual physical exam. ?Beaman/o uncontrolled allergies and requests med be sent. ?Recent labs,  are reviewed. ?Immunization is reviewed , and  updated if needed. ? ? ?PE: ?BP 120/72   Pulse 64   Ht 5\' 6"  (1.676 m)   Wt 195 lb (88.5 kg)   LMP 07/09/2010   SpO2 97%   BMI 31.47 kg/m?  ? ?Pleasant  female, alert and oriented x 3, in no cardio-pulmonary distress. ?Afebrile. ?HEENT ?No facial trauma or asymetry. Sinuses non tender.  ?Extra occullar muscles intact.Marland Kitchen ?Left External ear canal positive cerumen impaction, right ear canal and tM normal, clear, good light reflex ?Neck: supple, no adenopathy,JVD or thyromegaly.No bruits. ? ?Chest: ?Clear to ascultation bilaterally.No crackles or wheezes. ?Non tender to palpation ? ? ? ?Cardiovascular system; ?Heart sounds normal,  S1 and  S2 ,no S3.  No murmur, or thrill. ?Apical beat not displaced ?Peripheral pulses normal. ? ?Abdomen: ?Soft, non tender, no organomegaly or masses. ?No bruits. ?Bowel sounds normal. ?No guarding, tenderness or rebound. ? ? ?Musculoskeletal exam: ?Full ROM of spine, hips , shoulders and knees. ?No deformity ,swelling or crepitus noted. ?No muscle wasting or atrophy.  ? ?Neurologic: ?Cranial nerves 2 to 12 intact. ?Power, tone ,sensation and reflexes normal throughout. ?No disturbance in gait. No tremor. ? ?Skin: ?Intact, no ulceration, erythema , scaling or rash noted. ?Pigmentation normal throughout ? ?Psych; ?Normal mood and affect. Judgement and concentration normal ? ? ?Assessment & Plan:  ?Annual physical exam ?Annual exam as documented. ?Counseling done  re healthy lifestyle involving commitment to 150 minutes exercise per week, heart healthy diet, and attaining healthy weight.The importance of adequate sleep also discussed. ?Regular seat belt use and home safety, is also discussed. ?Changes in health habits are decided on by the patient with goals and  time frames  set for achieving them. ?Immunization and cancer screening needs are specifically addressed at this visit. ? ? ?Seasonal allergies ?Uncontrolled, montelukast started  ? ?

## 2021-05-16 NOTE — Assessment & Plan Note (Signed)

## 2021-05-24 ENCOUNTER — Encounter: Payer: Self-pay | Admitting: General Surgery

## 2021-05-24 ENCOUNTER — Other Ambulatory Visit: Payer: Self-pay

## 2021-05-24 ENCOUNTER — Ambulatory Visit: Payer: BC Managed Care – PPO | Admitting: General Surgery

## 2021-05-24 VITALS — BP 127/85 | HR 59 | Temp 97.6°F | Resp 12 | Ht 66.0 in | Wt 191.0 lb

## 2021-05-24 DIAGNOSIS — K838 Other specified diseases of biliary tract: Secondary | ICD-10-CM | POA: Diagnosis not present

## 2021-05-24 DIAGNOSIS — K8020 Calculus of gallbladder without cholecystitis without obstruction: Secondary | ICD-10-CM | POA: Diagnosis not present

## 2021-05-24 DIAGNOSIS — R7989 Other specified abnormal findings of blood chemistry: Secondary | ICD-10-CM

## 2021-05-24 NOTE — Patient Instructions (Signed)
Get labs, we will see what those show and decide about an MRI to look at the gallbladder and ducts better based on those results. ? ?Cholelithiasis ?Cholelithiasis happens when gallstones form in the gallbladder. The gallbladder stores bile. Bile is a fluid that helps digest fats. Bile can harden and form into gallstones. If they cause a blockage, they can cause pain (gallbladder attack). ?What are the causes? ?This condition may be caused by: ?Some blood diseases, such as sickle cell anemia. ?Too much of a fat-like substance (cholesterol) in your bile. ?Not enough bile salts in your bile. These salts help the body absorb and digest fats. ?The gallbladder not emptying fully or often enough. This is common in pregnant women. ?What increases the risk? ?The following factors may make you more likely to develop this condition: ?Being female. ?Being pregnant many times. ?Eating a lot of fried foods, fat, and refined carbs (refined carbohydrates). ?Being very overweight (obese). ?Being older than age 71. ?Using medicines with female hormones in them for a long time. ?Losing weight fast. ?Having gallstones in your family. ?Having some health problems, such as diabetes, Crohn's disease, or liver disease. ?What are the signs or symptoms? ?Often, there may be gallstones but no symptoms. These gallstones are called silent gallstones. If a gallstone causes a blockage, you may get sudden pain. The pain: ?Can be in the upper right part of your belly (abdomen). ?Normally comes at night or after you eat. ?Can last an hour or more. ?Can spread to your right shoulder, back, or chest. ?Can feel like discomfort, burning, or fullness in the upper part of your belly (indigestion). ?If the blockage lasts more than a few hours, you can get an infection or swelling. You may: ?Feel like you may vomit. ?Vomit. ?Feel bloated. ?Have belly pain for 5 hours or more. ?Feel tender in your belly, often in the upper right part and under your  ribs. ?Have fever or chills. ?Have skin or the white parts of your eyes turn yellow (jaundice). ?Have dark pee (urine) or pale poop (stool). ?How is this treated? ?Treatment for this condition depends on how bad you feel. If you have symptoms, you may need: ?Home care, if symptoms are not very bad. ?Do not eat for 12-24 hours. Drink only water and clear liquids. ?Start to eat simple or clear foods after 1 or 2 days. Try broths and crackers. ?You may need medicines for pain or stomach upset or both. ?If you have an infection, you will need antibiotics. ?A hospital stay, if you have very bad pain or a very bad infection. ?Surgery to remove your gallbladder. You may need this if: ?Gallstones keep coming back. ?You have very bad symptoms. ?Medicines to break up gallstones. Medicines: ?Are best for small gallstones. ?May be used for up to 6-12 months. ?A procedure to find and take out gallstones or to break up gallstones. ?Follow these instructions at home: ?Medicines ?Take over-the-counter and prescription medicines only as told by your doctor. ?If you were prescribed an antibiotic medicine, take it as told by your doctor. Do not stop taking the antibiotic even if you start to feel better. ?Ask your doctor if the medicine prescribed to you requires you to avoid driving or using machinery. ?Eating and drinking ?Drink enough fluid to keep your urine pale yellow. Drink water or clear fluids. This is important when you have pain. ?Eat healthy foods. Choose: ?Fewer fatty foods, such as fried foods. ?Fewer refined carbs. Avoid breads and grains that are  highly processed, such as white bread and white rice. Choose whole grains, such as whole-wheat bread and brown rice. ?More fiber. Almonds, fresh fruit, and beans are healthy sources. ?General instructions ?Keep a healthy weight. ?Keep all follow-up visits as told by your doctor. This is important. ?Where to find more information ?[employer] and [employer]estive  and Kidney Diseases: [employer].tnwww.niddk.nih.gov ?Contact a doctor if: ?You have sudden pain in the upper right part of your belly. Pain might spread to your right shoulder, back, or chest. ?You have been diagnosed with gallstones that have no symptoms and you get: ?Belly pain. ?Discomfort, burning, or fullness in the upper part of your abdomen. ?You have dark urine or pale stools. ?Get help right away if: ?You have sudden pain in the upper right part of your abdomen, and the pain lasts more than 2 hours. ?You have pain in your abdomen, and: ?It lasts more than 5 hours. ?It keeps getting worse. ?You have a fever or chills. ?You keep feeling like you may vomit. ?You keep vomiting. ?Your skin or the white parts of your eyes turn yellow. ?Summary ?Cholelithiasis happens when gallstones form in the gallbladder. ?This condition may be caused by a blood disease, too much of a fat-like substance in the bile, or not enough bile salts in bile. ?Treatment for this condition depends on how bad you feel. ?If you have symptoms, do not eat or drink. You may need medicines. You may need a hospital stay for very bad pain or a very bad infection. ?You may need surgery if gallstones keep coming back or if you have very bad symptoms. ?This information is not intended to replace advice given to you by your health care provider. Make sure you discuss any questions you have with your health care provider. ?Document Revised: 04/04/2019 Document Reviewed: 01/06/2019 ?Elsevier Patient Education ? 2022 [employer]. ? ?Minimally Invasive Cholecystectomy ?Minimally invasive cholecystectomy is surgery to remove the gallbladder. The gallbladder is a pear-shaped organ that lies beneath the liver on the right side of the body. The gallbladder stores bile, which is a fluid that helps the body digest fats. Cholecystectomy is often done to treat inflammation (irritation and swelling) of the gallbladder (cholecystitis). This condition is usually caused by a  buildup of gallstones (cholelithiasis) in the gallbladder or when the fluid in the gall bladder becomes stagnant because gallstones get stuck in the ducts (tubes) and block the flow of bile. This can result in inflammation and pain. In severe cases, emergency surgery may be required. ?This procedure is done through small incisions in the abdomen, instead of one large incision. It is also called laparoscopic surgery. A thin scope with a camera (laparoscope) is inserted through one incision. Then surgical instruments are inserted through the other incisions. In some cases, a minimally invasive surgery may need to be changed to a surgery that is done through a larger incision. This is called open surgery. ?Tell a health care provider about: ?Any allergies you have. ?All medicines you are taking, including vitamins, herbs, eye drops, creams, and over-the-counter medicines. ?Any problems you or family members have had with anesthetic medicines. ?Any bleeding problems you have. ?Any surgeries you have had. ?Any medical conditions you have. ?Whether you are pregnant or may be pregnant. ?What are the risks? ?Generally, this is a safe procedure. However, problems may occur, including: ?Infection. ?Bleeding. ?Allergic reactions to medicines. ?Damage to nearby structures or organs. ?A gallstone remaining in the common bile duct. The common bile duct  carries bile from the gallbladder to the small intestine. ?A bile leak from the liver or cystic duct after your gallbladder is removed. ?What happens before the procedure? ?Medicines ?Ask your health care provider about: ?Changing or stopping your regular medicines. This is especially important if you are taking diabetes medicines or blood thinners. ?Taking medicines such as aspirin and ibuprofen. These medicines can thin your blood. Do not take these medicines unless your health care provider tells you to take them. ?Taking over-the-counter medicines, vitamins, herbs, and  supplements. ?General instructions ?If you will be going home right after the procedure, plan to have a responsible adult: ?Take you home from the hospital or clinic. You will not be allowed to drive. ?Care for you for t

## 2021-05-24 NOTE — Progress Notes (Signed)
[facility] History and Physical ? ?Reason for Referral: Gallstones, Elevated LFTs ?Referring Physician: Kerri PerchesSimpson, Margaret E, MD ? ?Chief Complaint   ?New Patient (Initial Visit) ?  ? ? ?Laurie Olson is a 61 y.o. female.  ?HPI: Laurie Olson is a 61 yo who comes in with findings of  gallstones and elevated LFTs after an ED visit to [facility]. She had been feeling bad for 2 weeks and her son felt poorly too. They thought they had a GI bug and had been having nausea/vomiting and diarrhea. She said he had the same symptoms. They were seen in the ED and had fluids and her lab work came back with elevated LFTs. She then had an US that demonstrated stones and a dilated CBD to 8mm but no obvious cholecystitis or common duct stone. ? ?She denies any RUQ pain and denies any pain with fatty meals. She says she feels normal now. She was seen by Dr. Lodema HongSimpson who repeated labs and the LFTs remained elevated. She was sent to me to discuss the options.  ? ?No reported cancers in the family. ? ?Past Medical History:  ?Diagnosis Date  ? Allergic rhinitis   ? Anemia   ? Constipation   ? Hypertension   ? ? ?Past Surgical History:  ?Procedure Laterality Date  ? CESAREAN SECTION    ? x2  ? ? ?Family History  ?Problem Relation Age of Onset  ? Hypertension Mother 170  ? Hypertension Sister [id number]  ? Allergies Son   ? Eczema Son   ? Asthma Son   ? ? ?Social History  ? ?Tobacco Use  ? Smoking status: Former  ?  Packs/day: 2.00  ?  Types: Cigarettes  ? Smokeless tobacco: Never  ?Substance Use Topics  ? Alcohol use: No  ? Drug use: No  ? ? ?Medications: I have reviewed the patient's current medications. ?Allergies as of 05/24/2021   ? ?   Reactions  ? Hydrocodone-acetaminophen   ? REACTION: nausea, vomiting  ? ?  ? ?  ?Medication List  ?  ? ?  ? Accurate as of May 24, 2021 10:09 AM. If you have any questions, ask your nurse or doctor.  ?  ?  ? ?  ? ?amLODipine 10 MG tablet ?Commonly known as: NORVASC ?Take 1 tablet by mouth  once daily ?  ?betamethasone dipropionate 0.05 % cream ?Apply sparingly twice daily to hands for 1 week, then as needed ?  ?cephALEXin 500 MG capsule ?Commonly known as: KEFLEX ?Take 500 mg by mouth 3 (three) times daily. ?  ?clotrimazole-betamethasone cream ?Commonly known as: LOTRISONE ?APPLY TOPICALLY TWICE DAILY FOR 10 DAYS TO RASH, THEN AS NEEDED ?  ?fluticasone 50 MCG/ACT nasal spray ?Commonly known as: FLONASE ?Use 1 spray(s) in each nostril once daily ?  ?metoprolol succinate 25 MG 24 hr tablet ?Commonly known as: TOPROL-XL ?Take 1 tablet by mouth once daily ?  ?montelukast 10 MG tablet ?Commonly known as: SINGULAIR ?TAKE 1 TABLET BY MOUTH AT BEDTIME ?  ?pantoprazole 20 MG tablet ?Commonly known as: PROTONIX ?Take 1 tablet (20 mg total) by mouth daily. ?  ?potassium chloride SA 20 MEQ tablet ?Commonly known as: KLOR-CON M ?Take 1 tablet (20 mEq total) by mouth daily. ?  ?Vitamin D (Ergocalciferol) 1.25 MG (50000 UNIT) Caps capsule ?Commonly known as: DRISDOL ?Take 1 capsule by mouth once a week ?  ? ?  ? ? ? ?ROS:  ?A comprehensive review of systems was negative except for:  Ears, nose, mouth, throat, and face: positive for sinus issues ? ?Blood pressure 127/85, pulse (!) 59, temperature 97.6 ?F (36.4 ?C), temperature source Other (Comment), resp. rate 12, height 5\' 6"  (1.676 m), weight 191 lb (86.6 kg), last menstrual period 07/09/2010, SpO2 98 %. ?Physical Exam ?Vitals reviewed.  ?Constitutional:   ?   Appearance: Normal appearance.  ?HENT:  ?   Head: Normocephalic.  ?   Nose: Nose normal.  ?   Mouth/Throat:  ?   Mouth: Mucous membranes are moist.  ?Eyes:  ?   Extraocular Movements: Extraocular movements intact.  ?Cardiovascular:  ?   Rate and Rhythm: Normal rate and regular rhythm.  ?Pulmonary:  ?   Effort: Pulmonary effort is normal.  ?   Breath sounds: Normal breath sounds.  ?Abdominal:  ?   General: There is no distension.  ?   Palpations: Abdomen is soft.  ?   Tenderness: There is no abdominal  tenderness.  ?Musculoskeletal:     ?   General: Normal range of motion.  ?   Cervical back: Normal range of motion.  ?Skin: ?   General: Skin is warm.  ?Neurological:  ?   General: No focal deficit present.  ?   Mental Status: She is alert and oriented to person, place, and time.  ?Psychiatric:     ?   Mood and Affect: Mood normal.     ?   Behavior: Behavior normal.     ?   Thought Content: Thought content normal.  ? ? ?Results: ?CLINICAL DATA:  Abdominal pain with nausea and vomiting for 2 weeks.  ?Elevated LFTs  ? ?EXAM:  ?ULTRASOUND ABDOMEN LIMITED RIGHT UPPER QUADRANT  ? ?COMPARISON:  None.  ? ?FINDINGS:  ?Gallbladder:  ? ?There are multiple small stones and sludge in the gallbladder. No  ?gallbladder wall thickening. Negative sonographic Murphy sign.  ? ?Common bile duct:  ? ?Diameter: The common bile duct measures 0.8 cm, slightly enlarged.  ? ?Liver:  ? ?No focal lesion identified. Within normal limits in parenchymal  ?echogenicity. Portal vein is patent on color Doppler imaging with  ?normal direction of blood flow towards the liver.  ? ?Other: Incidentally noted small right renal cyst. ? ? ?09/08/2010, MD - 04/22/2021  ?Formatting of this note might be different from the original.  ?CLINICAL DATA:  Abdominal pain with nausea and vomiting for 2 weeks.  ?Elevated LFTs  ? ?EXAM:  ?ULTRASOUND ABDOMEN LIMITED RIGHT UPPER QUADRANT  ? ?COMPARISON:  None.  ? ?FINDINGS:  ?Gallbladder:  ? ?There are multiple small stones and sludge in the gallbladder. No  ?gallbladder wall thickening. Negative sonographic Murphy sign.  ? ?Common bile duct:  ? ?Diameter: The common bile duct measures 0.8 cm, slightly enlarged.  ? ?Liver:  ? ?No focal lesion identified. Within normal limits in parenchymal  ?echogenicity. Portal vein is patent on color Doppler imaging with  ?normal direction of blood flow towards the liver.  ? ?Other: Incidentally noted small right renal cyst.  ? ?IMPRESSION:  ?1.  Cholelithiasis without  evidence of acute cholecystitis.  ? ?2. Borderline enlargement of the common bile duct. If there is  ?concern for biliary duct obstruction consider MRI/MRCP.  ? ? ?Electronically Signed  ?  By: 04/24/2021 M.D.  ?  On: 04/22/2021 10:24 ? ? ?Assessment & Plan:  ?Laurie Olson is a 61 y.o. female with stone and elevated liver test and no symptoms. Discussed the options of doing repeat labs and  potentially MRCP versus cholecystectomy and intraoperative cholangiogram and liver biopsy.  ? ?I counseled the patient about the indication, risks and benefits of laparoscopic cholecystectomy.  She understands there is a very small chance for bleeding, infection, injury to normal structures (including common bile duct), conversion to open surgery, persistent symptoms, evolution of postcholecystectomy diarrhea, need for secondary interventions, anesthesia reaction, cardiopulmonary issues and other risks not specifically detailed here. I described the expected recovery, the plan for follow-up and the restrictions during the recovery phase.  Discussed the cholangiogram and the liver biopsy to ensure no duct stones and no issues with her liver given the elevations.  ? ?Right now she wants to start with the Labs and MRCP.  ? ?She went after the office visit and continues to have elevated LFTs. ? Latest Reference Range & Units 05/24/21 10:58  ?Sodium 135 - 146 mmol/L 141  ?Potassium 3.5 - 5.3 mmol/L 4.4  ?Chloride 98 - 110 mmol/L 106  ?CO2 20 - 32 mmol/L 27  ?Glucose 65 - 139 mg/dL 89  ?BUN 7 - 25 mg/dL 16  ?Creatinine 0.50 - 1.05 mg/dL 8.36  ?Calcium 8.6 - 10.4 mg/dL 62.9 (H)  ?BUN/Creatinine Ratio 6 - 22 (calc) NOT APPLICABLE  ?AG Ratio 1.0 - 2.5 (calc) 1.6  ?AST 10 - 35 U/L 76 (H)  ?ALT 6 - 29 U/L 137 (H)  ?Total Protein 6.1 - 8.1 g/dL 7.0  ?Bilirubin, Direct 0.0 - 0.2 mg/dL 0.3 (H)  ?Indirect Bilirubin 0.2 - 1.2 mg/dL (calc) 0.6  ?Total Bilirubin 0.2 - 1.2 mg/dL 0.9  ?(H): Data is abnormally high ?- ?MRCP was ordered. Will  call with the results.  ?Information on gallstones given to the patient so she can be mindful of her symptoms.  ? ?Future Appointments  ?Date Time Provider Department Center  ?06/01/2021  7:00 AM [facility]MR 1 [facility]

## 2021-05-25 ENCOUNTER — Encounter: Payer: Self-pay | Admitting: *Deleted

## 2021-05-25 ENCOUNTER — Other Ambulatory Visit: Payer: Self-pay | Admitting: *Deleted

## 2021-05-25 DIAGNOSIS — K838 Other specified diseases of biliary tract: Secondary | ICD-10-CM

## 2021-05-25 DIAGNOSIS — R7989 Other specified abnormal findings of blood chemistry: Secondary | ICD-10-CM

## 2021-05-25 LAB — HEPATIC FUNCTION PANEL
AG Ratio: 1.6 (calc) (ref 1.0–2.5)
ALT: 137 U/L — ABNORMAL HIGH (ref 6–29)
AST: 76 U/L — ABNORMAL HIGH (ref 10–35)
Albumin: 4.3 g/dL (ref 3.6–5.1)
Alkaline phosphatase (APISO): 206 U/L — ABNORMAL HIGH (ref 37–153)
Bilirubin, Direct: 0.3 mg/dL — ABNORMAL HIGH (ref 0.0–0.2)
Globulin: 2.7 g/dL (calc) (ref 1.9–3.7)
Indirect Bilirubin: 0.6 mg/dL (calc) (ref 0.2–1.2)
Total Bilirubin: 0.9 mg/dL (ref 0.2–1.2)
Total Protein: 7 g/dL (ref 6.1–8.1)

## 2021-05-25 LAB — BASIC METABOLIC PANEL
BUN: 16 mg/dL (ref 7–25)
CO2: 27 mmol/L (ref 20–32)
Calcium: 11.1 mg/dL — ABNORMAL HIGH (ref 8.6–10.4)
Chloride: 106 mmol/L (ref 98–110)
Creat: 0.94 mg/dL (ref 0.50–1.05)
Glucose, Bld: 89 mg/dL (ref 65–139)
Potassium: 4.4 mmol/L (ref 3.5–5.3)
Sodium: 141 mmol/L (ref 135–146)

## 2021-05-25 NOTE — Progress Notes (Signed)
Liver test are coming down but remain elevated and not back to normal. We can order an MRCP to further assess if she has stones in the bile duct given this and to look at the gallbladder better. Please order MRCP and it needs to be done in the next week.

## 2021-06-01 ENCOUNTER — Ambulatory Visit (HOSPITAL_COMMUNITY): Admission: RE | Admit: 2021-06-01 | Payer: BC Managed Care – PPO | Source: Ambulatory Visit

## 2021-06-04 ENCOUNTER — Other Ambulatory Visit: Payer: Self-pay | Admitting: Family Medicine

## 2021-06-16 ENCOUNTER — Telehealth (INDEPENDENT_AMBULATORY_CARE_PROVIDER_SITE_OTHER): Payer: Self-pay

## 2021-06-16 ENCOUNTER — Encounter (INDEPENDENT_AMBULATORY_CARE_PROVIDER_SITE_OTHER): Payer: Self-pay | Admitting: Gastroenterology

## 2021-06-16 ENCOUNTER — Ambulatory Visit (INDEPENDENT_AMBULATORY_CARE_PROVIDER_SITE_OTHER): Payer: BC Managed Care – PPO | Admitting: Gastroenterology

## 2021-06-16 ENCOUNTER — Encounter (INDEPENDENT_AMBULATORY_CARE_PROVIDER_SITE_OTHER): Payer: Self-pay

## 2021-06-16 ENCOUNTER — Other Ambulatory Visit (INDEPENDENT_AMBULATORY_CARE_PROVIDER_SITE_OTHER): Payer: Self-pay

## 2021-06-16 VITALS — BP 115/80 | HR 80 | Temp 98.0°F | Ht 66.0 in | Wt 188.3 lb

## 2021-06-16 DIAGNOSIS — R7989 Other specified abnormal findings of blood chemistry: Secondary | ICD-10-CM

## 2021-06-16 MED ORDER — PEG 3350-KCL-NA BICARB-NACL 420 G PO SOLR: 4000.0000 mL | ORAL | 0 refills | Status: DC

## 2021-06-16 NOTE — Telephone Encounter (Signed)
Michala Deblanc Ann Aaralyn Kil, CMA  ?

## 2021-06-16 NOTE — Patient Instructions (Addendum)
Proceed with MRCP ordered by Dr. Henreitta Leber ?Perform blood workup ?Schedule colonoscopy ? ?

## 2021-06-16 NOTE — Progress Notes (Signed)
Laurie Olson, M.D. ?Gastroenterology & Hepatology ?[facility] ?[address] ?[place], [place] [address] ?Primary Care Physician: ?Kerri Perches, MD ?[address] ?[place] [address] ? ?Referring MD: PCP ? ?Chief Complaint: Elevated LFTs ? ?History of Present Illness: ?Laurie Olson is a 61 y.o. female with past medical history of hypertension, rhinitis and constipation, who presents for evaluation of elevated LFTs. ? ?Patient states that in late February 2022 she presented new onset of epigastric abdominal pain, nausea and vomiting with general malaise. States that her son also had these symptoms at the same time, and her granddaughter had similar symptoms 2 weeks before then.  Due to this, the patient went to [facility] on 04/22/2021.  Blood work-up showed negative acute hepatitis panel, CBC with mildly decreased white blood cell count of 3.5, normal hemoglobin 13.8 and platelets 212, CMP showed elevation of AST up to 357, ALT 501, alkaline phosphatase 229, total bili of 2.0. Patient underwent ultrasound of the right upper quadrant on 04/22/2018 that showed CBD of 0.8 cm without presence of any other abnormalities in the hepatobiliary tree, there was presence of gallstones in the gallbladder without gallbladder thickening. States that after getting some IV fluids and medication for nausea her symptoms improved - took 4 days to recover from her disease. She denies taking any OTC medication when her symptoms started. ? ?The patient denies any complaints at the moment.  States that she has been asymptomatic after her initial event.  She denies having any nausea, vomiting, fever, chills, hematochezia, melena, hematemesis, abdominal distention, abdominal pain, diarrhea, jaundice, pruritus. Has been losing weight on purpose as she is trying to change her dietary and exercise habits. She takes a stool softener to move her bowels as she has hard  stools, with this medication she moves her bowels daily or every other day. ? ?Notably, the patient was seen by Dr. Henreitta Leber on 05/24/2021 for elevated liver enzymes.  The consultation was held regarding proceeding with MRCP for evaluation of elevated liver enzymes and mildly dilated CBD.  She had to reschedule her MRCP. ? ?Most recent blood work-up from 05/16/2021 showed ALT 137, AST 76, total bilirubin 0.9, alkaline phosphatase 206, normal renal function and electrolytes. ? ?Importantly, the patient had normal liver enzymes on blood work-up performed on 03/08/2020. ? ?Last YPP:[id number] ?Last Colonoscopy:never ? ?FHx: neg for any gastrointestinal/liver disease, no malignancies ?Social: neg smoking, alcohol or illicit drug use ?Surgical: c-sections x2 ? ?Past Medical History: ?Past Medical History:  ?Diagnosis Date  ? Allergic rhinitis   ? Anemia   ? Constipation   ? Hypertension   ? ? ?Past Surgical History: ?Past Surgical History:  ?Procedure Laterality Date  ? CESAREAN SECTION    ? x2  ? ? ?Family History: ?Family History  ?Problem Relation Age of Onset  ? Hypertension Mother 67  ? Hypertension Sister 53  ? Allergies Son   ? Eczema Son   ? Asthma Son   ? ? ?Social History: ?Social History  ? ?Tobacco Use  ?Smoking Status Former  ? Packs/day: 2.00  ? Types: Cigarettes  ?Smokeless Tobacco Never  ? ?Social History  ? ?Substance and Sexual Activity  ?Alcohol Use No  ? ?Social History  ? ?Substance and Sexual Activity  ?Drug Use No  ? ? ?Allergies: ?Allergies  ?Allergen Reactions  ? Hydrocodone-Acetaminophen   ?  REACTION: nausea, vomiting  ? ? ?Medications: ?Current Outpatient Medications  ?Medication Sig Dispense Refill  ?  amLODipine (NORVASC) 10 MG tablet Take 1 tablet by mouth once daily 30 tablet 0  ? betamethasone dipropionate 0.05 % cream Apply sparingly twice daily to hands for 1 week, then as needed 45 g 0  ? clotrimazole-betamethasone (LOTRISONE) cream APPLY TOPICALLY TWICE DAILY FOR 10 DAYS TO RASH, THEN AS  NEEDED 45 g 0  ? fluticasone (FLONASE) 50 MCG/ACT nasal spray Use 1 spray(s) in each nostril once daily 16 g 5  ? metoprolol succinate (TOPROL-XL) 25 MG 24 hr tablet Take 1 tablet by mouth once daily 30 tablet 0  ? montelukast (SINGULAIR) 10 MG tablet TAKE 1 TABLET BY MOUTH AT BEDTIME 30 tablet 0  ? pantoprazole (PROTONIX) 20 MG tablet Take 1 tablet (20 mg total) by mouth daily. 30 tablet 5  ? potassium chloride SA (KLOR-CON M) 20 MEQ tablet Take 1 tablet (20 mEq total) by mouth daily. 30 tablet 2  ? Vitamin D, Ergocalciferol, (DRISDOL) 1.25 MG (50000 UNIT) CAPS capsule Take 1 capsule by mouth once a week 12 capsule 0  ? ?No current facility-administered medications for this visit.  ? ? ?Review of Systems: ?GENERAL: negative for malaise, night sweats ?HEENT: No changes in hearing or vision, no nose bleeds or other nasal problems. ?NECK: Negative for lumps, goiter, pain and significant neck swelling ?RESPIRATORY: Negative for cough, wheezing ?CARDIOVASCULAR: Negative for chest pain, leg swelling, palpitations, orthopnea ?GI: SEE HPI ?MUSCULOSKELETAL: Negative for joint pain or swelling, back pain, and muscle pain. ?SKIN: Negative for lesions, rash ?PSYCH: Negative for sleep disturbance, mood disorder and recent psychosocial stressors. ?HEMATOLOGY Negative for prolonged bleeding, bruising easily, and swollen nodes. ?ENDOCRINE: Negative for cold or heat intolerance, polyuria, polydipsia and goiter. ?NEURO: negative for tremor, gait imbalance, syncope and seizures. ?The remainder of the review of systems is noncontributory. ? ? ?Physical Exam: ?BP 115/80 (BP Location: Left Arm, Patient Position: Sitting, Cuff Size: Large)   Pulse 80   Temp 98 ?F (36.7 ?C) (Oral)   Ht 5\' 6"  (1.676 m)   Wt 188 lb 4.8 oz (85.4 kg)   LMP 07/09/2010   BMI 30.39 kg/m?  ?GENERAL: The patient is AO x3, in no acute distress. ?HEENT: Head is normocephalic and atraumatic. EOMI are intact. Mouth is well hydrated and without lesions. ?NECK:  Supple. No masses ?LUNGS: Clear to auscultation. No presence of rhonchi/wheezing/rales. Adequate chest expansion ?HEART: RRR, normal s1 and s2. ?ABDOMEN: Soft, nontender, no guarding, no peritoneal signs, and nondistended. BS +. No masses. ?EXTREMITIES: Without any cyanosis, clubbing, rash, lesions or edema. ?NEUROLOGIC: AOx3, no focal motor deficit. ?SKIN: no jaundice, no rashes ? ? ?Imaging/Labs: ?as above ? ?I personally reviewed and interpreted the available labs, imaging and endoscopic files. ? ?Impression and Plan: ?Laurie Olson is a 61 y.o. female with past medical history of hypertension, rhinitis and constipation, who presents for evaluation of elevated LFTs.  The patient had new onset of nausea and abdominal pain along with new onset of elevation of her LFTs.  Her LFTs showed a mixed pattern but there was also presence of a borderline dilated CBD without presence of any obstructing stones or masses.  I do not consider that she presented an acute episode of cholecystitis as her brother and granddaughter had similar symptoms.  It is possible that the elevation of her LFTs was related to a transient viral infection.  Importantly her LFTs have slowly down trended since then.  At this point we will evaluate for autoimmune or metabolic etiologies, as well as hepatitis A, B  and C will be checked.  She will proceed with a schedule MRCP ordered by Dr. Henreitta Leber. ? ?Finally, she is due for colorectal cancer screening, will schedule colonoscopy. ? ?- Proceed with MRCP ordered by Dr. Henreitta Leber ?- Check CMP hepatitis A/B/C, iron panel, ANA, AMA, ASMA, IgG ?- Schedule colonoscopy ? ?All questions were answered.     ? ?Laurie Blazing, MD ?Gastroenterology and Hepatology ?[facility] ? ?

## 2021-06-21 ENCOUNTER — Encounter: Payer: Self-pay | Admitting: Family Medicine

## 2021-06-21 LAB — CBC WITH DIFFERENTIAL/PLATELET
Absolute Monocytes: 383 cells/uL (ref 200–950)
Basophils Absolute: 32 cells/uL (ref 0–200)
Basophils Relative: 0.7 %
Eosinophils Absolute: 81 cells/uL (ref 15–500)
Eosinophils Relative: 1.8 %
HCT: 41.6 % (ref 35.0–45.0)
Hemoglobin: 14 g/dL (ref 11.7–15.5)
Lymphs Abs: 1908 cells/uL (ref 850–3900)
MCH: 31 pg (ref 27.0–33.0)
MCHC: 33.7 g/dL (ref 32.0–36.0)
MCV: 92.2 fL (ref 80.0–100.0)
MPV: 10.6 fL (ref 7.5–12.5)
Monocytes Relative: 8.5 %
Neutro Abs: 2097 cells/uL (ref 1500–7800)
Neutrophils Relative %: 46.6 %
Platelets: 243 10*3/uL (ref 140–400)
RBC: 4.51 10*6/uL (ref 3.80–5.10)
RDW: 12.9 % (ref 11.0–15.0)
Total Lymphocyte: 42.4 %
WBC: 4.5 10*3/uL (ref 3.8–10.8)

## 2021-06-21 LAB — COMPREHENSIVE METABOLIC PANEL
AG Ratio: 1.5 (calc) (ref 1.0–2.5)
ALT: 218 U/L — ABNORMAL HIGH (ref 6–29)
AST: 209 U/L — ABNORMAL HIGH (ref 10–35)
Albumin: 4.2 g/dL (ref 3.6–5.1)
Alkaline phosphatase (APISO): 283 U/L — ABNORMAL HIGH (ref 37–153)
BUN: 14 mg/dL (ref 7–25)
CO2: 25 mmol/L (ref 20–32)
Calcium: 11.2 mg/dL — ABNORMAL HIGH (ref 8.6–10.4)
Chloride: 104 mmol/L (ref 98–110)
Creat: 0.93 mg/dL (ref 0.50–1.05)
Globulin: 2.8 g/dL (calc) (ref 1.9–3.7)
Glucose, Bld: 98 mg/dL (ref 65–99)
Potassium: 3.9 mmol/L (ref 3.5–5.3)
Sodium: 138 mmol/L (ref 135–146)
Total Bilirubin: 1.5 mg/dL — ABNORMAL HIGH (ref 0.2–1.2)
Total Protein: 7 g/dL (ref 6.1–8.1)

## 2021-06-21 LAB — HCV RNA, QUANT REAL-TIME PCR W/REFLEX
HCV RNA, PCR, QN (Log): <1.18 LogIU/mL
HCV RNA, PCR, QN: <15 IU/mL

## 2021-06-21 LAB — MITOCHONDRIAL ANTIBODIES: Mitochondrial M2 Ab, IgG: <=20 U (ref <=20.0)

## 2021-06-21 LAB — HEPATITIS B DNA, ULTRAQUANTITATIVE, PCR
Hepatitis B DNA (Calc): <1 Log IU/mL
Hepatitis B DNA: <10 IU/mL

## 2021-06-21 LAB — ANTI-NUCLEAR AB-TITER (ANA TITER): ANA Titer 1: 1:40 {titer} — ABNORMAL HIGH

## 2021-06-21 LAB — FERRITIN: Ferritin: 504 ng/mL — ABNORMAL HIGH (ref 16–288)

## 2021-06-21 LAB — IRON, TOTAL/TOTAL IRON BINDING CAP
%SAT: 50 % (calc) — ABNORMAL HIGH (ref 16–45)
Iron: 144 ug/dL (ref 45–160)
TIBC: 288 mcg/dL (calc) (ref 250–450)

## 2021-06-21 LAB — ANTI-SMOOTH MUSCLE ANTIBODY, IGG: Actin (Smooth Muscle) Antibody (IGG): <20 U (ref <20)

## 2021-06-21 LAB — ANA: Anti Nuclear Antibody (ANA): POSITIVE — AB

## 2021-06-21 LAB — HEPATITIS A ANTIBODY, TOTAL: Hepatitis A AB,Total: REACTIVE — AB

## 2021-06-21 LAB — IGG: IgG (Immunoglobin G), Serum: 1378 mg/dL (ref 600–1540)

## 2021-06-22 ENCOUNTER — Other Ambulatory Visit (INDEPENDENT_AMBULATORY_CARE_PROVIDER_SITE_OTHER): Payer: Self-pay | Admitting: Gastroenterology

## 2021-06-22 DIAGNOSIS — R7989 Other specified abnormal findings of blood chemistry: Secondary | ICD-10-CM

## 2021-06-22 NOTE — Telephone Encounter (Signed)
LMTRC

## 2021-06-28 ENCOUNTER — Telehealth: Payer: Self-pay | Admitting: Family Medicine

## 2021-06-28 NOTE — Telephone Encounter (Signed)
I spoke directly with her, she needs to conctact Gen Surg re ERCP that was recommended and ordewred and make hem aware she continues to have pain, nausea and bloating. In the interim, should those symptoms worsen she will need to go to the ED, she understands ?

## 2021-06-28 NOTE — Telephone Encounter (Signed)
I didn't see a message where she was told this but I do see she is seeing GI. Should she call them or schedule appt with you? ?

## 2021-06-28 NOTE — Telephone Encounter (Signed)
Pt called stating she is having gallbladder issues. Pt states she was told if it was to get worse to call into the office? ? ? ?

## 2021-06-30 ENCOUNTER — Encounter: Payer: Self-pay | Admitting: Family Medicine

## 2021-07-08 ENCOUNTER — Telehealth: Payer: Self-pay | Admitting: *Deleted

## 2021-07-08 NOTE — Telephone Encounter (Signed)
Call placed to patient and patient made aware.  ? ?Verbalized understanding.  ? ?Appointment scheduled with Dr. Constance Haw after imaging.  ?

## 2021-07-08 NOTE — Telephone Encounter (Signed)
Received call from patient [phone]~ telephone.  ? ?Reports that RUQ pain has worsened. States that she has nausea and bloating as well.  ? ?Noted that patient did not have MRI MRCP performed as directed by Dr. Henreitta Leber. Re-scheduled imaging for 07/11/2021 @ 1pm. Patient to arrive at [facility] at 12:30pm and to be NPO x4 hours.  ? ?Call placed to patient. LMTRC. Message sent to patient via MyChart. ?

## 2021-07-11 ENCOUNTER — Encounter: Payer: Self-pay | Admitting: Family Medicine

## 2021-07-11 ENCOUNTER — Ambulatory Visit (HOSPITAL_COMMUNITY)
Admission: RE | Admit: 2021-07-11 | Discharge: 2021-07-11 | Disposition: A | Discharge status: Home / Self Care | Payer: BC Managed Care – PPO | Source: Ambulatory Visit | Attending: General Surgery | Admitting: General Surgery

## 2021-07-11 ENCOUNTER — Other Ambulatory Visit: Payer: Self-pay | Admitting: General Surgery

## 2021-07-11 DIAGNOSIS — K8050 Calculus of bile duct without cholangitis or cholecystitis without obstruction: Secondary | ICD-10-CM | POA: Diagnosis not present

## 2021-07-11 DIAGNOSIS — K838 Other specified diseases of biliary tract: Secondary | ICD-10-CM | POA: Insufficient documentation

## 2021-07-11 DIAGNOSIS — R7989 Other specified abnormal findings of blood chemistry: Secondary | ICD-10-CM

## 2021-07-11 DIAGNOSIS — K8065 Calculus of gallbladder and bile duct with chronic cholecystitis with obstruction: Secondary | ICD-10-CM | POA: Diagnosis not present

## 2021-07-11 MED ORDER — GADOBUTROL 1 MMOL/ML IV SOLN
7.0000 mL | Freq: Once | INTRAVENOUS | Status: AC | PRN
  Administered 2021-07-11: 7 mL via INTRAVENOUS

## 2021-07-12 ENCOUNTER — Encounter: Payer: Self-pay | Admitting: Family Medicine

## 2021-07-12 ENCOUNTER — Other Ambulatory Visit: Payer: Self-pay

## 2021-07-12 ENCOUNTER — Encounter (HOSPITAL_COMMUNITY): Payer: Self-pay | Admitting: Emergency Medicine

## 2021-07-12 ENCOUNTER — Inpatient Hospital Stay (HOSPITAL_COMMUNITY)
Admission: EM | Admit: 2021-07-12 | Discharge: 2021-07-16 | DRG: 417 | Disposition: A | Discharge status: Home / Self Care | Payer: BC Managed Care – PPO | Attending: Internal Medicine | Admitting: Internal Medicine

## 2021-07-12 DIAGNOSIS — K862 Cyst of pancreas: Secondary | ICD-10-CM | POA: Diagnosis present

## 2021-07-12 DIAGNOSIS — R932 Abnormal findings on diagnostic imaging of liver and biliary tract: Secondary | ICD-10-CM | POA: Diagnosis not present

## 2021-07-12 DIAGNOSIS — K8309 Other cholangitis: Secondary | ICD-10-CM

## 2021-07-12 DIAGNOSIS — I10 Essential (primary) hypertension: Secondary | ICD-10-CM

## 2021-07-12 DIAGNOSIS — E876 Hypokalemia: Secondary | ICD-10-CM | POA: Diagnosis present

## 2021-07-12 DIAGNOSIS — Z8249 Family history of ischemic heart disease and other diseases of the circulatory system: Secondary | ICD-10-CM | POA: Diagnosis not present

## 2021-07-12 DIAGNOSIS — K8050 Calculus of bile duct without cholangitis or cholecystitis without obstruction: Secondary | ICD-10-CM | POA: Diagnosis present

## 2021-07-12 DIAGNOSIS — N390 Urinary tract infection, site not specified: Secondary | ICD-10-CM | POA: Diagnosis present

## 2021-07-12 DIAGNOSIS — K219 Gastro-esophageal reflux disease without esophagitis: Secondary | ICD-10-CM | POA: Diagnosis present

## 2021-07-12 DIAGNOSIS — Z87891 Personal history of nicotine dependence: Secondary | ICD-10-CM | POA: Diagnosis not present

## 2021-07-12 DIAGNOSIS — B964 Proteus (mirabilis) (morganii) as the cause of diseases classified elsewhere: Secondary | ICD-10-CM | POA: Diagnosis present

## 2021-07-12 DIAGNOSIS — Z885 Allergy status to narcotic agent status: Secondary | ICD-10-CM

## 2021-07-12 DIAGNOSIS — Z6828 Body mass index (BMI) 28.0-28.9, adult: Secondary | ICD-10-CM | POA: Diagnosis not present

## 2021-07-12 DIAGNOSIS — K8051 Calculus of bile duct without cholangitis or cholecystitis with obstruction: Secondary | ICD-10-CM | POA: Diagnosis not present

## 2021-07-12 DIAGNOSIS — L299 Pruritus, unspecified: Secondary | ICD-10-CM | POA: Diagnosis present

## 2021-07-12 DIAGNOSIS — R7989 Other specified abnormal findings of blood chemistry: Secondary | ICD-10-CM

## 2021-07-12 DIAGNOSIS — T361X5A Adverse effect of cephalosporins and other beta-lactam antibiotics, initial encounter: Secondary | ICD-10-CM | POA: Diagnosis present

## 2021-07-12 DIAGNOSIS — K8065 Calculus of gallbladder and bile duct with chronic cholecystitis with obstruction: Principal | ICD-10-CM | POA: Diagnosis present

## 2021-07-12 DIAGNOSIS — Z825 Family history of asthma and other chronic lower respiratory diseases: Secondary | ICD-10-CM | POA: Diagnosis not present

## 2021-07-12 DIAGNOSIS — R17 Unspecified jaundice: Secondary | ICD-10-CM | POA: Diagnosis not present

## 2021-07-12 DIAGNOSIS — K831 Obstruction of bile duct: Secondary | ICD-10-CM | POA: Diagnosis present

## 2021-07-12 DIAGNOSIS — R634 Abnormal weight loss: Secondary | ICD-10-CM | POA: Diagnosis present

## 2021-07-12 DIAGNOSIS — Z79899 Other long term (current) drug therapy: Secondary | ICD-10-CM

## 2021-07-12 DIAGNOSIS — R202 Paresthesia of skin: Secondary | ICD-10-CM | POA: Diagnosis present

## 2021-07-12 DIAGNOSIS — K5900 Constipation, unspecified: Secondary | ICD-10-CM | POA: Diagnosis present

## 2021-07-12 DIAGNOSIS — K851 Biliary acute pancreatitis without necrosis or infection: Secondary | ICD-10-CM

## 2021-07-12 DIAGNOSIS — K869 Disease of pancreas, unspecified: Secondary | ICD-10-CM | POA: Diagnosis present

## 2021-07-12 LAB — URINALYSIS, ROUTINE W REFLEX MICROSCOPIC
Glucose, UA: NEGATIVE mg/dL
Ketones, ur: NEGATIVE mg/dL
Nitrite: NEGATIVE
Protein, ur: NEGATIVE mg/dL
Specific Gravity, Urine: 1.01 (ref 1.005–1.030)
pH: 6 (ref 5.0–8.0)

## 2021-07-12 LAB — COMPREHENSIVE METABOLIC PANEL
ALT: 212 U/L — ABNORMAL HIGH (ref 0–44)
AST: 189 U/L — ABNORMAL HIGH (ref 15–41)
Albumin: 3.5 g/dL (ref 3.5–5.0)
Alkaline Phosphatase: 368 U/L — ABNORMAL HIGH (ref 38–126)
Anion gap: 5 (ref 5–15)
BUN: 13 mg/dL (ref 8–23)
CO2: 24 mmol/L (ref 22–32)
Calcium: 10.7 mg/dL — ABNORMAL HIGH (ref 8.9–10.3)
Chloride: 107 mmol/L (ref 98–111)
Creatinine, Ser: 0.75 mg/dL (ref 0.44–1.00)
GFR, Estimated: >60 mL/min (ref >60)
Glucose, Bld: 110 mg/dL — ABNORMAL HIGH (ref 70–99)
Potassium: 3.6 mmol/L (ref 3.5–5.1)
Sodium: 136 mmol/L (ref 135–145)
Total Bilirubin: 10 mg/dL — ABNORMAL HIGH (ref 0.3–1.2)
Total Protein: 7.6 g/dL (ref 6.5–8.1)

## 2021-07-12 LAB — CBC
HCT: 39.1 % (ref 36.0–46.0)
Hemoglobin: 13.1 g/dL (ref 12.0–15.0)
MCH: 31 pg (ref 26.0–34.0)
MCHC: 33.5 g/dL (ref 30.0–36.0)
MCV: 92.4 fL (ref 80.0–100.0)
Platelets: 269 10*3/uL (ref 150–400)
RBC: 4.23 MIL/uL (ref 3.87–5.11)
RDW: 13.8 % (ref 11.5–15.5)
WBC: 6.6 10*3/uL (ref 4.0–10.5)
nRBC: 0 % (ref 0.0–0.2)

## 2021-07-12 LAB — LIPASE, BLOOD: Lipase: 1931 U/L — ABNORMAL HIGH (ref 11–51)

## 2021-07-12 LAB — HIV ANTIBODY (ROUTINE TESTING W REFLEX): HIV Screen 4th Generation wRfx: NONREACTIVE

## 2021-07-12 MED ORDER — ONDANSETRON HCL 4 MG PO TABS: 4.0000 mg | ORAL_TABLET | Freq: Four times a day (QID) | ORAL | Status: DC | PRN

## 2021-07-12 MED ORDER — ACETAMINOPHEN 650 MG RE SUPP: 650.0000 mg | Freq: Four times a day (QID) | RECTAL | Status: DC | PRN

## 2021-07-12 MED ORDER — DIPHENHYDRAMINE HCL 50 MG/ML IJ SOLN: 25.0000 mg | Freq: Four times a day (QID) | INTRAMUSCULAR | Status: DC | PRN

## 2021-07-12 MED ORDER — HYDRALAZINE HCL 20 MG/ML IJ SOLN
10.0000 mg | INTRAMUSCULAR | Status: DC | PRN
Start: 2021-07-12 — End: 2021-07-16
  Administered 2021-07-15: 10 mg via INTRAVENOUS
  Filled 2021-07-12: qty 1

## 2021-07-12 MED ORDER — FENTANYL CITRATE PF 50 MCG/ML IJ SOSY
50.0000 ug | PREFILLED_SYRINGE | Freq: Once | INTRAMUSCULAR | Status: AC
  Administered 2021-07-12: 50 ug via INTRAVENOUS
  Filled 2021-07-12: qty 1

## 2021-07-12 MED ORDER — ONDANSETRON HCL 4 MG/2ML IJ SOLN
4.0000 mg | Freq: Four times a day (QID) | INTRAMUSCULAR | Status: DC | PRN
Start: 2021-07-12 — End: 2021-07-16
  Administered 2021-07-12: 4 mg via INTRAVENOUS
  Filled 2021-07-12: qty 2

## 2021-07-12 MED ORDER — DIPHENHYDRAMINE HCL 50 MG/ML IJ SOLN
INTRAMUSCULAR | Status: AC
  Administered 2021-07-12: 25 mg via INTRAVENOUS
  Filled 2021-07-12: qty 1

## 2021-07-12 MED ORDER — PANTOPRAZOLE SODIUM 40 MG IV SOLR
40.0000 mg | INTRAVENOUS | Status: DC
  Administered 2021-07-12 – 2021-07-16 (×4): 40 mg via INTRAVENOUS
  Filled 2021-07-12 (×5): qty 10

## 2021-07-12 MED ORDER — SODIUM CHLORIDE 0.9 % IV SOLN
2.0000 g | INTRAVENOUS | Status: DC
  Administered 2021-07-12: 2 g via INTRAVENOUS
  Filled 2021-07-12: qty 20

## 2021-07-12 MED ORDER — FENTANYL CITRATE PF 50 MCG/ML IJ SOSY
12.5000 ug | PREFILLED_SYRINGE | INTRAMUSCULAR | Status: DC | PRN
  Administered 2021-07-12 – 2021-07-13 (×5): 50 ug via INTRAVENOUS
  Filled 2021-07-12 (×6): qty 1

## 2021-07-12 MED ORDER — OXYCODONE HCL 5 MG PO TABS
5.0000 mg | ORAL_TABLET | ORAL | Status: DC | PRN
  Administered 2021-07-12 – 2021-07-16 (×6): 5 mg via ORAL
  Filled 2021-07-12 (×6): qty 1

## 2021-07-12 MED ORDER — LACTATED RINGERS IV SOLN: INTRAVENOUS | Status: DC

## 2021-07-12 MED ORDER — CIPROFLOXACIN IN D5W 400 MG/200ML IV SOLN
400.0000 mg | Freq: Two times a day (BID) | INTRAVENOUS | Status: AC
  Administered 2021-07-13 – 2021-07-14 (×4): 400 mg via INTRAVENOUS
  Filled 2021-07-12 (×4): qty 200

## 2021-07-12 MED ORDER — ACETAMINOPHEN 325 MG PO TABS
650.0000 mg | ORAL_TABLET | Freq: Four times a day (QID) | ORAL | Status: DC | PRN
Start: 2021-07-12 — End: 2021-07-16
  Administered 2021-07-12 – 2021-07-13 (×2): 650 mg via ORAL
  Filled 2021-07-12 (×2): qty 2

## 2021-07-12 NOTE — H&P (View-Only) (Signed)
I was present with the medical student for this service. I personally verified the history of present illness, performed the physical exam, and made the plan for this encounter. I have verified the medical student's documentation and made modifications where appropriately. I have personally documented in my own words a brief history, physical, and plan below.     Patient with gallstone pancreatitis and choledocholithiasis and jaundice. She is getting her ERCP tomorrow due to scheduling. MRCP ordered as outpatient demonstrating 1cm CBD stone. Bilirubin 10. She says she has had some nausea but started having RUQ pain last night and came to the hospital.   Can have ice for comfort. Discussed with family and team.   Curlene Labrum, MD [facility] [place], [place] [address] [phone] (office)   [facility] Consult  Reason for Consult: RUQ abdominal pain Referring Physician: Kathie Dike, MD  Chief Complaint   Abdominal Pain     HPI: Laurie Olson is a 61 y.o. female with past medical history of cholelithiasis and HTN. She saw Dr. Constance Haw outpatient on 05/24/2021 for cholelithiasis. Acute abdominal pain started last night and she drove herself to the ED. She reports that the pain is worst in the epigastric region radiating to her right back and flank. Her last meal was on Sunday and she reports that she felt pain after eating. It is associated with nausea. She denies vomiting. She received fentanyl this morning which she says has helped with pain.  Past Medical History:  Diagnosis Date   Allergic rhinitis    Anemia    Constipation    Hypertension     Past Surgical History:  Procedure Laterality Date   CESAREAN SECTION     x2    Family History  Problem Relation Age of Onset   Hypertension Mother 65   Hypertension Sister 17   Allergies Son    Eczema Son    Asthma Son     Social History   Tobacco Use    Smoking status: Former    Packs/day: 2.00    Types: Cigarettes   Smokeless tobacco: Never  Vaping Use   Vaping Use: Never used  Substance Use Topics   Alcohol use: No   Drug use: No    Medications: I have reviewed the patient's current medications. Prior to Admission:  Medications Prior to Admission  Medication Sig Dispense Refill Last Dose   amLODipine (NORVASC) 10 MG tablet Take 1 tablet by mouth once daily 30 tablet 0 07/11/2021   fluticasone (FLONASE) 50 MCG/ACT nasal spray Use 1 spray(s) in each nostril once daily 16 g 5 07/11/2021   metoprolol succinate (TOPROL-XL) 25 MG 24 hr tablet Take 1 tablet by mouth once daily 30 tablet 0 07/11/2021   montelukast (SINGULAIR) 10 MG tablet TAKE 1 TABLET BY MOUTH AT BEDTIME 30 tablet 0 07/11/2021   pantoprazole (PROTONIX) 20 MG tablet Take 1 tablet (20 mg total) by mouth daily. 30 tablet 5 07/11/2021   potassium chloride SA (KLOR-CON M) 20 MEQ tablet Take 1 tablet (20 mEq total) by mouth daily. 30 tablet 2 07/11/2021   Vitamin D, Ergocalciferol, (DRISDOL) 1.25 MG (50000 UNIT) CAPS capsule Take 1 capsule by mouth once a week 12 capsule 0 Past Week   betamethasone dipropionate 0.05 % cream Apply sparingly twice daily to hands for 1 week, then as needed (Patient not taking: Reported on 07/12/2021) 45 g 0 Not Taking   clotrimazole-betamethasone (LOTRISONE) cream APPLY TOPICALLY TWICE DAILY FOR  10 DAYS TO RASH, THEN AS NEEDED (Patient not taking: Reported on 07/12/2021) 45 g 0 Not Taking   polyethylene glycol-electrolytes (TRILYTE) 420 g solution Take 4,000 mLs by mouth as directed. (Patient not taking: Reported on 07/12/2021) 4000 mL 0 Not Taking   Scheduled:  pantoprazole (PROTONIX) IV  40 mg Intravenous Q24H   Continuous:  cefTRIAXone (ROCEPHIN)  IV 2 g (07/12/21 0836)   lactated ringers 125 mL/hr at 07/12/21 0907   VFI:EPPIRJJOACZYS **OR** acetaminophen, diphenhydrAMINE, fentaNYL (SUBLIMAZE) injection, hydrALAZINE, ondansetron **OR** ondansetron  (ZOFRAN) IV Anti-infectives (From admission, onward)    Start     Dose/Rate Route Frequency Ordered Stop   07/12/21 0830  cefTRIAXone (ROCEPHIN) 2 g in sodium chloride 0.9 % 100 mL IVPB        2 g 200 mL/hr over 30 Minutes Intravenous Every 24 hours 07/12/21 0810         Allergies  Allergen Reactions   Hydrocodone-Acetaminophen     REACTION: nausea, vomiting     ROS:  Pertinent items noted in HPI and remainder of comprehensive ROS otherwise negative.  Blood pressure (!) 150/90, pulse 70, temperature 97.8 F (36.6 C), resp. rate 18, height 5' 6"  (1.676 m), weight 79.9 kg, last menstrual period 07/09/2010, SpO2 100 %. Physical Exam Constitutional: Awake, alert, laying in bed. No acute distress. HENT: Normocephalic, atraumatic. Jaundice Eyes: Scleral icterus. EOMI. Abdomen: Pain to palpation over the epigastric region, RUQ, and the LLQ. No rebound tenderness or guarding. Soft, non-distended. MSK: Moves all extremities equally. Skin: Jaundiced. Skin warm and well perfused. No rashes or lesions noted. Neuro: AAOx3. No focal deficits on exam. Psych: Normal mood and affect.  Results: Results for orders placed or performed during the hospital encounter of 07/12/21 (from the past 48 hour(s))  Lipase, blood     Status: Abnormal   Collection Time: 07/12/21  2:46 AM  Result Value Ref Range   Lipase 2,081 (H) 11 - 51 U/L    Comment: RESULTS CONFIRMED BY MANUAL DILUTION Performed at [facility], [address] [place], [place] [address]   Comprehensive metabolic panel     Status: Abnormal   Collection Time: 07/12/21  2:46 AM  Result Value Ref Range   Sodium 136 135 - 145 mmol/L   Potassium 3.6 3.5 - 5.1 mmol/L   Chloride 107 98 - 111 mmol/L   CO2 24 22 - 32 mmol/L   Glucose, Bld 110 (H) 70 - 99 mg/dL    Comment: Glucose reference range applies only to samples taken after fasting for at least 8 hours.   BUN 13 8 - 23 mg/dL   Creatinine, Ser 0.75 0.44 - 1.00 mg/dL   Calcium 10.7  (H) 8.9 - 10.3 mg/dL   Total Protein 7.6 6.5 - 8.1 g/dL   Albumin 3.5 3.5 - 5.0 g/dL   AST 189 (H) 15 - 41 U/L   ALT 212 (H) 0 - 44 U/L   Alkaline Phosphatase 368 (H) 38 - 126 U/L   Total Bilirubin 10.0 (H) 0.3 - 1.2 mg/dL   GFR, Estimated >60 >60 mL/min    Comment: (NOTE) Calculated using the CKD-EPI Creatinine Equation (2021)    Anion gap 5 5 - 15    Comment: Performed at [facility], [address], [place], [place] [address]  CBC     Status: None   Collection Time: 07/12/21  2:46 AM  Result Value Ref Range   WBC 6.6 4.0 - 10.5 K/uL   RBC 4.23 3.87 - 5.11  MIL/uL   Hemoglobin 13.1 12.0 - 15.0 g/dL   HCT 39.1 36.0 - 46.0 %   MCV 92.4 80.0 - 100.0 fL   MCH 31.0 26.0 - 34.0 pg   MCHC 33.5 30.0 - 36.0 g/dL   RDW 13.8 11.5 - 15.5 %   Platelets 269 150 - 400 K/uL   nRBC 0.0 0.0 - 0.2 %    Comment: Performed at [facility], [address], [place], [place] [address]  Urinalysis, Routine w reflex microscopic     Status: Abnormal   Collection Time: 07/12/21  7:00 AM  Result Value Ref Range   Color, Urine AMBER (A) YELLOW    Comment: BIOCHEMICALS MAY BE AFFECTED BY COLOR   APPearance CLEAR CLEAR   Specific Gravity, Urine 1.010 1.005 - 1.030   pH 6.0 5.0 - 8.0   Glucose, UA NEGATIVE NEGATIVE mg/dL   Hgb urine dipstick MODERATE (A) NEGATIVE   Bilirubin Urine MODERATE (A) NEGATIVE   Ketones, ur NEGATIVE NEGATIVE mg/dL   Protein, ur NEGATIVE NEGATIVE mg/dL   Nitrite NEGATIVE NEGATIVE   Leukocytes,Ua SMALL (A) NEGATIVE   RBC / HPF 0-5 0 - 5 RBC/hpf   WBC, UA 21-50 0 - 5 WBC/hpf   Bacteria, UA MANY (A) NONE SEEN   Squamous Epithelial / LPF 0-5 0 - 5   Mucus PRESENT    Amorphous Crystal PRESENT     Comment: Performed at [facility], [address], [place], [place] [address]   Personally reviewed - CBD stone and dilated duct MR 3D Recon At Scanner  Result Date: 07/12/2021 CLINICAL DATA:  Elevated liver function test with nausea. EXAM: MRI ABDOMEN WITHOUT AND WITH CONTRAST  (INCLUDING MRCP) TECHNIQUE: Multiplanar multisequence MR imaging of the abdomen was performed both before and after the administration of intravenous contrast. Heavily T2-weighted images of the biliary and pancreatic ducts were obtained, and three-dimensional MRCP images were rendered by post processing. CONTRAST:  57m GADAVIST GADOBUTROL 1 MMOL/ML IV SOLN COMPARISON:  April 22, 2021 abdominal sonogram. FINDINGS: Lower chest: No effusion or consolidation, limited assessment of the lung bases on MRI. Hepatobiliary: Large biliary calculus in the distal common bile duct with associated dilation of the common bile duct and of intrahepatic biliary tree. Common bile duct is moderate to markedly dilated at 14 mm. Moderate to marked dilation of intrahepatic biliary radicals. Distended gallbladder. Sludge and numerous stones in the dependent gallbladder and in the gallbladder neck. The stone in the common bile duct measures 10 mm. No current pericholecystic stranding or fluid. The portal vein is patent.  No focal, suspicious hepatic lesion. Pancreas: Dilation of the main pancreatic duct. No peripancreatic stranding. Main pancreatic duct up to 3-4 mm in the tail. Mild pancreatic atrophy throughout the pancreas. Small cystic lesions in the pancreas (image 17/6) 1 in the neck at 8 mm, another in the tail at 8 mm. Some preservation of intrinsic T1 signal despite pancreatic atrophy. No visible lesion. Spleen:  Normal. Adrenals/Urinary Tract: Normal adrenal glands. Small cysts in the bilateral kidneys with mild cortical scarring on the LEFT. No hydronephrosis or perinephric stranding. Small cysts require no follow-up Stomach/Bowel: Gastrointestinal tract is unremarkable to the extent evaluated on this abdominal MRI. Vascular/Lymphatic: No pathologically enlarged lymph nodes identified. No abdominal aortic aneurysm demonstrated. Other:  None Musculoskeletal: No suspicious bone lesions identified. IMPRESSION: 1.  Choledocholithiasis with 1 cm gallstone in the distal common bile duct resulting in biliary duct obstruction with moderate to marked biliary dilation and associated with pancreatic duct  dilation. Biliary duct distension is increased compared to the abdominal sonogram which was performed in February. 2. Gallbladder is distended without current signs of overt inflammation. There is also abundant sludge in numerous stones in the dependent gallbladder and gallbladder neck. 3. Mild pancreatic main duct dilation could be related in part to biliary duct obstruction. Small cystic lesions in the pancreas without overt suspicious features otherwise, suggest follow-up imaging at 1 year with MRI/MRCP to track pancreatic lesions. 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 These results will be called to the ordering clinician or representative by the Radiologist Assistant, and communication documented in the PACS or [employer]. Electronically Signed   By: Zetta Bills M.D.   On: 07/12/2021 08:23   MR ABDOMEN MRCP W WO CONTAST  Result Date: 07/12/2021 CLINICAL DATA:  Elevated liver function test with nausea. EXAM: MRI ABDOMEN WITHOUT AND WITH CONTRAST (INCLUDING MRCP) TECHNIQUE: Multiplanar multisequence MR imaging of the abdomen was performed both before and after the administration of intravenous contrast. Heavily T2-weighted images of the biliary and pancreatic ducts were obtained, and three-dimensional MRCP images were rendered by post processing. CONTRAST:  46m GADAVIST GADOBUTROL 1 MMOL/ML IV SOLN COMPARISON:  April 22, 2021 abdominal sonogram. FINDINGS: Lower chest: No effusion or consolidation, limited assessment of the lung bases on MRI. Hepatobiliary: Large biliary calculus in the distal common bile duct with associated dilation of the common bile duct and of intrahepatic biliary tree. Common bile duct is moderate to markedly dilated at 14 mm. Moderate to marked dilation of intrahepatic  biliary radicals. Distended gallbladder. Sludge and numerous stones in the dependent gallbladder and in the gallbladder neck. The stone in the common bile duct measures 10 mm. No current pericholecystic stranding or fluid. The portal vein is patent.  No focal, suspicious hepatic lesion. Pancreas: Dilation of the main pancreatic duct. No peripancreatic stranding. Main pancreatic duct up to 3-4 mm in the tail. Mild pancreatic atrophy throughout the pancreas. Small cystic lesions in the pancreas (image 17/6) 1 in the neck at 8 mm, another in the tail at 8 mm. Some preservation of intrinsic T1 signal despite pancreatic atrophy. No visible lesion. Spleen:  Normal. Adrenals/Urinary Tract: Normal adrenal glands. Small cysts in the bilateral kidneys with mild cortical scarring on the LEFT. No hydronephrosis or perinephric stranding. Small cysts require no follow-up Stomach/Bowel: Gastrointestinal tract is unremarkable to the extent evaluated on this abdominal MRI. Vascular/Lymphatic: No pathologically enlarged lymph nodes identified. No abdominal aortic aneurysm demonstrated. Other:  None Musculoskeletal: No suspicious bone lesions identified. IMPRESSION: 1. Choledocholithiasis with 1 cm gallstone in the distal common bile duct resulting in biliary duct obstruction with moderate to marked biliary dilation and associated with pancreatic duct dilation. Biliary duct distension is increased compared to the abdominal sonogram which was performed in February. 2. Gallbladder is distended without current signs of overt inflammation. There is also abundant sludge in numerous stones in the dependent gallbladder and gallbladder neck. 3. Mild pancreatic main duct dilation could be related in part to biliary duct obstruction. Small cystic lesions in the pancreas without overt suspicious features otherwise, suggest follow-up imaging at 1 year with MRI/MRCP to track pancreatic lesions. These may represent small cystic pancreatic  neoplasms. These results will be called to the ordering clinician or representative by the Radiologist Assistant, and communication documented in the PACS or [employer] Electronically Signed   By: GZetta BillsM.D.   On: 07/12/2021 08:23     Assessment & Plan:  PTALENA NEIRAis  a 61 y.o. female with past medical history of cholelithiasis and HTN who presented to the ED with acute abdominal pain and was admitted for choledocholithiasis and gallstone pancreatitis. - MRCP showed choledocholithiasis with 1cm gallstone in distal CBD with resulting biliary duct dilation and pancreatic duct dilation. - Lipase elevated at 2,081 - AST, ALT, Alk Phos, and Tbili elevated - Patient afebrile with WBC wnl. Hemodynamically stable. - Diet NPO - Pain prn - IV fluids - Continue IV antibiotics - Plan for ERCP followed by laparoscopic cholecystectomy  All questions were answered to the satisfaction of the patient and family.  The risk and benefits of surgery were discussed including but not limited to bleeding, infection, and further surgery.    Nelva Nay, Medical Student 07/12/2021, 9:50 AM

## 2021-07-12 NOTE — Consult Note (Addendum)
I was present with the medical student for this service. I personally verified the history of present illness, performed the physical exam, and made the plan for this encounter. I have verified the medical student's documentation and made modifications where appropriately. I have personally documented in my own words a brief history, physical, and plan below.    ? ?Patient with gallstone pancreatitis and choledocholithiasis and jaundice. She is getting her ERCP tomorrow due to scheduling. MRCP ordered as outpatient demonstrating 1cm CBD stone. Bilirubin 10. She says she has had some nausea but started having RUQ pain last night and came to the hospital.  ? ?Can have ice for comfort. ?Discussed with family and team.  ? ?Curlene Labrum, MD ?[facility] ?[place], [place] [address] ?[phone] (office) ? ? ?[facility] Consult ? ?Reason for Consult: RUQ abdominal pain ?Referring Physician: Kathie Dike, MD ? ?Chief Complaint   ?Abdominal Pain ?  ? ? ?HPI: Laurie Olson is a 61 y.o. female with past medical history of cholelithiasis and HTN. She saw Dr. Constance Haw outpatient on 05/24/2021 for cholelithiasis. Acute abdominal pain started last night and she drove herself to the ED. She reports that the pain is worst in the epigastric region radiating to her right back and flank. Her last meal was on Sunday and she reports that she felt pain after eating. It is associated with nausea. She denies vomiting. She received fentanyl this morning which she says has helped with pain. ? ?Past Medical History:  ?Diagnosis Date  ? Allergic rhinitis   ? Anemia   ? Constipation   ? Hypertension   ? ? ?Past Surgical History:  ?Procedure Laterality Date  ? CESAREAN SECTION    ? x2  ? ? ?Family History  ?Problem Relation Age of Onset  ? Hypertension Mother 89  ? Hypertension Sister 67  ? Allergies Son   ? Eczema Son   ? Asthma Son   ? ? ?Social History  ? ?Tobacco Use  ?  Smoking status: Former  ?  Packs/day: 2.00  ?  Types: Cigarettes  ? Smokeless tobacco: Never  ?Vaping Use  ? Vaping Use: Never used  ?Substance Use Topics  ? Alcohol use: No  ? Drug use: No  ? ? ?Medications: I have reviewed the patient's current medications. ?Prior to Admission:  ?Medications Prior to Admission  ?Medication Sig Dispense Refill Last Dose  ? amLODipine (NORVASC) 10 MG tablet Take 1 tablet by mouth once daily 30 tablet 0 07/11/2021  ? fluticasone (FLONASE) 50 MCG/ACT nasal spray Use 1 spray(s) in each nostril once daily 16 g 5 07/11/2021  ? metoprolol succinate (TOPROL-XL) 25 MG 24 hr tablet Take 1 tablet by mouth once daily 30 tablet 0 07/11/2021  ? montelukast (SINGULAIR) 10 MG tablet TAKE 1 TABLET BY MOUTH AT BEDTIME 30 tablet 0 07/11/2021  ? pantoprazole (PROTONIX) 20 MG tablet Take 1 tablet (20 mg total) by mouth daily. 30 tablet 5 07/11/2021  ? potassium chloride SA (KLOR-CON M) 20 MEQ tablet Take 1 tablet (20 mEq total) by mouth daily. 30 tablet 2 07/11/2021  ? Vitamin D, Ergocalciferol, (DRISDOL) 1.25 MG (50000 UNIT) CAPS capsule Take 1 capsule by mouth once a week 12 capsule 0 Past Week  ? betamethasone dipropionate 0.05 % cream Apply sparingly twice daily to hands for 1 week, then as needed (Patient not taking: Reported on 07/12/2021) 45 g 0 Not Taking  ? clotrimazole-betamethasone (LOTRISONE) cream APPLY TOPICALLY TWICE DAILY FOR  10 DAYS TO RASH, THEN AS NEEDED (Patient not taking: Reported on 07/12/2021) 45 g 0 Not Taking  ? polyethylene glycol-electrolytes (TRILYTE) 420 g solution Take 4,000 mLs by mouth as directed. (Patient not taking: Reported on 07/12/2021) 4000 mL 0 Not Taking  ? ?Scheduled: ? pantoprazole (PROTONIX) IV  40 mg Intravenous Q24H  ? ?Continuous: ? cefTRIAXone (ROCEPHIN)  IV 2 g (07/12/21 0836)  ? lactated ringers 125 mL/hr at 07/12/21 [id number]  ? ?[id number] **OR** acetaminophen, diphenhydrAMINE, fentaNYL (SUBLIMAZE) injection, hydrALAZINE, ondansetron **OR** ondansetron  (ZOFRAN) IV ?Anti-infectives (From admission, onward)  ? ? Start     Dose/Rate Route Frequency Ordered Stop  ? 07/12/21 0830  cefTRIAXone (ROCEPHIN) 2 g in sodium chloride 0.9 % 100 mL IVPB       ? 2 g ?200 mL/hr over 30 Minutes Intravenous Every 24 hours 07/12/21 0810    ? ?  ? ? ?Allergies  ?Allergen Reactions  ? Hydrocodone-Acetaminophen   ?  REACTION: nausea, vomiting  ? ? ? ?ROS:  ?Pertinent items noted in HPI and remainder of comprehensive ROS otherwise negative. ? ?Blood pressure (!) 150/90, pulse 70, temperature 97.8 ?F (36.6 ?C), resp. rate 18, height 5\' 6"  (1.676 m), weight 79.9 kg, last menstrual period 07/09/2010, SpO2 100 %. ?Physical Exam ?Constitutional: Awake, alert, laying in bed. No acute distress. ?HENT: Normocephalic, atraumatic. Jaundice ?Eyes: Scleral icterus. EOMI. ?Abdomen: Pain to palpation over the epigastric region, RUQ, and the LLQ. No rebound tenderness or guarding. Soft, non-distended. ?MSK: Moves all extremities equally. ?Skin: Jaundiced. Skin warm and well perfused. No rashes or lesions noted. ?Neuro: AAOx3. No focal deficits on exam. ?Psych: Normal mood and affect. ? ?Results: ?Results for orders placed or performed during the hospital encounter of 07/12/21 (from the past 48 hour(s))  ?Lipase, blood     Status: Abnormal  ? Collection Time: 07/12/21  2:46 AM  ?Result Value Ref Range  ? Lipase 2,081 (H) 11 - 51 U/L  ?  Comment: RESULTS CONFIRMED BY MANUAL DILUTION ?Performed at [facility], [address] [place], [place] [address] ?  ?Comprehensive metabolic panel     Status: Abnormal  ? Collection Time: 07/12/21  2:46 AM  ?Result Value Ref Range  ? Sodium 136 135 - 145 mmol/L  ? Potassium 3.6 3.5 - 5.1 mmol/L  ? Chloride 107 98 - 111 mmol/L  ? CO2 24 22 - 32 mmol/L  ? Glucose, Bld 110 (H) 70 - 99 mg/dL  ?  Comment: Glucose reference range applies only to samples taken after fasting for at least 8 hours.  ? BUN 13 8 - 23 mg/dL  ? Creatinine, Ser 0.75 0.44 - 1.00 mg/dL  ? Calcium 10.7  (H) 8.9 - 10.3 mg/dL  ? Total Protein 7.6 6.5 - 8.1 g/dL  ? Albumin 3.5 3.5 - 5.0 g/dL  ? AST 189 (H) 15 - 41 U/L  ? ALT 212 (H) 0 - 44 U/L  ? Alkaline Phosphatase 368 (H) 38 - 126 U/L  ? Total Bilirubin 10.0 (H) 0.3 - 1.2 mg/dL  ? GFR, Estimated >60 >60 mL/min  ?  Comment: (NOTE) ?Calculated using the CKD-EPI Creatinine Equation (2021) ?  ? Anion gap 5 5 - 15  ?  Comment: Performed at [facility], [address], [place], [place] [address]  ?CBC     Status: None  ? Collection Time: 07/12/21  2:46 AM  ?Result Value Ref Range  ? WBC 6.6 4.0 - 10.5 K/uL  ? RBC 4.23 3.87 - 5.11  MIL/uL  ? Hemoglobin 13.1 12.0 - 15.0 g/dL  ? HCT 39.1 36.0 - 46.0 %  ? MCV 92.4 80.0 - 100.0 fL  ? MCH 31.0 26.0 - 34.0 pg  ? MCHC 33.5 30.0 - 36.0 g/dL  ? RDW 13.8 11.5 - 15.5 %  ? Platelets 269 150 - 400 K/uL  ? nRBC 0.0 0.0 - 0.2 %  ?  Comment: Performed at [facility], [address] [place], [place] [address]  ?Urinalysis, Routine w reflex microscopic     Status: Abnormal  ? Collection Time: 07/12/21  7:00 AM  ?Result Value Ref Range  ? Color, Urine AMBER (A) YELLOW  ?  Comment: BIOCHEMICALS MAY BE AFFECTED BY COLOR  ? APPearance CLEAR CLEAR  ? Specific Gravity, Urine 1.010 1.005 - 1.030  ? pH 6.0 5.0 - 8.0  ? Glucose, UA NEGATIVE NEGATIVE mg/dL  ? Hgb urine dipstick MODERATE (A) NEGATIVE  ? Bilirubin Urine MODERATE (A) NEGATIVE  ? Ketones, ur NEGATIVE NEGATIVE mg/dL  ? Protein, ur NEGATIVE NEGATIVE mg/dL  ? Nitrite NEGATIVE NEGATIVE  ? Leukocytes,Ua SMALL (A) NEGATIVE  ? RBC / HPF 0-5 0 - 5 RBC/hpf  ? WBC, UA 21-50 0 - 5 WBC/hpf  ? Bacteria, UA MANY (A) NONE SEEN  ? Squamous Epithelial / LPF 0-5 0 - 5  ? Mucus PRESENT   ? Amorphous Crystal PRESENT   ?  Comment: Performed at [facility], [address] [place], [place] [address]  ? ?Personally reviewed - CBD stone and dilated duct ?MR 3D Recon At Scanner ? ?Result Date: 07/12/2021 ?CLINICAL DATA:  Elevated liver function test with nausea. EXAM: MRI ABDOMEN WITHOUT AND WITH CONTRAST  (INCLUDING MRCP) TECHNIQUE: Multiplanar multisequence MR imaging of the abdomen was performed both before and after the administration of intravenous contrast. Heavily T2-weighted images of the biliary and panc

## 2021-07-12 NOTE — TOC Progression Note (Signed)
Transition of Care (TOC) - Progression Note  ? ? ?Patient Details  ?Name: KEELIA GRAYBILL ?MRN: [id number] ?Date of Birth: September 21, 1960 ? ?Transition of Care (TOC) CM/SW Contact  ?Karn Cassis, LCSW ?Phone Number: ?07/12/2021, 11:01 AM ? ?Clinical Narrative:    ?Transition of Care (TOC) Screening Note ? ? ?Patient Details  ?Name: KATRIN GRABEL ?Date of Birth: October 02, 1960 ? ? ?Transition of Care (TOC) CM/SW Contact:    ?Karn Cassis, LCSW ?Phone Number: ?07/12/2021, 11:01 AM ? ? ? ?Transition of Care Department [facility]) has reviewed patient and no TOC needs have been identified at this time. We will continue to monitor patient advancement through interdisciplinary progression rounds. If new patient transition needs arise, please place a TOC consult. ?  ? ? ? ?  ?  ? ?Expected Discharge Plan and Services ?  ?  ?  ?  ?  ?                ?  ?  ?  ?  ?  ?  ?  ?  ?  ?  ? ? ?Social Determinants of Health (SDOH) Interventions ?  ? ?Readmission Risk Interventions ?   ? View : No data to display.  ?  ?  ?  ? ? ?

## 2021-07-12 NOTE — ED Provider Notes (Signed)
?[place] ?Provider Note ? ? ?CSN: [id number] ?Arrival date & time: 07/12/21  0132 ? ?  ? ?History ? ?Chief Complaint  ?Patient presents with  ? Abdominal Pain  ? ? ?Laurie Olson is a 61 y.o. female. ? ? ?Abdominal Pain ?Associated symptoms: nausea   ?Associated symptoms: no chest pain, no fever, no shortness of breath and no vomiting   ?Patient reports she has been having intermittent abdominal pain for quite some time.  She also reports waves of nausea without vomiting.  Denies any significant change in her bowel movements.  No chest pain or shortness of breath.  Over the past day she has had worsening abdominal pain.  Her sister has  also noted that her eyes appear yellow. ?She denies any alcohol abuse.  No Tylenol overuse. ?She reports she just had an MRI of her abdomen yesterday.  She has been seen by her PCP, gastroenterology and general surgery ?  ? ?Home Medications ?Prior to Admission medications   ?Medication Sig Start Date End Date Taking? Authorizing Provider  ?amLODipine (NORVASC) 10 MG tablet Take 1 tablet by mouth once daily 06/06/21   Fayrene Helper, MD  ?betamethasone dipropionate 0.05 % cream Apply sparingly twice daily to hands for 1 week, then as needed 12/11/18   Fayrene Helper, MD  ?clotrimazole-betamethasone (LOTRISONE) cream APPLY TOPICALLY TWICE DAILY FOR 10 DAYS TO RASH, THEN AS NEEDED 01/26/20   Fayrene Helper, MD  ?fluticasone [facility]) 50 MCG/ACT nasal spray Use 1 spray(s) in each nostril once daily 08/02/20   Fayrene Helper, MD  ?metoprolol succinate (TOPROL-XL) 25 MG 24 hr tablet Take 1 tablet by mouth once daily 06/06/21   Fayrene Helper, MD  ?montelukast (SINGULAIR) 10 MG tablet TAKE 1 TABLET BY MOUTH AT BEDTIME 06/06/21   Fayrene Helper, MD  ?pantoprazole (PROTONIX) 20 MG tablet Take 1 tablet (20 mg total) by mouth daily. 05/05/21   Fayrene Helper, MD  ?polyethylene glycol-electrolytes (TRILYTE) 420 g solution Take 4,000 mLs by  mouth as directed. 06/16/21   Harvel Quale, MD  ?potassium chloride SA (KLOR-CON M) 20 MEQ tablet Take 1 tablet (20 mEq total) by mouth daily. 05/06/21   Fayrene Helper, MD  ?Vitamin D, Ergocalciferol, (DRISDOL) 1.25 MG (50000 UNIT) CAPS capsule Take 1 capsule by mouth once a week 04/29/21   Fayrene Helper, MD  ?   ? ?Allergies    ?Hydrocodone-acetaminophen   ? ?Review of Systems   ?Review of Systems  ?Constitutional:  Negative for fever.  ?Respiratory:  Negative for shortness of breath.   ?Cardiovascular:  Negative for chest pain.  ?Gastrointestinal:  Positive for abdominal pain and nausea. Negative for blood in stool and vomiting.  ? ?Physical Exam ?Updated Vital Signs ?BP (!) 158/87   Pulse 80   Temp 97.7 ?F (36.5 ?C) (Oral)   Resp 17   Ht 1.676 m (5\' 6" )   Wt 85.3 kg   LMP 07/09/2010   SpO2 99%   BMI 30.34 kg/m?  ?Physical Exam ?CONSTITUTIONAL: Well developed/well nourished ?HEAD: Normocephalic/atraumatic ?EYES: EOMI/PERRL, scleral icterus noted ?ENMT: Mucous membranes moist ?NECK: supple no meningeal signs ?CV: S1/S2 noted, no murmurs/rubs/gallops noted ?LUNGS: Lungs are clear to auscultation bilaterally, no apparent distress ?ABDOMEN: soft, mild epigastric tenderness noted, no rebound or guarding, bowel sounds noted throughout abdomen ?GU:no cva tenderness ?NEURO: Pt is awake/alert/appropriate, moves all extremitiesx4.  No facial droop.   ?EXTREMITIES: pulses normal/equal, full ROM ?SKIN: warm, color normal ?  PSYCH: no abnormalities of mood noted, alert and oriented to situation ? ?ED Results / Procedures / Treatments   ?Labs ?(all labs ordered are listed, but only abnormal results are displayed) ?Labs Reviewed  ?LIPASE, BLOOD - Abnormal; Notable for the following components:  ?    Result Value  ? Lipase 2,081 (*)   ? All other components within normal limits  ?COMPREHENSIVE METABOLIC PANEL - Abnormal; Notable for the following components:  ? Glucose, Bld 110 (*)   ? Calcium 10.7 (*)    ? AST 189 (*)   ? ALT 212 (*)   ? Alkaline Phosphatase 368 (*)   ? Total Bilirubin 10.0 (*)   ? All other components within normal limits  ?CBC  ?URINALYSIS, ROUTINE W REFLEX MICROSCOPIC  ? ? ?EKG ?None ? ?Radiology ?No results found. ? ?Procedures ?Procedures  ? ? ?Medications Ordered in ED ?Medications  ?fentaNYL (SUBLIMAZE) injection 50 mcg (50 mcg Intravenous Given 07/12/21 0627)  ? ? ?ED Course/ Medical Decision Making/ A&P ?Clinical Course as of 07/12/21 0644  ?Tue Jul 12, 2021  ?0559 Lipase(!): 2,081 ?pancreatitis [DW]  ?0559 Total Bilirubin(!): 10.0 ?hyperbilirubinemia [DW]  ?0608 Discussed the case with Dr. Constance Haw with general surgery.  Plan to admit to the hospitalist, will also need GI consultation, potential ERCP [DW]  ?[phone] Patient stable.  Discussed with Dr. Sidney Ace for admission.  Patient will need GI consultation after admission and after MRI is resulted [DW]  ?[phone] Patient without fever and no leukocytosis, low suspicion for cholangitis [DW]  ?  ?Clinical Course User Index ?[DW] Ripley Fraise, MD  ? ?                        ?Medical Decision Making ?Amount and/or Complexity of Data Reviewed ?Labs: ordered. Decision-making details documented in ED Course. ? ?Risk ?Prescription drug management. ?Decision regarding hospitalization. ? ? ?This patient presents to the ED for concern of abdominal pain, this involves an extensive number of treatment options, and is a complaint that carries with it a high risk of complications and morbidity.  The differential diagnosis includes but is not limited to pancreatitis, cholecystitis, cholelithiasis, bowel obstruction, pancreatic cancer ? ?Comorbidities that complicate the patient evaluation: ?Patient?s presentation is complicated by their history of hypertension ? ?Additional history obtained: ?Additional history obtained from family ?Records reviewed  outpatient records ? ?Lab Tests: ?I Ordered, and personally interpreted labs.  The pertinent results include:  Hyperbilirubinemia and pancreatitis ? ? ? ?Medicines ordered and prescription drug management: ?I ordered medication including fentanyl for pain ?Reevaluation of the patient after these medicines showed that the patient    improved ? ? ?Consultations Obtained: ?I requested consultation with the admitting physician Dr. Sidney Ace , and discussed  findings as well as pertinent plan - they recommend: Admission ? ?Reevaluation: ?After the interventions noted above, I reevaluated the patient and found that they have :improved ? ?Complexity of problems addressed: ?Patient?s presentation is most consistent with  acute presentation with potential threat to life or bodily function ? ?Disposition: ?After consideration of the diagnostic results and the patient?s response to treatment,  ?I feel that the patent would benefit from admission   .  ? ? ? ? ? ? ? ? ? ?Final Clinical Impression(s) / ED Diagnoses ?Final diagnoses:  ?Acute biliary pancreatitis, unspecified complication status  ?Biliary colic  ?Gallstone pancreatitis  ? ? ?Rx / DC Orders ?ED Discharge Orders   ? ? None  ? ?  ? ? ?  ?  Ripley Fraise, MD ?07/12/21 [id number] ? ?

## 2021-07-12 NOTE — ED Notes (Signed)
After medications pt started to complaining about itching all over her body and tingling in nose and lips. Pt denies feeling like her throat is closing or short of breath, provider paged for orders  ?

## 2021-07-12 NOTE — Consult Note (Signed)
@LOGO @ ? ? ?Referring Provider: Dr. Roderic Palau ?Primary Care Physician:  Fayrene Helper, MD ?Primary Gastroenterologist:  Dr. Jenetta Downer ? ?Date of Admission: 07/12/21 ?Date of Consultation: 07/12/21 ? ?Reason for Consultation:  Elevated LFTs ? ?HPI:  ?Laurie Olson is a 61 y.o. year old female with medical history significant of cholelithiasis, hypertension, who presented to the emergency room early this morning with chief complaint of right upper quadrant abdominal pain for the last month with acute worsening and also noticed jaundice yesterday.   ? ?Notably, she has also been found to have elevated liver enzymes since February of this year with a dilated CBD on ultrasound without obvious choledocholithiasis, following with Dr. Jenetta Downer and Dr. Constance Haw outpatient.  Laboratory work-up thus far has been nonspecific with positive ANA, 1-40 titer, IgG, ASMA, AMA within normal limits.  Ferritin and iron saturation elevated.  Dr. Constance Haw had ordered an MRCP which was completed yesterday revealing choledocholithiasis with 1 cm gallstone in the distal common bile duct resulting in biliary duct obstruction with moderate to marked biliary dilation and associated pancreatic duct dilation.  Gallbladder distended without overt inflammation, numerous stones and abundant sludge in the gallbladder and gallbladder neck.  Also with a small cystic lesion in the pancreas without overt suspicious features with recommendations for follow-up MRI/MRCP in 1 year. ? ?ED course: ?Vital signs stable and within normal limits. ?CBC within normal limits. ?CMP remarkable for AST 189, ALT 212, alk phos 368, total bilirubin 10.0. ?Lipase 2081. ? ?She was started on IV fluids, ceftriaxone, and kept n.p.o. ? ? ?Consult:  ?Patient reports has been dealing with intermittent epigastric pain and nausea without vomiting since February of this year.  Associated decreased p.o. intake and about 5 pound weight loss, but no specific worsening epigastric pain  with meals.  She underwent MRI yesterday which she states went well, but she developed acute worsening of epigastric pain radiating to right upper quadrant and her right shoulder blade last night around midnight.  Associated nausea without vomiting.  Denies fever.  Clinically, she is feeling improved since receiving pain medications.  Denies abdominal pain at this time or nausea. ? ?She does have history of chronic GERD which has been well controlled on pantoprazole daily outpatient.  Denies dysphagia.  She has some chronic issues with mild constipation, bowel movements about 3 times a week.  Last bowel movement was this morning.  Denies BRBPR or melena. ? ?Denies alcohol use, illicit drug use, tobacco use. ? ?No prior EGD or colonoscopy.  She is scheduled for colonoscopy on 5/31 with Dr. Jenetta Downer. ? ?Past Medical History:  ?Diagnosis Date  ? Allergic rhinitis   ? Anemia   ? Constipation   ? Hypertension   ? ? ?Past Surgical History:  ?Procedure Laterality Date  ? CESAREAN SECTION    ? x2  ? ? ?Prior to Admission medications   ?Medication Sig Start Date End Date Taking? Authorizing Provider  ?amLODipine (NORVASC) 10 MG tablet Take 1 tablet by mouth once daily 06/06/21  Yes Fayrene Helper, MD  ?fluticasone [facility]) 50 MCG/ACT nasal spray Use 1 spray(s) in each nostril once daily 08/02/20  Yes Fayrene Helper, MD  ?metoprolol succinate (TOPROL-XL) 25 MG 24 hr tablet Take 1 tablet by mouth once daily 06/06/21  Yes Fayrene Helper, MD  ?montelukast (SINGULAIR) 10 MG tablet TAKE 1 TABLET BY MOUTH AT BEDTIME 06/06/21  Yes Fayrene Helper, MD  ?pantoprazole (PROTONIX) 20 MG tablet Take 1 tablet (20 mg total) by mouth  daily. 05/05/21  Yes Fayrene Helper, MD  ?potassium chloride SA (KLOR-CON M) 20 MEQ tablet Take 1 tablet (20 mEq total) by mouth daily. 05/06/21  Yes Fayrene Helper, MD  ?Vitamin D, Ergocalciferol, (DRISDOL) 1.25 MG (50000 UNIT) CAPS capsule Take 1 capsule by mouth once a week 04/29/21  Yes  Fayrene Helper, MD  ?betamethasone dipropionate 0.05 % cream Apply sparingly twice daily to hands for 1 week, then as needed ?Patient not taking: Reported on 07/12/2021 12/11/18   Fayrene Helper, MD  ?clotrimazole-betamethasone (LOTRISONE) cream APPLY TOPICALLY TWICE DAILY FOR 10 DAYS TO RASH, THEN AS NEEDED ?Patient not taking: Reported on 07/12/2021 01/26/20   Fayrene Helper, MD  ?polyethylene glycol-electrolytes (TRILYTE) 420 g solution Take 4,000 mLs by mouth as directed. ?Patient not taking: Reported on 07/12/2021 06/16/21   Harvel Quale, MD  ? ? ?Current Facility-Administered Medications  ?Medication Dose Route Frequency Provider Last Rate Last Admin  ? acetaminophen (TYLENOL) tablet 650 mg  650 mg Oral Q6H PRN Kathie Dike, MD      ? Or  ? acetaminophen (TYLENOL) suppository 650 mg  650 mg Rectal Q6H PRN Kathie Dike, MD      ? cefTRIAXone (ROCEPHIN) 2 g in sodium chloride 0.9 % 100 mL IVPB  2 g Intravenous Q24H Kathie Dike, MD 200 mL/hr at 07/12/21 0836 2 g at 07/12/21 0836  ? diphenhydrAMINE (BENADRYL) injection 25 mg  25 mg Intravenous Q6H PRN Kathie Dike, MD   25 mg at 07/12/21 0908  ? fentaNYL (SUBLIMAZE) injection 12.5-50 mcg  12.5-50 mcg Intravenous Q2H PRN Kathie Dike, MD   50 mcg at 07/12/21 0347  ? hydrALAZINE (APRESOLINE) injection 10 mg  10 mg Intravenous Q4H PRN Kathie Dike, MD      ? lactated ringers infusion   Intravenous Continuous Kathie Dike, MD 125 mL/hr at 07/12/21 0907 New Bag at 07/12/21 0907  ? ondansetron (ZOFRAN) tablet 4 mg  4 mg Oral Q6H PRN Kathie Dike, MD      ? Or  ? ondansetron (ZOFRAN) injection 4 mg  4 mg Intravenous Q6H PRN Kathie Dike, MD   4 mg at 07/12/21 0835  ? pantoprazole (PROTONIX) injection 40 mg  40 mg Intravenous Q24H Kathie Dike, MD   40 mg at 07/12/21 0836  ? ? ?Allergies as of 07/12/2021 - Review Complete 07/12/2021  ?Allergen Reaction Noted  ? Hydrocodone-acetaminophen    ? ? ?Family History   ?Problem Relation Age of Onset  ? Hypertension Mother 22  ? Hypertension Sister 38  ? Allergies Son   ? Eczema Son   ? Asthma Son   ? ? ?Social History  ? ?Socioeconomic History  ? Marital status: Divorced  ?  Spouse name: Not on file  ? Number of children: Not on file  ? Years of education: Not on file  ? Highest education level: Not on file  ?Occupational History  ? Not on file  ?Tobacco Use  ? Smoking status: Former  ?  Packs/day: 2.00  ?  Types: Cigarettes  ? Smokeless tobacco: Never  ?Vaping Use  ? Vaping Use: Never used  ?Substance and Sexual Activity  ? Alcohol use: No  ? Drug use: No  ? Sexual activity: Not on file  ?Other Topics Concern  ? Not on file  ?Social History Narrative  ? Not on file  ? ?Social Determinants of Health  ? ?Financial Resource Strain: Not on file  ?Food Insecurity: Not on file  ?Transportation Needs:  Not on file  ?Physical Activity: Not on file  ?Stress: Not on file  ?Social Connections: Not on file  ?Intimate Partner Violence: Not on file  ? ? ?Review of Systems: ?Gen: Denies fever, chills, cold or flulike symptoms, presyncope, syncope.  Notes tingling of her lips and nose. ?CV: Denies chest pain, heart palpitations. ?Resp: Denies shortness of breath or cough. ?GI: See HPI ?GU : Denies urinary burning, urinary frequency, urinary incontinence.  ?MS: Denies joint pain. ?Derm: Admits to generalized itching. ?Psych: Denies depression, anxiety. ?Heme: See HPI ? ?Physical Exam: ?Vital signs in last 24 hours: ?Temp:  [97.7 ?F (36.5 ?C)-97.8 ?F (36.6 ?C)] 97.8 ?F (36.6 ?C) (05/16 0935) ?Pulse Rate:  [63-80] 70 (05/16 0935) ?Resp:  [16-18] 18 (05/16 0935) ?BP: (129-158)/(87-94) 150/90 (05/16 0935) ?SpO2:  [97 %-100 %] 100 % (05/16 0935) ?Weight:  [79.9 kg-85.3 kg] 79.9 kg (05/16 0938) ?  ?General:   Alert,  Well-developed, well-nourished, pleasant and cooperative in NAD ?Head:  Normocephalic and atraumatic. ?Eyes:  + Scleral icterus ?Ears:  Normal auditory acuity. ?Nose:  No deformity,  discharge,  or lesions. ?Mouth:  No deformity or lesions, dentition normal. ?Lungs:  Clear throughout to auscultation.   No wheezes, crackles, or rhonchi. No acute distress. ?Heart:  Regular rate and rhythm; no murm

## 2021-07-12 NOTE — ED Triage Notes (Signed)
Pt c/o abd pain x 3 weeks. Pt states she is having a flare-up of gallstone pain.  ?

## 2021-07-12 NOTE — H&P (Signed)
?History and Physical  ? ? ?Stevan Bornansy D Frisch [id number] DOB: 12/04/1960 DOA: 07/12/2021 ? ?PCP: Kerri PerchesSimpson, Margaret E, MD  ?Patient coming from: Home ? ?I have personally briefly reviewed patient's old medical records in [facility] ? ?Chief Complaint: Abdominal pain ? ?HPI: Negin D Myrtis HoppingMcNair is a 61 y.o. female with medical history significant of cholelithiasis, hypertension who reports having right upper quadrant abdominal pain for the past month.  She has been seeing GI and general surgery as an outpatient.  Labs indicate that she is noted to have elevated liver enzymes since March of this year.  She reports that her abdominal pain got acutely worse yesterday.  She is unaware of having any fever.  She did not have any vomiting.  Bowel movements have otherwise been normal.  Her p.o. intake has been poor lately.  She is feeling generally weak.  Her sister noted that she acutely became jaundiced yesterday.  She denies any dysuria, but did complain of back pain.  She had an outpatient MRCP scheduled yesterday regarding further evaluation of her elevated LFTs.  Report on MRI currently pending ? ?ED Course: On arrival to the emergency room, patient noted to have elevated bilirubin of 10.  Transaminases elevated near baseline at 189/212 for AST/ALT respectively.  Alkaline phosphatase elevated at 368.  WBC count was normal.  She was afebrile and vitals were otherwise stable.  UA shows 21-50 WBCs and many bacteria. ? ?Review of Systems: As per HPI otherwise 10 point review of systems negative.  ? ? ?Past Medical History:  ?Diagnosis Date  ? Allergic rhinitis   ? Anemia   ? Constipation   ? Hypertension   ? ? ?Past Surgical History:  ?Procedure Laterality Date  ? CESAREAN SECTION    ? x2  ? ? ?Social History: ? reports that she has quit smoking. Her smoking use included cigarettes. She smoked an average of 2 packs per day. She has never used smokeless tobacco. She reports that she does not drink alcohol and does not use  drugs. ? ?Allergies  ?Allergen Reactions  ? Hydrocodone-Acetaminophen   ?  REACTION: nausea, vomiting  ? ? ?Family History  ?Problem Relation Age of Onset  ? Hypertension Mother 6070  ? Hypertension Sister 7940  ? Allergies Son   ? Eczema Son   ? Asthma Son   ? ? ? ?Prior to Admission medications   ?Medication Sig Start Date End Date Taking? Authorizing Provider  ?amLODipine (NORVASC) 10 MG tablet Take 1 tablet by mouth once daily 06/06/21  Yes Kerri PerchesSimpson, Margaret E, MD  ?fluticasone [facility](FLONASE) 50 MCG/ACT nasal spray Use 1 spray(s) in each nostril once daily 08/02/20  Yes Kerri PerchesSimpson, Margaret E, MD  ?metoprolol succinate (TOPROL-XL) 25 MG 24 hr tablet Take 1 tablet by mouth once daily 06/06/21  Yes Kerri PerchesSimpson, Margaret E, MD  ?montelukast (SINGULAIR) 10 MG tablet TAKE 1 TABLET BY MOUTH AT BEDTIME 06/06/21  Yes Kerri PerchesSimpson, Margaret E, MD  ?pantoprazole (PROTONIX) 20 MG tablet Take 1 tablet (20 mg total) by mouth daily. 05/05/21  Yes Kerri PerchesSimpson, Margaret E, MD  ?potassium chloride SA (KLOR-CON M) 20 MEQ tablet Take 1 tablet (20 mEq total) by mouth daily. 05/06/21  Yes Kerri PerchesSimpson, Margaret E, MD  ?Vitamin D, Ergocalciferol, (DRISDOL) 1.25 MG (50000 UNIT) CAPS capsule Take 1 capsule by mouth once a week 04/29/21  Yes Kerri PerchesSimpson, Margaret E, MD  ?betamethasone dipropionate 0.05 % cream Apply sparingly twice daily to hands for 1 week, then as needed ?Patient not taking:  Reported on 07/12/2021 12/11/18   Kerri Perches, MD  ?clotrimazole-betamethasone (LOTRISONE) cream APPLY TOPICALLY TWICE DAILY FOR 10 DAYS TO RASH, THEN AS NEEDED ?Patient not taking: Reported on 07/12/2021 01/26/20   Kerri Perches, MD  ?polyethylene glycol-electrolytes (TRILYTE) 420 g solution Take 4,000 mLs by mouth as directed. ?Patient not taking: Reported on 07/12/2021 06/16/21   Dolores Frame, MD  ? ? ?Physical Exam: ?Vitals:  ? 07/12/21 0437 07/12/21 0500 07/12/21 0530 07/12/21 0600  ?BP: (!) 157/94 (!) 150/94 (!) 158/87 (!) 149/90  ?Pulse: 63  80 67  ?Resp: 16   17 18   ?Temp:      ?TempSrc:      ?SpO2: 100%  99% 100%  ?Weight:      ?Height:      ? ? ?Constitutional: NAD, calm, comfortable,  ?Eyes: PERRL, noted to have scleral icterus ?ENMT: Mucous membranes are moist. Posterior pharynx clear of any exudate or lesions.Normal dentition.  ?Neck: normal, supple, no masses, no thyromegaly ?Respiratory: clear to auscultation bilaterally, no wheezing, no crackles. Normal respiratory effort. No accessory muscle use.  ?Cardiovascular: Regular rate and rhythm, no murmurs / rubs / gallops. No extremity edema. 2+ pedal pulses. No carotid bruits.  ?Abdomen: She is tender in right upper quadrant and epigastrium, abdomen is otherwise soft, no masses palpated. No hepatosplenomegaly. Bowel sounds positive.  ?Musculoskeletal: no clubbing / cyanosis. No joint deformity upper and lower extremities. Good ROM, no contractures. Normal muscle tone.  ?Skin: She is clearly jaundiced ?Neurologic: CN 2-12 grossly intact. Sensation intact, DTR normal. Strength 5/5 in all 4.  ?Psychiatric: Normal judgment and insight. Alert and oriented x 3. Normal mood.  ? ? ?Labs on Admission: I have personally reviewed following labs and imaging studies ? ?CBC: ?Recent Labs  ?Lab 07/12/21 ?0246  ?WBC 6.6  ?HGB 13.1  ?HCT 39.1  ?MCV 92.4  ?PLT 269  ? ?Basic Metabolic Panel: ?Recent Labs  ?Lab 07/12/21 ?0246  ?NA 136  ?K 3.6  ?CL 107  ?CO2 24  ?GLUCOSE 110*  ?BUN 13  ?CREATININE 0.75  ?CALCIUM 10.7*  ? ?GFR: ?Estimated Creatinine Clearance: 81.3 mL/min (by C-G formula based on SCr of 0.75 mg/dL). ?Liver Function Tests: ?Recent Labs  ?Lab 07/12/21 ?0246  ?AST 189*  ?ALT 212*  ?ALKPHOS 368*  ?BILITOT 10.0*  ?PROT 7.6  ?ALBUMIN 3.5  ? ?Recent Labs  ?Lab 07/12/21 ?0246  ?LIPASE 2,081*  ? ?No results for input(s): AMMONIA in the last 168 hours. ?Coagulation Profile: ?No results for input(s): INR, PROTIME in the last 168 hours. ?Cardiac Enzymes: ?No results for input(s): CKTOTAL, CKMB, CKMBINDEX, TROPONINI in the last 168  hours. ?BNP (last 3 results) ?No results for input(s): PROBNP in the last 8760 hours. ?HbA1C: ?No results for input(s): HGBA1C in the last 72 hours. ?CBG: ?No results for input(s): GLUCAP in the last 168 hours. ?Lipid Profile: ?No results for input(s): CHOL, HDL, LDLCALC, TRIG, CHOLHDL, LDLDIRECT in the last 72 hours. ?Thyroid Function Tests: ?No results for input(s): TSH, T4TOTAL, FREET4, T3FREE, THYROIDAB in the last 72 hours. ?Anemia Panel: ?No results for input(s): VITAMINB12, FOLATE, FERRITIN, TIBC, IRON, RETICCTPCT in the last 72 hours. ?Urine analysis: ?   ?Component Value Date/Time  ? COLORURINE AMBER (A) 07/12/2021 0700  ? APPEARANCEUR CLEAR 07/12/2021 0700  ? LABSPEC 1.010 07/12/2021 0700  ? PHURINE 6.0 07/12/2021 0700  ? GLUCOSEU NEGATIVE 07/12/2021 0700  ? HGBUR MODERATE (A) 07/12/2021 0700  ? BILIRUBINUR MODERATE (A) 07/12/2021 0700  ? BILIRUBINUR negative 08/28/2019 1122  ?  BILIRUBINUR negative 08/22/2010 1551  ? KETONESUR NEGATIVE 07/12/2021 0700  ? PROTEINUR NEGATIVE 07/12/2021 0700  ? UROBILINOGEN 2.0 (A) 08/28/2019 1122  ? NITRITE NEGATIVE 07/12/2021 0700  ? LEUKOCYTESUR SMALL (A) 07/12/2021 0700  ? ? ?Radiological Exams on Admission: ?No results found. ? ? ? ?Assessment/Plan ?Principal Problem: ?  Gallstone pancreatitis ?Active Problems: ?  Essential hypertension ?  Elevated liver function tests ?  Obstructive jaundice ?  ? ? ?Obstructive jaundice ?-Suspect that she may have bile duct obstruction from choledocholithiasis ?-MRCP performed yesterday with report pending.  I have reached out to [facility] radiology and requested that MRI be made a priority read ?-GI and general surgery consulted ?-Keep patient n.p.o. ?-Pain management, IV fluids ?-We will cover with ceftriaxone ? ?Gallstone pancreatitis ?-Lipase of 2081 ?-Keep n.p.o., IV fluids, pain management ? ?Hypertension ?-Chronically on metoprolol and amlodipine ?-Holding for now since she is n.p.o. ?-Can use as needed hydralazine ? ?Possible  UTI ?-Urinalysis with 21-50 WBCs and many bacteria ?-Check urine culture ? ?GERD ?-Continue PPI ? ?DVT prophylaxis: SCDs ?Code Status: Full code ?Family Communication: Patient's sister at the bedside.  Pati

## 2021-07-13 ENCOUNTER — Encounter (HOSPITAL_COMMUNITY)
Admission: EM | Disposition: A | Discharge status: Home / Self Care | Payer: Self-pay | Source: Home / Self Care | Attending: Internal Medicine

## 2021-07-13 ENCOUNTER — Inpatient Hospital Stay (HOSPITAL_COMMUNITY): Payer: BC Managed Care – PPO | Admitting: Anesthesiology

## 2021-07-13 ENCOUNTER — Inpatient Hospital Stay (HOSPITAL_COMMUNITY): Payer: BC Managed Care – PPO

## 2021-07-13 ENCOUNTER — Other Ambulatory Visit: Payer: Self-pay

## 2021-07-13 ENCOUNTER — Ambulatory Visit: Payer: BC Managed Care – PPO | Admitting: General Surgery

## 2021-07-13 ENCOUNTER — Encounter (HOSPITAL_COMMUNITY): Payer: Self-pay | Admitting: Family Medicine

## 2021-07-13 DIAGNOSIS — R7989 Other specified abnormal findings of blood chemistry: Secondary | ICD-10-CM | POA: Diagnosis not present

## 2021-07-13 DIAGNOSIS — R932 Abnormal findings on diagnostic imaging of liver and biliary tract: Secondary | ICD-10-CM

## 2021-07-13 DIAGNOSIS — K831 Obstruction of bile duct: Secondary | ICD-10-CM

## 2021-07-13 DIAGNOSIS — K8050 Calculus of bile duct without cholangitis or cholecystitis without obstruction: Secondary | ICD-10-CM

## 2021-07-13 DIAGNOSIS — K851 Biliary acute pancreatitis without necrosis or infection: Secondary | ICD-10-CM | POA: Diagnosis not present

## 2021-07-13 DIAGNOSIS — R17 Unspecified jaundice: Secondary | ICD-10-CM

## 2021-07-13 DIAGNOSIS — K8051 Calculus of bile duct without cholangitis or cholecystitis with obstruction: Secondary | ICD-10-CM

## 2021-07-13 DIAGNOSIS — I10 Essential (primary) hypertension: Secondary | ICD-10-CM | POA: Diagnosis not present

## 2021-07-13 HISTORY — PX: DUODENAL STENT PLACEMENT: SHX5541

## 2021-07-13 HISTORY — PX: ERCP: SHX5425

## 2021-07-13 HISTORY — PX: SPHINCTEROTOMY: SHX5279

## 2021-07-13 LAB — CBC
HCT: 38.5 % (ref 36.0–46.0)
Hemoglobin: 12.7 g/dL (ref 12.0–15.0)
MCH: 30.5 pg (ref 26.0–34.0)
MCHC: 33 g/dL (ref 30.0–36.0)
MCV: 92.3 fL (ref 80.0–100.0)
Platelets: 240 10*3/uL (ref 150–400)
RBC: 4.17 MIL/uL (ref 3.87–5.11)
RDW: 13.9 % (ref 11.5–15.5)
WBC: 9.7 10*3/uL (ref 4.0–10.5)
nRBC: 0 % (ref 0.0–0.2)

## 2021-07-13 LAB — COMPREHENSIVE METABOLIC PANEL
ALT: 183 U/L — ABNORMAL HIGH (ref 0–44)
AST: 130 U/L — ABNORMAL HIGH (ref 15–41)
Albumin: 3.1 g/dL — ABNORMAL LOW (ref 3.5–5.0)
Alkaline Phosphatase: 327 U/L — ABNORMAL HIGH (ref 38–126)
Anion gap: 7 (ref 5–15)
BUN: 10 mg/dL (ref 8–23)
CO2: 25 mmol/L (ref 22–32)
Calcium: 10.4 mg/dL — ABNORMAL HIGH (ref 8.9–10.3)
Chloride: 103 mmol/L (ref 98–111)
Creatinine, Ser: 0.54 mg/dL (ref 0.44–1.00)
GFR, Estimated: >60 mL/min (ref >60)
Glucose, Bld: 97 mg/dL (ref 70–99)
Potassium: 3.4 mmol/L — ABNORMAL LOW (ref 3.5–5.1)
Sodium: 135 mmol/L (ref 135–145)
Total Bilirubin: 12.7 mg/dL — ABNORMAL HIGH (ref 0.3–1.2)
Total Protein: 7 g/dL (ref 6.5–8.1)

## 2021-07-13 LAB — SURGICAL PCR SCREEN
MRSA, PCR: NEGATIVE
Staphylococcus aureus: NEGATIVE

## 2021-07-13 LAB — LIPASE, BLOOD: Lipase: 381 U/L — ABNORMAL HIGH (ref 11–51)

## 2021-07-13 SURGERY — ERCP, WITH INTERVENTION IF INDICATED
Anesthesia: General | Site: Esophagus

## 2021-07-13 MED ORDER — SODIUM CHLORIDE 0.9 % IV SOLN: INTRAVENOUS | Status: DC

## 2021-07-13 MED ORDER — MIDAZOLAM HCL 5 MG/5ML IJ SOLN
INTRAMUSCULAR | Status: DC | PRN
Start: 2021-07-13 — End: 2021-07-13
  Administered 2021-07-13: 2 mg via INTRAVENOUS

## 2021-07-13 MED ORDER — ORAL CARE MOUTH RINSE: 15.0000 mL | Freq: Once | OROMUCOSAL | Status: DC

## 2021-07-13 MED ORDER — STERILE WATER FOR IRRIGATION IR SOLN
Status: DC | PRN
Start: 2021-07-13 — End: 2021-07-13
  Administered 2021-07-13: 1000 mL

## 2021-07-13 MED ORDER — PROPOFOL 10 MG/ML IV BOLUS
INTRAVENOUS | Status: AC
Start: 2021-07-13 — End: ?
  Filled 2021-07-13: qty 20

## 2021-07-13 MED ORDER — ROCURONIUM BROMIDE 10 MG/ML (PF) SYRINGE
PREFILLED_SYRINGE | INTRAVENOUS | Status: AC
  Filled 2021-07-13: qty 10

## 2021-07-13 MED ORDER — SODIUM CHLORIDE 0.9 % IV SOLN
INTRAVENOUS | Status: AC
  Filled 2021-07-13: qty 50

## 2021-07-13 MED ORDER — ADULT MULTIVITAMIN W/MINERALS CH
1.0000 | ORAL_TABLET | Freq: Every day | ORAL | Status: DC
  Administered 2021-07-13 – 2021-07-16 (×4): 1 via ORAL
  Filled 2021-07-13 (×4): qty 1

## 2021-07-13 MED ORDER — PROPOFOL 10 MG/ML IV BOLUS
INTRAVENOUS | Status: DC | PRN
  Administered 2021-07-13: 160 mg via INTRAVENOUS

## 2021-07-13 MED ORDER — DEXAMETHASONE SODIUM PHOSPHATE 10 MG/ML IJ SOLN
INTRAMUSCULAR | Status: DC | PRN
  Administered 2021-07-13: 10 mg via INTRAVENOUS

## 2021-07-13 MED ORDER — GLUCAGON HCL RDNA (DIAGNOSTIC) 1 MG IJ SOLR
INTRAMUSCULAR | Status: DC | PRN
  Administered 2021-07-13 (×2): .25 mg via INTRAVENOUS

## 2021-07-13 MED ORDER — ROCURONIUM BROMIDE 100 MG/10ML IV SOLN
INTRAVENOUS | Status: DC | PRN
  Administered 2021-07-13: 60 mg via INTRAVENOUS

## 2021-07-13 MED ORDER — GLUCAGON HCL RDNA (DIAGNOSTIC) 1 MG IJ SOLR
INTRAMUSCULAR | Status: AC
  Filled 2021-07-13: qty 1

## 2021-07-13 MED ORDER — FENTANYL CITRATE (PF) 250 MCG/5ML IJ SOLN
INTRAMUSCULAR | Status: AC
  Filled 2021-07-13: qty 5

## 2021-07-13 MED ORDER — ONDANSETRON HCL 4 MG/2ML IJ SOLN
INTRAMUSCULAR | Status: DC | PRN
  Administered 2021-07-13: 4 mg via INTRAVENOUS

## 2021-07-13 MED ORDER — CHLORHEXIDINE GLUCONATE 0.12 % MT SOLN: 15.0000 mL | Freq: Once | OROMUCOSAL | Status: DC

## 2021-07-13 MED ORDER — ONDANSETRON HCL 4 MG/2ML IJ SOLN
INTRAMUSCULAR | Status: AC
  Filled 2021-07-13: qty 2

## 2021-07-13 MED ORDER — BOOST / RESOURCE BREEZE PO LIQD CUSTOM
1.0000 | Freq: Three times a day (TID) | ORAL | Status: DC
  Administered 2021-07-14 – 2021-07-15 (×3): 1 via ORAL

## 2021-07-13 MED ORDER — SODIUM CHLORIDE 0.9 % IV SOLN
INTRAVENOUS | Status: DC | PRN
  Administered 2021-07-13: 34 mL

## 2021-07-13 MED ORDER — MIDAZOLAM HCL 2 MG/2ML IJ SOLN
INTRAMUSCULAR | Status: AC
Start: 2021-07-13 — End: ?
  Filled 2021-07-13: qty 2

## 2021-07-13 MED ORDER — PROSOURCE PLUS PO LIQD
30.0000 mL | Freq: Two times a day (BID) | ORAL | Status: DC
  Administered 2021-07-13 – 2021-07-16 (×5): 30 mL via ORAL
  Filled 2021-07-13 (×5): qty 30

## 2021-07-13 MED ORDER — DEXAMETHASONE SODIUM PHOSPHATE 10 MG/ML IJ SOLN
INTRAMUSCULAR | Status: AC
  Filled 2021-07-13: qty 1

## 2021-07-13 MED ORDER — HYDROMORPHONE HCL 1 MG/ML IJ SOLN: 0.2500 mg | INTRAMUSCULAR | Status: DC | PRN

## 2021-07-13 MED ORDER — FENTANYL CITRATE (PF) 100 MCG/2ML IJ SOLN
INTRAMUSCULAR | Status: DC | PRN
  Administered 2021-07-13: 100 ug via INTRAVENOUS

## 2021-07-13 MED ORDER — MEPERIDINE HCL 50 MG/ML IJ SOLN: 6.2500 mg | INTRAMUSCULAR | Status: DC | PRN

## 2021-07-13 MED ORDER — LACTATED RINGERS IV SOLN: INTRAVENOUS | Status: DC

## 2021-07-13 MED ORDER — SUGAMMADEX SODIUM 500 MG/5ML IV SOLN
INTRAVENOUS | Status: DC | PRN
Start: 2021-07-13 — End: 2021-07-13
  Administered 2021-07-13: 200 mg via INTRAVENOUS

## 2021-07-13 SURGICAL SUPPLY — 28 items
BALLN RETRIEVAL 12X15 (BALLOONS) IMPLANT
BALN RTRVL 200 6-7FR 12-15 (BALLOONS)
BASKET TRAPEZOID 3X6 (MISCELLANEOUS) IMPLANT
BASKET TRAPEZOID LITHO 2.0X5 (MISCELLANEOUS) ×2 IMPLANT
BSKT STON RTRVL TRAPEZOID 2X5 (MISCELLANEOUS)
BSKT STON RTRVL TRAPEZOID 3X6 (MISCELLANEOUS)
DEVICE INFLATION ENCORE 26 (MISCELLANEOUS) IMPLANT
DEVICE LOCKING W-BIOPSY CAP (MISCELLANEOUS) ×2 IMPLANT
GLOVE BIOGEL PI IND STRL 7.0 (GLOVE) ×4 IMPLANT
GLOVE BIOGEL PI INDICATOR 7.0 (GLOVE)
GUIDEWIRE HYDRA JAGWIRE .35 (WIRE) IMPLANT
GUIDEWIRE JAG HINI 025X260CM (WIRE) IMPLANT
KIT ENDO PROCEDURE PEN (KITS) ×2 IMPLANT
KIT TURNOVER KIT A (KITS) ×2 IMPLANT
LUBRICANT JELLY 4.5OZ STERILE (MISCELLANEOUS) IMPLANT
PAD ARMBOARD 7.5X6 YLW CONV (MISCELLANEOUS) ×1 IMPLANT
POSITIONER HEAD 8X9X4 ADT (SOFTGOODS) IMPLANT
SCOPE SPY DS DISPOSABLE (MISCELLANEOUS) ×1 IMPLANT
SNARE ROTATE MED OVAL 20MM (MISCELLANEOUS) IMPLANT
SNARE SHORT THROW 13M SML OVAL (MISCELLANEOUS) IMPLANT
SPHINCTEROTOME AUTOTOME .25 (MISCELLANEOUS) IMPLANT
SPHINCTEROTOME HYDRATOME 44 (MISCELLANEOUS) ×2 IMPLANT
STENT RX PRELOADED 10FRX5CM (Stent) ×2 IMPLANT
SYSTEM CONTINUOUS INJECTION (MISCELLANEOUS) ×1 IMPLANT
TUBING INSUFFLATOR CO2MPACT (TUBING) ×2 IMPLANT
WALLSTENT METAL COVERED 10X60 (STENTS) IMPLANT
WALLSTENT METAL COVERED 10X80 (STENTS) IMPLANT
WATER STERILE IRR 1000ML POUR (IV SOLUTION) ×2 IMPLANT

## 2021-07-13 NOTE — Progress Notes (Signed)
Initial Nutrition Assessment ? ?DOCUMENTATION CODES:  ? ?  ? ?INTERVENTION:  ?Boost Breeze po TID, each supplement provides 250 kcal and 9 grams of protein  ? ?ProStat 30 ml TID (each 30 ml provides 100 kcal, 15 gr protein)  ?NUTRITION DIAGNOSIS:  ? ?Predicted suboptimal nutrient intake related to acute illness (abdominal pain -gallstone pancreatitis / obstructive jaundice) as evidenced by energy intake < or equal to 50% for > or equal to 5 days, per patient/family report and acute weight loss > 5%.  ? ? ?GOAL:  ?Patient will meet greater than or equal to 90% of their needs ? ? ?MONITOR:  ?Diet advancement, Labs, PO intake ? ?REASON FOR ASSESSMENT:  ? ?Malnutrition Screening Tool ?  ? ?ASSESSMENT: Patient is a 61 yo female with history of hypertension, constipation, anemia. Presents with worsening abdominal pain. Gallstone pancreatitis. ERCP today -biliary sphincterectomy and stent placed. Cholecystectomy scheduled 5/19 per MD. ? ?Patient has just returned to room from procedure and is receiving clear liquids. Poor intake- particularly the 6 days prior to admission. She is drinking sips of ginger ale. Her throat is sore. Multiple family members bedside.   ? ?Weights: acute loss 6% (5.5 kg) the past month. Patient likely acutely malnourished but unable to define at this time.  ? ?Medications: Protonix. IV drips- cipro and  IVF-LR@ 125 ml/hr.  ? ?Labs:  ? ?  Latest Ref Rng & Units 07/13/2021  ?  6:04 AM 07/12/2021  ?  2:46 AM 06/16/2021  ?  3:01 PM  ?BMP  ?Glucose 70 - 99 mg/dL 97   956   98    ?BUN 8 - 23 mg/dL 10   13   14     ?Creatinine 0.44 - 1.00 mg/dL   3.87   5.64    ?BUN/Creat Ratio 6 - 22 (calc)   NOT APPLICABLE    ?Sodium 135 - 145 mmol/L 135   136   138    ?Potassium 3.5 - 5.1 mmol/L 3.4   3.6   3.9    ?Chloride 98 - 111 mmol/L 103   107   104    ?CO2 22 - 32 mmol/L 25   24   25     ?Calcium 8.9 - 10.3 mg/dL 3.32     95.1    ?  ?NUTRITION - FOCUSED PHYSICAL EXAM: ?Deferred - patient just returned to  room from procedure ? ? ?Diet Order:   ?Diet Order   ? ?       ?  Diet clear liquid Room service appropriate? Yes; Fluid consistency: Thin  Diet effective now       ?  ? ?  ?  ? ?  ? ? ?EDUCATION NEEDS:  ?Not appropriate for education at this time ? ?Skin:  Skin Assessment: Reviewed RN Assessment ? ?Last BM:  5/15 ? ?Height:  ? ?Ht Readings from Last 1 Encounters:  ?07/12/21 5\' 6"  (1.676 m)  ? ? ?Weight:  ? ?Wt Readings from Last 1 Encounters:  ?07/12/21 79.9 kg  ? ? ?Ideal Body Weight:   59 kg ? ?BMI:  Body mass index is 28.43 kg/m?. ? ?Estimated Nutritional Needs:  ? ?Kcal:  1700-1800 ? ?Protein:  83-89 gr ? ?Fluid:  1.7-1.8 liters ? ?07/14/21 MS,RD,CSG,LDN ?Office: [phone] ?Pager: [phone]  ? ? ?

## 2021-07-13 NOTE — Anesthesia Procedure Notes (Signed)
Procedure Name: Intubation ?Date/Time: 07/13/2021 7:48 AM ?Performed by: Jonna Munro, CRNA ?Pre-anesthesia Checklist: Patient identified, Emergency Drugs available, Suction available, Patient being monitored and Timeout performed ?Patient Re-evaluated:Patient Re-evaluated prior to induction ?Oxygen Delivery Method: Circle system utilized ?Preoxygenation: Pre-oxygenation with 100% oxygen ?Induction Type: IV induction ?Laryngoscope Size: Mac and 3 ?Grade View: Grade II ?Tube type: Oral ?Tube size: 7.0 mm ?Number of attempts: 1 ?Airway Equipment and Method: Stylet ?Placement Confirmation: ETT inserted through vocal cords under direct vision, positive ETCO2 and breath sounds checked- equal and bilateral ?Secured at: 22 cm ?Tube secured with: Tape ?Dental Injury: Teeth and Oropharynx as per pre-operative assessment  ? ? ? ? ?

## 2021-07-13 NOTE — Progress Notes (Signed)
Off to OR for ERCP. Tele aware. ?

## 2021-07-13 NOTE — Anesthesia Preprocedure Evaluation (Signed)
Anesthesia Evaluation  ?Patient identified by MRN, date of birth, ID band ?Patient awake ? ? ? ?Reviewed: ?Allergy & Precautions, NPO status , Patient's Chart, lab work & pertinent test results, reviewed documented beta blocker date and time  ? ?Airway ?Mallampati: II ? ?TM Distance: >3 FB ?Neck ROM: Full ? ? ?Comment: Cervical pain, disc disease Dental ? ?(+) Dental Advisory Given, Missing ?  ?Pulmonary ?former smoker,  ?  ?Pulmonary exam normal ?breath sounds clear to auscultation ? ? ? ? ? ? Cardiovascular ?Exercise Tolerance: Good ?hypertension, Pt. on medications and Pt. on home beta blockers ?Normal cardiovascular exam ?Rhythm:Regular Rate:Normal ? ? ?  ?Neuro/Psych ?negative neurological ROS ? negative psych ROS  ? GI/Hepatic ?GERD  Medicated and Controlled,Biliary stones, obstructive jaundice ?  ?Endo/Other  ?negative endocrine ROS ? Renal/GU ?negative Renal ROS  ?negative genitourinary ?  ?Musculoskeletal ?negative musculoskeletal ROS ?(+)  ? Abdominal ?  ?Peds ?negative pediatric ROS ?(+)  Hematology ? ?(+) Blood dyscrasia, anemia ,   ?Anesthesia Other Findings ? ? Reproductive/Obstetrics ?negative OB ROS ? ?  ? ? ? ? ? ? ? ? ? ? ? ? ? ?  ?  ? ? ? ? ? ? ? ?Anesthesia Physical ?Anesthesia Plan ? ?ASA: 2 ? ?Anesthesia Plan: General  ? ?Post-op Pain Management: Dilaudid IV  ? ?Induction: Intravenous ? ?PONV Risk Score and Plan: Propofol infusion ? ?Airway Management Planned: Oral ETT ? ?Additional Equipment:  ? ?Intra-op Plan:  ? ?Post-operative Plan: Extubation in OR ? ?Informed Consent: I have reviewed the patients History and Physical, chart, labs and discussed the procedure including the risks, benefits and alternatives for the proposed anesthesia with the patient or authorized representative who has indicated his/her understanding and acceptance.  ? ? ? ?Dental advisory given ? ?Plan Discussed with: CRNA and Surgeon ? ?Anesthesia Plan Comments:   ? ? ? ? ? ?Anesthesia  Quick Evaluation ? ?

## 2021-07-13 NOTE — Progress Notes (Signed)
?PROGRESS NOTE ? ? ? ?Laurie Olson  YQM:[id number] DOB: April 17, 1960 DOA: 07/12/2021 ?PCP: Kerri PerchesSimpson, Margaret E, MD  ? ? ?Brief Narrative:  ?Laurie Bornansy D Payment is a 61 y.o. female with past medical history significant for cholelithiasis, hypertension presented to hospital with right upper quadrant abdominal pain for 1 month.  Patient was seeing GI and general surgery as outpatient.  Due to worsening abdominal pain patient decided to come to the hospital.  She was scheduled to have MRCP as outpatient due to elevated LFTs but in the ED patient was noted to have bilirubin of 10.   Transaminases elevated near baseline at 189/212 for AST/ALT respectively.  Alkaline phosphatase elevated at 368.  WBC count was normal.  She was afebrile and vitals were otherwise stable.  UA showed 21-50 WBCs and many bacteria.  Patient was then considered for admission to the hospital for further evaluation and treatment ? ?Assessment and plan. ? ?Principal Problem: ?  Gallstone pancreatitis ?Active Problems: ?  Essential hypertension ?  Elevated liver function tests ?  Obstructive jaundice ?  Pancreatic lesion ?  ?Obstructive jaundice ?Suspected on presentation.  MRCP done on 07/12/2021 showed choledocholithiasis with 1 cm gallstone in the distal common bile duct resulting in biliary duct obstruction with marked biliary dilatation/pancreatic duct dilatation.  Gallbladder was distended without signs of inflammation.  GI and general surgery was consulted.  Patient underwent ERCP 07/13/2021 with findings of large bulging ampulla with ulceration and dilated bile duct with a distal filling defect due to stone.  Biliary sphincterectomy was done with damia basketing and removal of stones and placement of biliary stent.  Plans for cholecystectomy 07/15/2021.   ? ?Acute gallstone pancreatitis ?-Lipase of 2081, continue IV fluids and analgesia.  Status post ERCP today. ?  ?Hypertension ?Home medication consists of metoprolol and amlodipine.  Currently on as  needed hydralazine. ?  ?Possible UTI ?Urinalysis with 21-50 white cells.  Urine culture pending. ?  ?GERD ?Continue PPI. ?   ? ? DVT prophylaxis: SCDs Start: 07/12/21 0749 ? ? ?Code Status:   ?  Code Status: Full Code ? ?Disposition: Home  ? ?Status is: Inpatient ?Remains inpatient appropriate because: ERCP, plan for cholecystectomy ? ? Family Communication: Spoke with multiple family members at bedside. ? ?Consultants:  ?GI,  ?general surgery ? ?Procedures:  ?ERCP with sphincterectomy and Damia basketing with balloon extraction on 07/13/2021 with placement of biliary stent ? ?Antimicrobials:  ?Ciprofloxacin ? ?Anti-infectives (From admission, onward)  ? ? Start     Dose/Rate Route Frequency Ordered Stop  ? 07/13/21 0800  ciprofloxacin (CIPRO) IVPB 400 mg       ? 400 mg ?200 mL/hr over 60 Minutes Intravenous Every 12 hours 07/12/21 1307    ? 07/12/21 0830  cefTRIAXone (ROCEPHIN) 2 g in sodium chloride 0.9 % 100 mL IVPB  Status:  Discontinued       ? 2 g ?200 mL/hr over 30 Minutes Intravenous Every 24 hours 07/12/21 0810 07/12/21 1307  ? ?  ? ?Subjective: ?Today, patient was seen and examined at bedside.  Seen after ERCP.  Patient denies overt pain nausea vomiting at this time but feels little sleepy.  Multiple family members at bedside ? ?Objective: ?Vitals:  ? 07/13/21 0317 07/13/21 0701 07/13/21 0852 07/13/21 0900  ?BP: 132/87 (!) 153/91 (!) 120/99 (!) 115/51  ?Pulse: 87 (!) 103 87 82  ?Resp: 18 17 (!) 21 (!) 25  ?Temp: 98.2 ?F (36.8 ?C) 98.7 ?F (37.1 ?C) 98.5 ?F (36.9 ?C) 98.5 ?  F (36.9 ?C)  ?TempSrc: Oral Oral    ?SpO2: 100% 98% 100% 100%  ?Weight:      ?Height:      ? ? ?Intake/Output Summary (Last 24 hours) at 07/13/2021 1014 ?Last data filed at 07/13/2021 0844 ?Gross per 24 hour  ?Intake 2110.27 ml  ?Output 0 ml  ?Net 2110.27 ml  ? ?Filed Weights  ? 07/12/21 0158 07/12/21 0938  ?Weight: 85.3 kg 79.9 kg  ? ? ?Physical Examination: ?Body mass index is 28.43 kg/m?.  ? ?General:  Average built, not in obvious  distress, Communicative but mildly sleepy ?HENT:   No scleral pallor or icterus noted. Oral mucosa is moist.  ?Chest:  Clear breath sounds.  Diminished breath sounds bilaterally. No crackles or wheezes.  ?CVS: S1 &S2 heard. No murmur.  Regular rate and rhythm. ?Abdomen: Soft, nontender, nondistended.  Bowel sounds are heard.   ?Extremities: No cyanosis, clubbing or edema.  Peripheral pulses are palpable. ?Psych: Mildly somnolent but oriented, normal mood ?CNS:  No cranial nerve deficits.  Power equal in all extremities.   ?Skin: Warm and dry.  No rashes noted. ? ?Data Reviewed:  ? ?CBC: ?Recent Labs  ?Lab 07/12/21 ?0246 07/13/21 ?0604  ?WBC 6.6 9.7  ?HGB 13.1 12.7  ?HCT 39.1 38.5  ?MCV 92.4 92.3  ?PLT 269 240  ? ? ?Basic Metabolic Panel: ?Recent Labs  ?Lab 07/12/21 ?0246 07/13/21 ?0604  ?NA 136 135  ?K 3.6 3.4*  ?CL 107 103  ?CO2 24 25  ?GLUCOSE 110* 97  ?BUN 13 10  ?CREATININE 0.75 0.54  ?CALCIUM 10.7* 10.4*  ? ? ?Liver Function Tests: ?Recent Labs  ?Lab 07/12/21 ?0246 07/13/21 ?0604  ?AST 189* 130*  ?ALT 212* 183*  ?ALKPHOS 368* 327*  ?BILITOT 10.0* 12.7*  ?PROT 7.6 7.0  ?ALBUMIN 3.5 3.1*  ? ? ? ?Radiology Studies: ?MR 3D Recon At Scanner ? ?Result Date: 07/12/2021 ?CLINICAL DATA:  Elevated liver function test with nausea. EXAM: MRI ABDOMEN WITHOUT AND WITH CONTRAST (INCLUDING MRCP) TECHNIQUE: Multiplanar multisequence MR imaging of the abdomen was performed both before and after the administration of intravenous contrast. Heavily T2-weighted images of the biliary and pancreatic ducts were obtained, and three-dimensional MRCP images were rendered by post processing. CONTRAST:  73mL GADAVIST GADOBUTROL 1 MMOL/ML IV SOLN COMPARISON:  April 22, 2021 abdominal sonogram. FINDINGS: Lower chest: No effusion or consolidation, limited assessment of the lung bases on MRI. Hepatobiliary: Large biliary calculus in the distal common bile duct with associated dilation of the common bile duct and of intrahepatic biliary tree.  Common bile duct is moderate to markedly dilated at 14 mm. Moderate to marked dilation of intrahepatic biliary radicals. Distended gallbladder. Sludge and numerous stones in the dependent gallbladder and in the gallbladder neck. The stone in the common bile duct measures 10 mm. No current pericholecystic stranding or fluid. The portal vein is patent.  No focal, suspicious hepatic lesion. Pancreas: Dilation of the main pancreatic duct. No peripancreatic stranding. Main pancreatic duct up to 3-4 mm in the tail. Mild pancreatic atrophy throughout the pancreas. Small cystic lesions in the pancreas (image 17/6) 1 in the neck at 8 mm, another in the tail at 8 mm. Some preservation of intrinsic T1 signal despite pancreatic atrophy. No visible lesion. Spleen:  Normal. Adrenals/Urinary Tract: Normal adrenal glands. Small cysts in the bilateral kidneys with mild cortical scarring on the LEFT. No hydronephrosis or perinephric stranding. Small cysts require no follow-up Stomach/Bowel: Gastrointestinal tract is unremarkable to the extent evaluated on  this abdominal MRI. Vascular/Lymphatic: No pathologically enlarged lymph nodes identified. No abdominal aortic aneurysm demonstrated. Other:  None Musculoskeletal: No suspicious bone lesions identified. IMPRESSION: 1. Choledocholithiasis with 1 cm gallstone in the distal common bile duct resulting in biliary duct obstruction with moderate to marked biliary dilation and associated with pancreatic duct dilation. Biliary duct distension is increased compared to the abdominal sonogram which was performed in February. 2. Gallbladder is distended without current signs of overt inflammation. There is also abundant sludge in numerous stones in the dependent gallbladder and gallbladder neck. 3. Mild pancreatic main duct dilation could be related in part to biliary duct obstruction. Small cystic lesions in the pancreas without overt suspicious features otherwise, suggest follow-up imaging at  1 year with MRI/MRCP to track pancreatic lesions. 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 These results will be called to the ordering clinician or representative by the Radiologist Assistant, a

## 2021-07-13 NOTE — Op Note (Signed)
[facility] ?Patient Name: Laurie Olson ?Procedure Date: 07/13/2021 6:55 AM ?MRN: [id number] ?Date of Birth: 12-17-1960 ?Attending MD: Hildred Laser , MD ?CSN: [id number] ?Age: 61 ?Admit Type: Inpatient ?Procedure:                ERCP ?Indications:              Evaluation and possible treatment of bile duct  ?                          stone(s), For therapy of bile duct stone(s),  ?                          Jaundice ?Providers:                Hildred Laser, MD, [place] Page, Charlsie Quest Collins  ?                          RN, RN, Raphael Gibney Tech., Technician ?Referring MD:             Kathie Dike, MD ?Medicines:                General Anesthesia ?Complications:            No immediate complications. ?Estimated Blood Loss:     Estimated blood loss: none. ?Procedure:                Pre-Anesthesia Assessment: ?                          - Prior to the procedure, a History and Physical  ?                          was performed, and patient medications and  ?                          allergies were reviewed. The patient's tolerance of  ?                          previous anesthesia was also reviewed. The risks  ?                          and benefits of the procedure and the sedation  ?                          options and risks were discussed with the patient.  ?                          All questions were answered, and informed consent  ?                          was obtained. Prior Anticoagulants: The patient has  ?                          taken no previous anticoagulant or antiplatelet  ?  agents. ASA Grade Assessment: II - A patient with  ?                          mild systemic disease. After reviewing the risks  ?                          and benefits, the patient was deemed in  ?                          satisfactory condition to undergo the procedure. ?                          After obtaining informed consent, the scope was  ?                          passed under direct vision.  Throughout the  ?                          procedure, the patient's blood pressure, pulse, and  ?                          oxygen saturations were monitored continuously. The  ?                          [employer] D single use  ?                          duodenoscope was introduced through the mouth, and  ?                          used to inject contrast into and used to inject  ?                          contrast into the bile duct. The ERCP was  ?                          accomplished without difficulty. The patient  ?                          tolerated the procedure well. ?Scope In: 7:59:52 AM ?Scope Out: 8:33:12 AM ?Total Procedure Duration: 0 hours 33 minutes 20 seconds  ?Findings: ?     The scout film was normal. Large bulging ampulla of Vater with  ?     erosion/ulceration at orifice. The bile duct was deeply cannulated with  ?     the Autotome sphincterotome. Contrast was injected. I personally  ?     interpreted the bile duct images. Ductal flow of contrast was adequate.  ?     Image quality was excellent. Contrast extended to the entire biliary  ?     tree. The common bile duct contained filling defect(s) thought to be a  ?     stone. The common bile duct and common hepatic duct were moderately  ?     dilated and diffusely dilated, with a stone causing an obstruction. The  ?  largest diameter was 15 mm. A 12 mm biliary sphincterotomy was made with  ?     a braided Autotome sphincterotome using ERBE electrocautery. There was  ?     no post-sphincterotomy bleeding. To discover objects, the biliary tree  ?     was swept with a 12 mm balloon and basket starting at the bifurcation.  ?     Sludge was swept from the duct. One 10 Fr by 5 cm plastic stent with a  ?     single external flap and a single internal flap was placed 4.5 cm into  ?     the common bile duct. Fluid and bile flowed through the stent. The stent  ?     was in good position. ?Impression:               - Large bulging  ampulla water with ulceration at  ?                          orifice. ?                          - Markedly dilated biliary system with distal  ?                          filling defect due to a stone. ?                          - Biliary sphincterotomy performed. Damia basket  ?                          and stone balloon extractor passed to the bile duct  ?                          with removal of debris but stone was felt to be  ?                          outside the bile duct. I.e. Mirizzi syndrome ?                          - One plastic stent was placed into the common bile  ?                          duct. ?Moderate Sedation: ?     Per Anesthesia Care ?Recommendation:           - Return patient to hospital ward for ongoing care. ?                          - Avoid aspirin and anticoagulants for 3 days. ?                          - Clear liquid diet today. ?                          - Continue present medications. ?                          -  Laparoscopic cholecystectomy as planned ?                          - Repeat ERCP in 4 weeks. ?Procedure Code(s):        --- Professional --- ?                          [id number], Endoscopic retrograde  ?                          cholangiopancreatography (ERCP); with placement of  ?                          endoscopic stent into biliary or pancreatic duct,  ?                          including pre- and post-dilation and guide wire  ?                          passage, when performed, including sphincterotomy,  ?                          when performed, each stent ?                          [id number], Endoscopic retrograde  ?                          cholangiopancreatography (ERCP); with removal of  ?                          calculi/debris from biliary/pancreatic duct(s) ?Diagnosis Code(s):        --- Professional --- ?                          K80.51, Calculus of bile duct without cholangitis  ?                          or cholecystitis with obstruction ?                           R17, Unspecified jaundice ?                          R93.2, Abnormal findings on diagnostic imaging of  ?                          liver and biliary tract ?CPT copyright 2019 [employer]. All rights reserved. ?The codes documented in this report are preliminary and upon coder review may  ?be revised to meet current compliance requirements. ?Hildred Laser, MD ?Hildred Laser, MD ?07/13/2021 8:58:03 AM ?This report has been signed electronically. ?Number of Addenda: 0 ?

## 2021-07-13 NOTE — Anesthesia Postprocedure Evaluation (Signed)
Anesthesia Post Note ? ?Patient: Laurie Olson ? ?Procedure(s) Performed: ENDOSCOPIC RETROGRADE CHOLANGIOPANCREATOGRAPHY (ERCP), SPHINCTEROTOMY WITH DUODENAL STENT (Esophagus) ? ?Patient location during evaluation: PACU ?Anesthesia Type: General ?Level of consciousness: awake and alert and oriented ?Pain management: pain level controlled ?Vital Signs Assessment: post-procedure vital signs reviewed and stable ?Respiratory status: spontaneous breathing, nonlabored ventilation and respiratory function stable ?Cardiovascular status: blood pressure returned to baseline and stable ?Postop Assessment: no apparent nausea or vomiting ?Anesthetic complications: no ? ? ?No notable events documented. ? ? ?Last Vitals:  ?Vitals:  ? 07/13/21 0852 07/13/21 0900  ?BP: (!) 120/99 (!) 115/51  ?Pulse: 87 82  ?Resp: (!) 21 (!) 25  ?Temp: 36.9 ?C 36.9 ?C  ?SpO2: 100% 100%  ?  ?Last Pain:  ?Vitals:  ? 07/13/21 0900  ?TempSrc:   ?PainSc: 0-No pain  ? ? ?  ?  ?  ?  ?  ?  ? ?Kellyn Mccary C Sora Olivo ? ? ? ? ?

## 2021-07-13 NOTE — Transfer of Care (Signed)
Immediate Anesthesia Transfer of Care Note ? ?Patient: Laurie Olson ? ?Procedure(s) Performed: ENDOSCOPIC RETROGRADE CHOLANGIOPANCREATOGRAPHY (ERCP), SPHINCTEROTOMY WITH DUODENAL STENT (Esophagus) ? ?Patient Location: PACU ? ?Anesthesia Type:General ? ?Level of Consciousness: awake, alert , oriented and patient cooperative ? ?Airway & Oxygen Therapy: Patient Spontanous Breathing and Patient connected to face mask oxygen ? ?Post-op Assessment: Report given to RN, Post -op Vital signs reviewed and stable and Patient moving all extremities X 4 ? ?Post vital signs: Reviewed and stable ? ?Last Vitals:  ?Vitals Value Taken Time  ?BP 120/99 07/13/21 0852  ?Temp 36.9 ?C 07/13/21 0852  ?Pulse 85 07/13/21 0856  ?Resp 16 07/13/21 0856  ?SpO2 100 % 07/13/21 0856  ?Vitals shown include unvalidated device data. ? ?Last Pain:  ?Vitals:  ? 07/13/21 0701  ?TempSrc: Oral  ?PainSc:   ?   ? ?  ? ?Complications: No notable events documented. ?

## 2021-07-13 NOTE — Progress Notes (Signed)
?Subjective: ? ?Patient has no complaints.  She is presently pain-free.  She took pain medication over 3 hours ago.  She denies chest pain shortness of breath or pruritus. ? ?Current Medications: ? ?Current Facility-Administered Medications:  ?  [MAR Hold] acetaminophen (TYLENOL) tablet 650 mg, 650 mg, Oral, Q6H PRN, 650 mg at 07/12/21 2214 **OR** [MAR Hold] acetaminophen (TYLENOL) suppository 650 mg, 650 mg, Rectal, Q6H PRN, Kathie Dike, MD ?  [UXL Hold] ciprofloxacin (CIPRO) IVPB 400 mg, 400 mg, Intravenous, Q12H, Jodi Mourning, Kristen S, PA-C ?  [MAR Hold] diphenhydrAMINE (BENADRYL) injection 25 mg, 25 mg, Intravenous, Q6H PRN, Kathie Dike, MD, 25 mg at 07/12/21 0908 ?  [MAR Hold] fentaNYL (SUBLIMAZE) injection 12.5-50 mcg, 12.5-50 mcg, Intravenous, Q2H PRN, Kathie Dike, MD, 50 mcg at 07/13/21 0521 ?  glucagon (human recombinant) (GLUCAGEN) 1 MG injection, , , ,  ?  [MAR Hold] hydrALAZINE (APRESOLINE) injection 10 mg, 10 mg, Intravenous, Q4H PRN, Kathie Dike, MD ?  lactated ringers infusion, , Intravenous, Continuous, Memon, Jolaine Artist, MD, Last Rate: 125 mL/hr at 07/13/21 0457, Infusion Verify at 07/13/21 0457 ?  [MAR Hold] ondansetron (ZOFRAN) tablet 4 mg, 4 mg, Oral, Q6H PRN **OR** [MAR Hold] ondansetron (ZOFRAN) injection 4 mg, 4 mg, Intravenous, Q6H PRN, Kathie Dike, MD, 4 mg at 07/12/21 0835 ?  [MAR Hold] oxyCODONE (Oxy IR/ROXICODONE) immediate release tablet 5 mg, 5 mg, Oral, Q4H PRN, Kathie Dike, MD, 5 mg at 07/13/21 0043 ?  [MAR Hold] pantoprazole (PROTONIX) injection 40 mg, 40 mg, Intravenous, Q24H, Memon, Jehanzeb, MD, 40 mg at 07/12/21 0836 ?  sodium chloride 0.9 % infusion, , , ,  ?  sterile water for irrigation for irrigation, , , PRN, Slater Mcmanaman, Mechele Dawley, MD, 1,000 mL at 07/13/21 0716 ? ? ?Objective: ?Blood pressure (!) 153/91, pulse (!) 103, temperature 98.7 ?F (37.1 ?C), temperature source Oral, resp. rate 17, height 5' 6"  (1.676 m), weight 79.9 kg, last menstrual period 07/09/2010,  SpO2 98 %. ?Patient is alert and in no acute distress. ?Sclera is deeply icteric. ?Cardiac exam with regular rhythm normal S1 and S2.  No murmur or gallop noted. ?Auscultation of lungs reveal vesicular breath sounds bilaterally. ?Abdomen is symmetrical and soft without tenderness organomegaly or masses. ? ?Labs/studies Results: ? ? ? ?  Latest Ref Rng & Units 07/13/2021  ?  6:04 AM 07/12/2021  ?  2:46 AM 06/16/2021  ?  3:01 PM  ?CBC  ?WBC 4.0 - 10.5 K/uL 9.7   6.6   4.5    ?Hemoglobin 12.0 - 15.0 g/dL 12.7   13.1   14.0    ?Hematocrit 36.0 - 46.0 % 38.5   39.1   41.6    ?Platelets 150 - 400 K/uL 240   269   243    ?  ? ?  Latest Ref Rng & Units 07/12/2021  ?  2:46 AM 06/16/2021  ?  3:01 PM 05/24/2021  ? 10:58 AM  ?CMP  ?Glucose 70 - 99 mg/dL 110   98   89    ?BUN 8 - 23 mg/dL 13   14   16     ?Creatinine 0.44 - 1.00 mg/dL 0.75   0.93   0.94    ?Sodium 135 - 145 mmol/L 136   138   141    ?Potassium 3.5 - 5.1 mmol/L 3.6   3.9   4.4    ?Chloride 98 - 111 mmol/L 107   104   106    ?CO2 22 - 32 mmol/L  24   25   27     ?Calcium 8.9 - 10.3 mg/dL 10.7   11.2   11.1    ?Total Protein 6.5 - 8.1 g/dL 7.6   7.0   7.0    ?Total Bilirubin 0.3 - 1.2 mg/dL 10.0   1.5   0.9    ?Alkaline Phos 38 - 126 U/L 368      ?AST 15 - 41 U/L 189   209   76    ?ALT 0 - 44 U/L 212   218   137    ?  ? ?  Latest Ref Rng & Units 07/12/2021  ?  2:46 AM 06/16/2021  ?  3:01 PM 05/24/2021  ? 10:58 AM  ?Hepatic Function  ?Total Protein 6.5 - 8.1 g/dL 7.6   7.0   7.0    ?Albumin 3.5 - 5.0 g/dL 3.5      ?AST 15 - 41 U/L 189   209   76    ?ALT 0 - 44 U/L 212   218   137    ?Alk Phosphatase 38 - 126 U/L 368      ?Total Bilirubin 0.3 - 1.2 mg/dL 10.0   1.5   0.9    ?Bilirubin, Direct 0.0 - 0.2 mg/dL   0.3    ?  ?Serum lipase and LFTs pending. ? ?Assessment: ? ?#1.  Obstructive jaundice secondary to choledocholithiasis.  Patient is stable for ERCP with therapeutic intervention. ? ?#2.  Elevated serum lipase felt to be secondary to mild pancreatitis.  No MR evidence of  pancreatic inflammation. ? ?Plan: ? ?ERCP with sphincterotomy and stone extraction unless patient has Mirizzi syndrome in which case will undertake biliary stenting. ?Procedure risks reviewed with the patient and she is agreeable. ? ? ? ? ? ?

## 2021-07-13 NOTE — Progress Notes (Signed)
ERCP findings. ? 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 ?CBD cannulated with Rx 44 autotome and Hydra Jagwire.  Markedly dilated CBD and CHD.  Distal filling defects. ?Biliary sphincterotomy performed.  As a sphincterotomy was completed contrast and bile started to flow into duodenum. ?Damia basket and stone balloon extractor passed through bile duct with removal of some debris.  Stone felt to be outside the the bile duct. ?Therefore 10 French 5 cm long biliary stent placed for decompression. ?Pancreatic duct was not cannulated or filled with contrast. ?Patient tolerated the procedure well. ?

## 2021-07-13 NOTE — Progress Notes (Signed)
[facility] Progress Note ? ?Day of Surgery  ?Subjective: ?ERCP this AM. Already looking less jaundice. Feeling fine. No pain complaints.  ? ?Objective: ?Vital signs in last 24 hours: ?Temp:  [98.2 ?F (36.8 ?C)-99.3 ?F (37.4 ?C)] 98.5 ?F (36.9 ?C) (05/17 0900) ?Pulse Rate:  [82-103] 82 (05/17 0900) ?Resp:  [17-25] 25 (05/17 0900) ?BP: (115-153)/(51-99) 115/51 (05/17 0900) ?SpO2:  [98 %-100 %] 100 % (05/17 0900) ?FiO2 (%):  [21 %] 21 % (05/17 0847) ?Last BM Date : 07/11/21 ? ?Intake/Output from previous day: ?05/16 0701 - 05/17 0700 ?In: 1310.3 [P.O.:120; I.V.:1090.3; IV Piggyback:100] ?Out: -  ?Intake/Output this shift: ?Total I/O ?In: 800 [I.V.:600; IV Piggyback:200] ?Out: 0  ? ?General appearance: alert and no distress ?GI: soft, nondistended, nontender  ? ?Lab Results:  ?Recent Labs  ?  07/12/21 ?0246 07/13/21 ?0604  ?WBC 6.6 9.7  ?HGB 13.1 12.7  ?HCT 39.1 38.5  ?PLT 269 240  ? ?BMET ?Recent Labs  ?  07/12/21 ?0246 07/13/21 ?0604  ?NA 136 135  ?K 3.6 3.4*  ?CL 107 103  ?CO2 24 25  ?GLUCOSE 110* 97  ?BUN 13 10  ?CREATININE 0.75 0.54  ?CALCIUM 10.7* 10.4*  ? ?PT/INR ?No results for input(s): LABPROT, INR in the last 72 hours. ? ?Studies/Results: ?MR 3D Recon At Scanner ? ?Result Date: 07/12/2021 ?CLINICAL DATA:  Elevated liver function test with nausea. EXAM: MRI ABDOMEN WITHOUT AND WITH CONTRAST (INCLUDING MRCP) TECHNIQUE: Multiplanar multisequence MR imaging of the abdomen was performed both before and after the administration of intravenous contrast. Heavily T2-weighted images of the biliary and pancreatic ducts were obtained, and three-dimensional MRCP images were rendered by post processing. CONTRAST:  73mL GADAVIST GADOBUTROL 1 MMOL/ML IV SOLN COMPARISON:  April 22, 2021 abdominal sonogram. FINDINGS: Lower chest: No effusion or consolidation, limited assessment of the lung bases on MRI. Hepatobiliary: Large biliary calculus in the distal common bile duct with associated dilation of the  common bile duct and of intrahepatic biliary tree. Common bile duct is moderate to markedly dilated at 14 mm. Moderate to marked dilation of intrahepatic biliary radicals. Distended gallbladder. Sludge and numerous stones in the dependent gallbladder and in the gallbladder neck. The stone in the common bile duct measures 10 mm. No current pericholecystic stranding or fluid. The portal vein is patent.  No focal, suspicious hepatic lesion. Pancreas: Dilation of the main pancreatic duct. No peripancreatic stranding. Main pancreatic duct up to 3-4 mm in the tail. Mild pancreatic atrophy throughout the pancreas. Small cystic lesions in the pancreas (image 17/6) 1 in the neck at 8 mm, another in the tail at 8 mm. Some preservation of intrinsic T1 signal despite pancreatic atrophy. No visible lesion. Spleen:  Normal. Adrenals/Urinary Tract: Normal adrenal glands. Small cysts in the bilateral kidneys with mild cortical scarring on the LEFT. No hydronephrosis or perinephric stranding. Small cysts require no follow-up Stomach/Bowel: Gastrointestinal tract is unremarkable to the extent evaluated on this abdominal MRI. Vascular/Lymphatic: No pathologically enlarged lymph nodes identified. No abdominal aortic aneurysm demonstrated. Other:  None Musculoskeletal: No suspicious bone lesions identified. IMPRESSION: 1. Choledocholithiasis with 1 cm gallstone in the distal common bile duct resulting in biliary duct obstruction with moderate to marked biliary dilation and associated with pancreatic duct dilation. Biliary duct distension is increased compared to the abdominal sonogram which was performed in February. 2. Gallbladder is distended without current signs of overt inflammation. There is also abundant sludge in numerous stones in the dependent gallbladder and gallbladder neck. 3. Mild  pancreatic main duct dilation could be related in part to biliary duct obstruction. Small cystic lesions in the pancreas without overt  suspicious features otherwise, suggest follow-up imaging at 1 year with MRI/MRCP to track pancreatic lesions. 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 These results will be called to the ordering clinician or representative by the Radiologist Assistant, and communication documented in the PACS or [employer]. Electronically Signed   By: Donzetta Kohut M.D.   On: 07/12/2021 08:23  ? ?DG ERCP ? ?Result Date: 07/13/2021 ?CLINICAL DATA:  Choledocholithiasis, biliary sphincterotomy with stent placement EXAM: ERCP TECHNIQUE: Multiple spot images obtained with the fluoroscopic device and submitted for interpretation post-procedure. COMPARISON:  None Available. FINDINGS: Images document endoscopic cannulation And opacification of the CBD, passage of basket through the CBD, passage of balloon catheter through the CBD and subsequent placement of plastic biliary stent. There is incomplete opacification of the intrahepatic biliary tree, which appears mildly distended centrally. No extravasation identified. IMPRESSION: Endoscopic CBD cannulation and intervention with stent placement. These images were submitted for radiologic interpretation only. Please see the procedural report for the amount of contrast and the fluoroscopy time utilized. Electronically Signed   By: Corlis Leak M.D.   On: 07/13/2021 08:59  ? ?MR ABDOMEN MRCP W WO CONTAST ? ?Result Date: 07/12/2021 ?CLINICAL DATA:  Elevated liver function test with nausea. EXAM: MRI ABDOMEN WITHOUT AND WITH CONTRAST (INCLUDING MRCP) TECHNIQUE: Multiplanar multisequence MR imaging of the abdomen was performed both before and after the administration of intravenous contrast. Heavily T2-weighted images of the biliary and pancreatic ducts were obtained, and three-dimensional MRCP images were rendered by post processing. CONTRAST:  45mL GADAVIST GADOBUTROL 1 MMOL/ML IV SOLN COMPARISON:  April 22, 2021 abdominal sonogram. FINDINGS: Lower chest: No effusion or  consolidation, limited assessment of the lung bases on MRI. Hepatobiliary: Large biliary calculus in the distal common bile duct with associated dilation of the common bile duct and of intrahepatic biliary tree. Common bile duct is moderate to markedly dilated at 14 mm. Moderate to marked dilation of intrahepatic biliary radicals. Distended gallbladder. Sludge and numerous stones in the dependent gallbladder and in the gallbladder neck. The stone in the common bile duct measures 10 mm. No current pericholecystic stranding or fluid. The portal vein is patent.  No focal, suspicious hepatic lesion. Pancreas: Dilation of the main pancreatic duct. No peripancreatic stranding. Main pancreatic duct up to 3-4 mm in the tail. Mild pancreatic atrophy throughout the pancreas. Small cystic lesions in the pancreas (image 17/6) 1 in the neck at 8 mm, another in the tail at 8 mm. Some preservation of intrinsic T1 signal despite pancreatic atrophy. No visible lesion. Spleen:  Normal. Adrenals/Urinary Tract: Normal adrenal glands. Small cysts in the bilateral kidneys with mild cortical scarring on the LEFT. No hydronephrosis or perinephric stranding. Small cysts require no follow-up Stomach/Bowel: Gastrointestinal tract is unremarkable to the extent evaluated on this abdominal MRI. Vascular/Lymphatic: No pathologically enlarged lymph nodes identified. No abdominal aortic aneurysm demonstrated. Other:  None Musculoskeletal: No suspicious bone lesions identified. IMPRESSION: 1. Choledocholithiasis with 1 cm gallstone in the distal common bile duct resulting in biliary duct obstruction with moderate to marked biliary dilation and associated with pancreatic duct dilation. Biliary duct distension is increased compared to the abdominal sonogram which was performed in February. 2. Gallbladder is distended without current signs of overt inflammation. There is also abundant sludge in numerous stones in the dependent gallbladder and  gallbladder neck. 3. Mild pancreatic main duct dilation could  be related in part to biliary duct obstruction. Small cystic lesions in the pancreas without overt suspicious features otherwise, suggest follow-up imaging at 1 year

## 2021-07-13 NOTE — Hospital Course (Addendum)
Laurie Olson is a 61 y.o. female with past medical history significant for cholelithiasis, hypertension presented to the hospital with right upper quadrant abdominal pain for 1 month.  Patient was seeing GI and general surgery as outpatient.  Due to worsening abdominal pain, patient decided to come to the hospital.  She was scheduled to have MRCP as outpatient due to elevated LFTs but in the ED patient was noted to have bilirubin of 10.   Transaminases elevated near baseline at 189/212 for AST/ALT respectively.  Alkaline phosphatase elevated at 368.  WBC count was normal.  She was afebrile and vitals were otherwise stable.  UA showed 21-50 WBCs and many bacteria.  Patient was then considered for admission to the hospital for further evaluation and treatment  Assessment and plan.  Principal Problem:   Gallstone pancreatitis Active Problems:   Essential hypertension   Elevated liver function tests   Obstructive jaundice   Pancreatic lesion   Choledocholithiasis   Hypokalemia   Obstructive jaundice MRCP done on 07/12/2021 showed choledocholithiasis with 1 cm gallstone in the distal common bile duct resulting in biliary duct obstruction with marked biliary dilatation/pancreatic duct dilatation.  Gallbladder was distended without signs of inflammation.  GI and general surgery was consulted.  Patient underwent ERCP 07/13/2021 with findings of large bulging ampulla with ulceration and dilated bile duct with a distal filling defect due to stone.  Biliary sphincterectomy was done with damia basketing and removal of stones and placement of biliary stent.  Trending down LFTs at this time.  Continue ciprofloxacin. Status post  cholecystectomy 07/15/2021.  Has been started on full liquid diet.  Acute gallstone pancreatitis -Lipase of 2081 on presentation.  Lipase has trended down to 44.  Status postcholecystectomy on 07/15/2021.  On full liquids.  Hypokalemia. Potassium 3.1 today.  We will replenish orally.   Check levels in a.m.  Hypertension Home medication consists of metoprolol and amlodipine.  Currently on as needed hydralazine.  Blood pressure stable at this time.   Possible UTI Urinalysis with 21-50 white cells.  Urine culture with more than 100,000 gram-negative rods.  Currently on ciprofloxacin which should cover this.   GERD Continue PPI.

## 2021-07-14 ENCOUNTER — Encounter (HOSPITAL_COMMUNITY): Payer: Self-pay | Admitting: Internal Medicine

## 2021-07-14 ENCOUNTER — Telehealth: Payer: Self-pay | Admitting: Gastroenterology

## 2021-07-14 DIAGNOSIS — R7989 Other specified abnormal findings of blood chemistry: Secondary | ICD-10-CM | POA: Diagnosis not present

## 2021-07-14 DIAGNOSIS — I10 Essential (primary) hypertension: Secondary | ICD-10-CM | POA: Diagnosis not present

## 2021-07-14 DIAGNOSIS — K831 Obstruction of bile duct: Secondary | ICD-10-CM | POA: Diagnosis not present

## 2021-07-14 DIAGNOSIS — K851 Biliary acute pancreatitis without necrosis or infection: Secondary | ICD-10-CM | POA: Diagnosis not present

## 2021-07-14 DIAGNOSIS — K8309 Other cholangitis: Secondary | ICD-10-CM | POA: Diagnosis not present

## 2021-07-14 LAB — COMPREHENSIVE METABOLIC PANEL
ALT: 117 U/L — ABNORMAL HIGH (ref 0–44)
AST: 55 U/L — ABNORMAL HIGH (ref 15–41)
Albumin: 2.7 g/dL — ABNORMAL LOW (ref 3.5–5.0)
Alkaline Phosphatase: 260 U/L — ABNORMAL HIGH (ref 38–126)
Anion gap: 7 (ref 5–15)
BUN: 14 mg/dL (ref 8–23)
CO2: 23 mmol/L (ref 22–32)
Calcium: 10.2 mg/dL (ref 8.9–10.3)
Chloride: 107 mmol/L (ref 98–111)
Creatinine, Ser: 0.71 mg/dL (ref 0.44–1.00)
GFR, Estimated: >60 mL/min (ref >60)
Glucose, Bld: 112 mg/dL — ABNORMAL HIGH (ref 70–99)
Potassium: 3.1 mmol/L — ABNORMAL LOW (ref 3.5–5.1)
Sodium: 137 mmol/L (ref 135–145)
Total Bilirubin: 3.8 mg/dL — ABNORMAL HIGH (ref 0.3–1.2)
Total Protein: 6.4 g/dL — ABNORMAL LOW (ref 6.5–8.1)

## 2021-07-14 LAB — URINE CULTURE: Culture: >=100000 — AB

## 2021-07-14 LAB — CBC
HCT: 36 % (ref 36.0–46.0)
Hemoglobin: 11.7 g/dL — ABNORMAL LOW (ref 12.0–15.0)
MCH: 29.8 pg (ref 26.0–34.0)
MCHC: 32.5 g/dL (ref 30.0–36.0)
MCV: 91.8 fL (ref 80.0–100.0)
Platelets: 216 10*3/uL (ref 150–400)
RBC: 3.92 MIL/uL (ref 3.87–5.11)
RDW: 14.1 % (ref 11.5–15.5)
WBC: 11 10*3/uL — ABNORMAL HIGH (ref 4.0–10.5)
nRBC: 0 % (ref 0.0–0.2)

## 2021-07-14 LAB — MAGNESIUM: Magnesium: 1.9 mg/dL (ref 1.7–2.4)

## 2021-07-14 LAB — LIPASE, BLOOD: Lipase: 44 U/L (ref 11–51)

## 2021-07-14 MED ORDER — CIPROFLOXACIN IN D5W 400 MG/200ML IV SOLN
400.0000 mg | INTRAVENOUS | Status: AC
  Administered 2021-07-15: 400 mg via INTRAVENOUS
  Filled 2021-07-14: qty 200

## 2021-07-14 MED ORDER — CHLORHEXIDINE GLUCONATE CLOTH 2 % EX PADS
6.0000 | MEDICATED_PAD | Freq: Once | CUTANEOUS | Status: AC
  Administered 2021-07-14: 6 via TOPICAL

## 2021-07-14 MED ORDER — CIPROFLOXACIN IN D5W 400 MG/200ML IV SOLN: 400.0000 mg | Freq: Two times a day (BID) | INTRAVENOUS | Status: DC

## 2021-07-14 MED ORDER — CHLORHEXIDINE GLUCONATE CLOTH 2 % EX PADS: 6.0000 | MEDICATED_PAD | Freq: Once | CUTANEOUS | Status: DC

## 2021-07-14 NOTE — Telephone Encounter (Signed)
Please arrange hospital follow-up in 2 weeks. She will need repeat ERCP in 4 weeks. Can be arranged at her follow-up.   Please note, patient is still hospitalized, but GI has signed off.

## 2021-07-14 NOTE — Progress Notes (Signed)
[facility] Progress Note  1 Day Post-Op  Subjective: Patient seen and evaluated. Multiple family members at bedside. She reports feeling better after the ERCP. She reports some soreness in her LUQ. She denies any epigastric pain. No nausea or vomiting. She has been able to drink some fluids and has tolerated it well.  Objective: Vital signs in last 24 hours: Temp:  [98.1 F (36.7 C)-98.2 F (36.8 C)] 98.2 F (36.8 C) (05/18 0330) Pulse Rate:  [62-72] 62 (05/18 0330) Resp:  [18] 18 (05/18 0330) BP: (125-143)/(73-77) 143/77 (05/18 0330) SpO2:  [98 %-100 %] 100 % (05/18 0330) Last BM Date : 07/11/21  Intake/Output from previous day: 05/17 0701 - 05/18 0700 In: 1549.7 [P.O.:240; I.V.:909.7; IV Piggyback:400] Out: 0  Intake/Output this shift: Total I/O In: 200 [IV Piggyback:200] Out: -   Physical Exam Constitutional: Awake, alert, laying in bed comfortably. No acute distress. Eyes: Scleral icterus improved from yesterday. EOMI Abdomen: Soft, non-distended. Tenderness to deep palpation in LUQ. No rigidity, guarding, or rebound tenderness. Skin: Warm and dry. Neuro: AAOx3. No focal deficits on exam. Psych: Normal mood and affect.  Lab Results:  Recent Labs    07/13/21 0604 07/14/21 0605  WBC 9.7 11.0*  HGB 12.7 11.7*  HCT 38.5 36.0  PLT 240 216   BMET Recent Labs    07/13/21 0604 07/14/21 0605  NA 135 137  K 3.4* 3.1*  CL 103 107  CO2 25 23  GLUCOSE 97 112*  BUN 10 14  CREATININE 0.54 0.71  CALCIUM 10.4* 10.2   PT/INR No results for input(s): LABPROT, INR in the last 72 hours.  Studies/Results: DG ERCP  Result Date: 07/13/2021 CLINICAL DATA:  Choledocholithiasis, biliary sphincterotomy with stent placement EXAM: ERCP TECHNIQUE: Multiple spot images obtained with the fluoroscopic device and submitted for interpretation post-procedure. COMPARISON:  None Available. FINDINGS: Images document endoscopic cannulation And opacification of the CBD,  passage of basket through the CBD, passage of balloon catheter through the CBD and subsequent placement of plastic biliary stent. There is incomplete opacification of the intrahepatic biliary tree, which appears mildly distended centrally. No extravasation identified. IMPRESSION: Endoscopic CBD cannulation and intervention with stent placement. These images were submitted for radiologic interpretation only. Please see the procedural report for the amount of contrast and the fluoroscopy time utilized. Electronically Signed   By: Lucrezia Europe M.D.   On: 07/13/2021 08:59    Anti-infectives: Anti-infectives (From admission, onward)    Start     Dose/Rate Route Frequency Ordered Stop   07/13/21 0800  ciprofloxacin (CIPRO) IVPB 400 mg        400 mg 200 mL/hr over 60 Minutes Intravenous Every 12 hours 07/12/21 1307     07/12/21 0830  cefTRIAXone (ROCEPHIN) 2 g in sodium chloride 0.9 % 100 mL IVPB  Status:  Discontinued        2 g 200 mL/hr over 30 Minutes Intravenous Every 24 hours 07/12/21 0810 07/12/21 1307       Assessment/Plan: s/p Procedure(s): ENDOSCOPIC RETROGRADE CHOLANGIOPANCREATOGRAPHY (ERCP) SPHINCTEROTOMY DUODENAL STENT PLACEMENT  Laurie Olson is a 61 y.o. female who is POD#1 from ERCP with sphincterectomy and duodenal stent placement.  Plan for laparoscopic cholecystectomy tomorrow - Lipase wnl. Down to 25 today from 1,931 two days ago. - AST, ALT, Alk Phos, and Tbili all trending down from yesterday - Jaundice improved from yesterday - Diet NPO after midnight - Labs in AM   LOS: 2 days    Nelva Nay 07/14/2021

## 2021-07-14 NOTE — Progress Notes (Signed)
Subjective: Overall, feeling much better since having ERCP yesterday.  Reports she only has very mild pain in her left upper quadrant.  No nausea or vomiting.  Has been tolerating clear liquid diet fine.  She also ate some crackers and has not had any worsening of her pain.    Objective: Vital signs in last 24 hours: Temp:  [98.1 F (36.7 C)-98.2 F (36.8 C)] 98.2 F (36.8 C) (05/18 0330) Pulse Rate:  [62-72] 62 (05/18 0330) Resp:  [18] 18 (05/18 0330) BP: (125-143)/(73-77) 143/77 (05/18 0330) SpO2:  [98 %-100 %] 100 % (05/18 0330) Last BM Date : 07/11/21 General:   Alert and oriented, pleasant Head:  Normocephalic and atraumatic. Eyes:  slight scleral icterus. Abdomen:  Bowel sounds present, soft, non-distended.  Mild TTP in LUQ region.  No HSM or hernias noted. No rebound or guarding. No masses appreciated  Msk:  Symmetrical without gross deformities.  Extremities:  Without  edema. Neurologic:  Alert and  oriented x4 Psych:  Normal mood and affect.  Intake/Output from previous day: 05/17 0701 - 05/18 0700 In: 1549.7 [P.O.:240; I.V.:909.7; IV Piggyback:400] Out: 0  Intake/Output this shift: Total I/O In: 440 [P.O.:240; IV Piggyback:200] Out: -   Lab Results: Recent Labs    07/12/21 0246 07/13/21 0604 07/14/21 0605  WBC 6.6 9.7 11.0*  HGB 13.1 12.7 11.7*  HCT 39.1 38.5 36.0  PLT 269 240 216   BMET Recent Labs    07/12/21 0246 07/13/21 0604 07/14/21 0605  NA 136 135 137  K 3.6 3.4* 3.1*  CL 107 103 107  CO2 _0 GLUCOSE 110* 97 112*  BUN _1 CREATININE 0.75 0.54 0.71  CALCIUM 10.7* 10.4* 10.2   LFT Recent Labs    07/12/21 0246 07/13/21 0604 07/14/21 0605  PROT 7.6 7.0 6.4*  ALBUMIN 3.5 3.1* 2.7*  AST 189* 130* 55*  ALT 212* 183* 117*  ALKPHOS 368* 327* 260*  BILITOT 10.0* 12.7* 3.8*    Studies/Results: DG ERCP  Result Date: 07/13/2021 CLINICAL DATA:  Choledocholithiasis, biliary sphincterotomy with stent placement EXAM: ERCP  TECHNIQUE: Multiple spot images obtained with the fluoroscopic device and submitted for interpretation post-procedure. COMPARISON:  None Available. FINDINGS: Images document endoscopic cannulation And opacification of the CBD, passage of basket through the CBD, passage of balloon catheter through the CBD and subsequent placement of plastic biliary stent. There is incomplete opacification of the intrahepatic biliary tree, which appears mildly distended centrally. No extravasation identified. IMPRESSION: Endoscopic CBD cannulation and intervention with stent placement. These images were submitted for radiologic interpretation only. Please see the procedural report for the amount of contrast and the fluoroscopy time utilized. Electronically Signed   By: Lucrezia Europe M.D.   On: 07/13/2021 08:59    Assessment: 61 year old female with medical history of cholelithiasis, HTN, found to have elevated LFTs with mildly dilated CBD on ultrasound in February 2023, following with GI and general surgery outpatient with laboratory work-up thus far nonspecific with positive ANA, 1-40 titer, IgG, ASMA, AMA within normal limits.  Ferritin and iron saturation elevated.  Dr. Constance Haw had ordered an MRCP which was completed 5/15 revealing choledocholithiasis with 1 cm gallstone in the distal common bile duct resulting in biliary duct obstruction with moderate to marked biliary dilation and associated pancreatic duct dilation.  Gallbladder distended without overt inflammation, numerous stones and abundant sludge in the gallbladder and gallbladder neck.  Also with a small cystic lesion in the pancreas without overt suspicious  features with recommendations for follow-up MRI/MRCP in 1 year.  Patient presented to the emergency room early morning of 5/17 due to acute worsening of epigastric pain radiating to right upper quadrant and right shoulder blade with associated nausea without vomiting, found to have elevated LFTs, bilirubin of 10.0, and  Lipase >2000, admitted with obstructive jaundice. GI and general surgery consulted.   Obstructive jaundice:  S/p ERCP 5/17 with large bulging ampulla with ulceration at orifice, markedly dilated biliary system with distal filling defect due to stone, biliary sphincterotomy performed.  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  Bilirubin significantly improved today, down to 3.8 from max of 12.7 yesterday.  LFTs also improving with AST 55, ALT 117, alk phos 260 today. WBC count up slightly today to 11.  She is on IV Cipro.  Clinically, she is feeling significantly improved following ERCP.  Only with mild LUQ abdominal pain.  Tolerating clear liquid diet and crackers today without any worsening pain.  General surgery is planning on cholecystectomy tomorrow.  Acute pancreatitis: With elevated lipase but without pancreatic stranding on MRI.  Secondary to biliary etiology s/p ERCP 5/17 for biliary decompression with stent placement. Clinically feeling improved.  Only with mild LUQ abdominal pain, but tolerating clear liquid diet and crackers today without worsening abdominal pain.  No nausea or vomiting.  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 LFTs and bilirubin improving.  Cholecystectomy planned tomorrow.  Will need to continue to follow LFTs to normalization outpatient.  Pancreatic cystic lesion: Small cystic lesion in the pancreas noted on MRI with recommendations to repeat MRI/MRCP in 1 year.   Plan: Planning for cholecystectomy tomorrow with general surgery. Diet advancement per general surgery. Complete course of empiric antibiotics. Continue PPI daily. Continue to trend LFTs daily. Will need repeat ERCP in 4 weeks. MRI/MRCP in 1 year to  follow-up on pancreatic lesion.  GI will sign off. Will arrange follow-up in the clinic with Dr. Jenetta Downer in about 4 weeks.    LOS: 2 days    07/14/2021, 1:50 PM   Laurie Olson, [facility]

## 2021-07-14 NOTE — Progress Notes (Signed)
PROGRESS NOTE    Laurie Olson  PRF:[id number] DOB: 01-16-1961 DOA: 07/12/2021 PCP: Kerri Perches, MD    Brief Narrative:  Laurie Olson is a 61 y.o. female with past medical history significant for cholelithiasis, hypertension presented to the hospital with right upper quadrant abdominal pain for 1 month.  Patient was seeing GI and general surgery as outpatient.  Due to worsening abdominal pain, patient decided to come to the hospital.  She was scheduled to have MRCP as outpatient due to elevated LFTs but in the ED patient was noted to have bilirubin of 10.   Transaminases elevated near baseline at 189/212 for AST/ALT respectively.  Alkaline phosphatase elevated at 368.  WBC count was normal.  She was afebrile and vitals were otherwise stable.  UA showed 21-50 WBCs and many bacteria.  Patient was then considered for admission to the hospital for further evaluation and treatment  Assessment and plan.  Principal Problem:   Gallstone pancreatitis Active Problems:   Essential hypertension   Elevated liver function tests   Obstructive jaundice   Pancreatic lesion   Obstructive jaundice   MRCP done on 07/12/2021 showed choledocholithiasis with 1 cm gallstone in the distal common bile duct resulting in biliary duct obstruction with marked biliary dilatation/pancreatic duct dilatation.  Gallbladder was distended without signs of inflammation.  GI and general surgery was consulted.  Patient underwent ERCP 07/13/2021 with findings of large bulging ampulla with ulceration and dilated bile duct with a distal filling defect due to stone.  Biliary sphincterectomy was done with damia basketing and removal of stones and placement of biliary stent.  Trending down LFTs at this time.  Plans for cholecystectomy 07/15/2021.  Currently on ciprofloxacin.  On clear liquids at this time.  Continue IV fluids.  Acute gallstone pancreatitis -Lipase of 2081 on presentation.  Lipase has trended down to 44.  Denies  overt pain.  On clears at this time.  Plan for surgical intervention on 07/15/2021  Hypertension Home medication consists of metoprolol and amlodipine.  Currently on as needed hydralazine.   Possible UTI Urinalysis with 21-50 white cells.  Urine culture with more than 100,000 gram-negative rods.  Currently on ciprofloxacin which should cover this.   GERD Continue PPI.      DVT prophylaxis: SCDs Start: 07/12/21 0749   Code Status:     Code Status: Full Code  Disposition: Home likely in 2 to 3 days  Status is: Inpatient  Remains inpatient appropriate because: ERCP, plan for cholecystectomy on 07/15/2021   Family Communication:  Spoke with patient's mother at bedside.  Consultants:  GI,  general surgery  Procedures:  ERCP with sphincterectomy and Damia basketing with balloon extraction on 07/13/2021 with placement of biliary stent  Antimicrobials:  Ciprofloxacin  Anti-infectives (From admission, onward)    Start     Dose/Rate Route Frequency Ordered Stop   07/13/21 0800  ciprofloxacin (CIPRO) IVPB 400 mg        400 mg 200 mL/hr over 60 Minutes Intravenous Every 12 hours 07/12/21 1307     07/12/21 0830  cefTRIAXone (ROCEPHIN) 2 g in sodium chloride 0.9 % 100 mL IVPB  Status:  Discontinued        2 g 200 mL/hr over 30 Minutes Intravenous Every 24 hours 07/12/21 0810 07/12/21 1307      Subjective: Today, patient was seen and examined at bedside.  Denies any nausea vomiting abdominal pain.  Has not had a bowel movement.   Objective: Vitals:   07/13/21  0900 07/13/21 1437 07/13/21 2140 07/14/21 0330  BP: (!) 115/51 138/77 125/73 (!) 143/77  Pulse: 82 72 65 62  Resp: (!) 25  18 18   Temp: 98.5 F (36.9 C) 98.2 F (36.8 C) 98.1 F (36.7 C) 98.2 F (36.8 C)  TempSrc:  Oral    SpO2: 100% 99% 98% 100%  Weight:      Height:        Intake/Output Summary (Last 24 hours) at 07/14/2021 0948 Last data filed at 07/13/2021 1700 Gross per 24 hour  Intake 549.73 ml  Output  --  Net 549.73 ml   Filed Weights   07/12/21 0158 07/12/21 0938  Weight: 85.3 kg 79.9 kg    Physical Examination: Body mass index is 28.43 kg/m.   General:  Average built, not in obvious distress HENT:   Mild icterus noted.  Oral mucosa is moist.  Chest:  Clear breath sounds.  Diminished breath sounds bilaterally. No crackles or wheezes.  CVS: S1 &S2 heard. No murmur.  Regular rate and rhythm. Abdomen: Soft, nontender, nondistended.  Bowel sounds are heard.   Extremities: No cyanosis, clubbing or edema.  Peripheral pulses are palpable. Psych: Alert, awake and oriented, normal mood CNS:  No cranial nerve deficits.  Power equal in all extremities.   Skin: Warm and dry.  No rashes noted.   Data Reviewed:   CBC: Recent Labs  Lab 07/12/21 0246 07/13/21 0604 07/14/21 0605  WBC 6.6 9.7 11.0*  HGB 13.1 12.7 11.7*  HCT 39.1 38.5 36.0  MCV 92.4 92.3 91.8  PLT 269 240 216    Basic Metabolic Panel: Recent Labs  Lab 07/12/21 0246 07/13/21 0604 07/14/21 0605  NA 136 135 137  K 3.6 3.4* 3.1*  CL 107 103 107  CO2 24 25 23   GLUCOSE 110* 97 112*  BUN 13 10 14   CREATININE 0.75 0.54 0.71  CALCIUM 10.7* 10.4* 10.2  MG  --   --  1.9    Liver Function Tests: Recent Labs  Lab 07/12/21 0246 07/13/21 0604 07/14/21 0605  AST 189* 130* 55*  ALT 212* 183* 117*  ALKPHOS 368* 327* 260*  BILITOT 10.0* 12.7* 3.8*  PROT 7.6 7.0 6.4*  ALBUMIN 3.5 3.1* 2.7*     Radiology Studies: DG ERCP  Result Date: 07/13/2021 CLINICAL DATA:  Choledocholithiasis, biliary sphincterotomy with stent placement EXAM: ERCP TECHNIQUE: Multiple spot images obtained with the fluoroscopic device and submitted for interpretation post-procedure. COMPARISON:  None Available. FINDINGS: Images document endoscopic cannulation And opacification of the CBD, passage of basket through the CBD, passage of balloon catheter through the CBD and subsequent placement of plastic biliary stent. There is incomplete  opacification of the intrahepatic biliary tree, which appears mildly distended centrally. No extravasation identified. IMPRESSION: Endoscopic CBD cannulation and intervention with stent placement. These images were submitted for radiologic interpretation only. Please see the procedural report for the amount of contrast and the fluoroscopy time utilized. Electronically Signed   By: 07/15/21 M.D.   On: 07/13/2021 08:59      LOS: 2 days    07/15/2021, MD [facility] Available via Epic secure chat 7am-7pm After these hours, please refer to coverage provider listed on [email or web address] 07/14/2021, 9:48 AM

## 2021-07-15 ENCOUNTER — Encounter (HOSPITAL_COMMUNITY): Payer: Self-pay | Admitting: Family Medicine

## 2021-07-15 ENCOUNTER — Inpatient Hospital Stay (HOSPITAL_COMMUNITY): Payer: BC Managed Care – PPO | Admitting: Anesthesiology

## 2021-07-15 ENCOUNTER — Encounter (HOSPITAL_COMMUNITY)
Admission: EM | Disposition: A | Discharge status: Home / Self Care | Payer: Self-pay | Source: Home / Self Care | Attending: Internal Medicine

## 2021-07-15 ENCOUNTER — Inpatient Hospital Stay (HOSPITAL_COMMUNITY): Payer: BC Managed Care – PPO

## 2021-07-15 ENCOUNTER — Other Ambulatory Visit: Payer: Self-pay

## 2021-07-15 DIAGNOSIS — E876 Hypokalemia: Secondary | ICD-10-CM

## 2021-07-15 DIAGNOSIS — K8050 Calculus of bile duct without cholangitis or cholecystitis without obstruction: Secondary | ICD-10-CM

## 2021-07-15 DIAGNOSIS — K831 Obstruction of bile duct: Secondary | ICD-10-CM | POA: Diagnosis not present

## 2021-07-15 DIAGNOSIS — I10 Essential (primary) hypertension: Secondary | ICD-10-CM | POA: Diagnosis not present

## 2021-07-15 DIAGNOSIS — K8309 Other cholangitis: Secondary | ICD-10-CM | POA: Diagnosis not present

## 2021-07-15 DIAGNOSIS — K851 Biliary acute pancreatitis without necrosis or infection: Secondary | ICD-10-CM | POA: Diagnosis not present

## 2021-07-15 DIAGNOSIS — R7989 Other specified abnormal findings of blood chemistry: Secondary | ICD-10-CM | POA: Diagnosis not present

## 2021-07-15 HISTORY — PX: INTRAOPERATIVE CHOLANGIOGRAM: SHX5230

## 2021-07-15 HISTORY — PX: CHOLECYSTECTOMY: SHX55

## 2021-07-15 LAB — CBC
HCT: 38 % (ref 36.0–46.0)
Hemoglobin: 12.7 g/dL (ref 12.0–15.0)
MCH: 30.5 pg (ref 26.0–34.0)
MCHC: 33.4 g/dL (ref 30.0–36.0)
MCV: 91.3 fL (ref 80.0–100.0)
Platelets: 271 10*3/uL (ref 150–400)
RBC: 4.16 MIL/uL (ref 3.87–5.11)
RDW: 14.2 % (ref 11.5–15.5)
WBC: 8.2 10*3/uL (ref 4.0–10.5)
nRBC: 0 % (ref 0.0–0.2)

## 2021-07-15 LAB — COMPREHENSIVE METABOLIC PANEL
ALT: 106 U/L — ABNORMAL HIGH (ref 0–44)
AST: 49 U/L — ABNORMAL HIGH (ref 15–41)
Albumin: 3 g/dL — ABNORMAL LOW (ref 3.5–5.0)
Alkaline Phosphatase: 264 U/L — ABNORMAL HIGH (ref 38–126)
Anion gap: 7 (ref 5–15)
BUN: 12 mg/dL (ref 8–23)
CO2: 25 mmol/L (ref 22–32)
Calcium: 10.3 mg/dL (ref 8.9–10.3)
Chloride: 107 mmol/L (ref 98–111)
Creatinine, Ser: 0.68 mg/dL (ref 0.44–1.00)
GFR, Estimated: >60 mL/min (ref >60)
Glucose, Bld: 97 mg/dL (ref 70–99)
Potassium: 3.1 mmol/L — ABNORMAL LOW (ref 3.5–5.1)
Sodium: 139 mmol/L (ref 135–145)
Total Bilirubin: 3.3 mg/dL — ABNORMAL HIGH (ref 0.3–1.2)
Total Protein: 7.1 g/dL (ref 6.5–8.1)

## 2021-07-15 LAB — MAGNESIUM: Magnesium: 1.9 mg/dL (ref 1.7–2.4)

## 2021-07-15 SURGERY — LAPAROSCOPIC CHOLECYSTECTOMY WITH INTRAOPERATIVE CHOLANGIOGRAM
Anesthesia: General | Site: Abdomen

## 2021-07-15 MED ORDER — DEXAMETHASONE SODIUM PHOSPHATE 10 MG/ML IJ SOLN
INTRAMUSCULAR | Status: DC | PRN
Start: 2021-07-15 — End: 2021-07-15
  Administered 2021-07-15: 10 mg via INTRAVENOUS

## 2021-07-15 MED ORDER — NEOSTIGMINE METHYLSULFATE 3 MG/3ML IV SOSY
PREFILLED_SYRINGE | INTRAVENOUS | Status: AC
  Filled 2021-07-15: qty 6

## 2021-07-15 MED ORDER — METOPROLOL TARTRATE 5 MG/5ML IV SOLN: 2.5000 mg | INTRAVENOUS | Status: DC | PRN

## 2021-07-15 MED ORDER — FENTANYL CITRATE (PF) 100 MCG/2ML IJ SOLN
INTRAMUSCULAR | Status: DC | PRN
  Administered 2021-07-15: 100 ug via INTRAVENOUS

## 2021-07-15 MED ORDER — ORAL CARE MOUTH RINSE: 15.0000 mL | Freq: Once | OROMUCOSAL | Status: DC

## 2021-07-15 MED ORDER — LACTATED RINGERS IV SOLN: INTRAVENOUS | Status: DC

## 2021-07-15 MED ORDER — PHENYLEPHRINE 80 MCG/ML (10ML) SYRINGE FOR IV PUSH (FOR BLOOD PRESSURE SUPPORT)
PREFILLED_SYRINGE | INTRAVENOUS | Status: DC | PRN
  Administered 2021-07-15 (×3): 80 ug via INTRAVENOUS

## 2021-07-15 MED ORDER — POTASSIUM CHLORIDE 20 MEQ PO PACK
40.0000 meq | PACK | Freq: Two times a day (BID) | ORAL | Status: DC
  Administered 2021-07-15: 40 meq via ORAL
  Filled 2021-07-15: qty 2

## 2021-07-15 MED ORDER — ORAL CARE MOUTH RINSE: 15.0000 mL | Freq: Once | OROMUCOSAL | Status: AC

## 2021-07-15 MED ORDER — MEPERIDINE HCL 50 MG/ML IJ SOLN: 6.2500 mg | INTRAMUSCULAR | Status: DC | PRN

## 2021-07-15 MED ORDER — LIDOCAINE HCL (CARDIAC) PF 100 MG/5ML IV SOSY
PREFILLED_SYRINGE | INTRAVENOUS | Status: DC | PRN
  Administered 2021-07-15: 50 mg via INTRATRACHEAL

## 2021-07-15 MED ORDER — 0.9 % SODIUM CHLORIDE (POUR BTL) OPTIME
TOPICAL | Status: DC | PRN
  Administered 2021-07-15: 1000 mL

## 2021-07-15 MED ORDER — LIDOCAINE HCL (PF) 2 % IJ SOLN
INTRAMUSCULAR | Status: AC
  Filled 2021-07-15: qty 5

## 2021-07-15 MED ORDER — KETOROLAC TROMETHAMINE 30 MG/ML IJ SOLN
INTRAMUSCULAR | Status: AC
  Filled 2021-07-15: qty 1

## 2021-07-15 MED ORDER — PROPOFOL 10 MG/ML IV BOLUS
INTRAVENOUS | Status: DC | PRN
  Administered 2021-07-15: 100 mg via INTRAVENOUS

## 2021-07-15 MED ORDER — CHLORHEXIDINE GLUCONATE 0.12 % MT SOLN: 15.0000 mL | Freq: Once | OROMUCOSAL | Status: DC

## 2021-07-15 MED ORDER — MIDAZOLAM HCL 2 MG/2ML IJ SOLN
INTRAMUSCULAR | Status: DC | PRN
  Administered 2021-07-15: 2 mg via INTRAVENOUS

## 2021-07-15 MED ORDER — CHLORHEXIDINE GLUCONATE 0.12 % MT SOLN
15.0000 mL | Freq: Once | OROMUCOSAL | Status: AC
  Administered 2021-07-15: 15 mL via OROMUCOSAL

## 2021-07-15 MED ORDER — MIDAZOLAM HCL 2 MG/2ML IJ SOLN
INTRAMUSCULAR | Status: AC
  Administered 2021-07-15: 1 mg via INTRAVENOUS
  Filled 2021-07-15: qty 2

## 2021-07-15 MED ORDER — BUPIVACAINE HCL (PF) 0.5 % IJ SOLN
INTRAMUSCULAR | Status: AC
  Filled 2021-07-15: qty 30

## 2021-07-15 MED ORDER — BUPIVACAINE HCL (PF) 0.5 % IJ SOLN
INTRAMUSCULAR | Status: DC | PRN
  Administered 2021-07-15: 10 mL

## 2021-07-15 MED ORDER — EPHEDRINE 5 MG/ML INJ
INTRAVENOUS | Status: AC
  Filled 2021-07-15: qty 5

## 2021-07-15 MED ORDER — HYDROMORPHONE HCL 1 MG/ML IJ SOLN
0.2500 mg | INTRAMUSCULAR | Status: DC | PRN
  Administered 2021-07-15 (×2): 0.5 mg via INTRAVENOUS
  Filled 2021-07-15 (×2): qty 0.5

## 2021-07-15 MED ORDER — SUCCINYLCHOLINE CHLORIDE 200 MG/10ML IV SOSY
PREFILLED_SYRINGE | INTRAVENOUS | Status: DC | PRN
Start: 2021-07-15 — End: 2021-07-15
  Administered 2021-07-15: 100 mg via INTRAVENOUS

## 2021-07-15 MED ORDER — POTASSIUM CHLORIDE CRYS ER 20 MEQ PO TBCR
40.0000 meq | EXTENDED_RELEASE_TABLET | Freq: Once | ORAL | Status: AC
Start: 2021-07-16 — End: 2021-07-16
  Administered 2021-07-16: 40 meq via ORAL
  Filled 2021-07-15: qty 2

## 2021-07-15 MED ORDER — METOPROLOL TARTRATE 5 MG/5ML IV SOLN
INTRAVENOUS | Status: AC
  Administered 2021-07-15: 2.5 mg via INTRAVENOUS
  Filled 2021-07-15: qty 5

## 2021-07-15 MED ORDER — MIDAZOLAM HCL 2 MG/2ML IJ SOLN: 2.0000 mg | Freq: Once | INTRAMUSCULAR | Status: AC

## 2021-07-15 MED ORDER — FENTANYL CITRATE (PF) 250 MCG/5ML IJ SOLN
INTRAMUSCULAR | Status: AC
  Filled 2021-07-15: qty 5

## 2021-07-15 MED ORDER — PROPOFOL 10 MG/ML IV BOLUS
INTRAVENOUS | Status: AC
  Filled 2021-07-15: qty 20

## 2021-07-15 MED ORDER — PHENYLEPHRINE HCL-NACL 20-0.9 MG/250ML-% IV SOLN: INTRAVENOUS | Status: DC | PRN

## 2021-07-15 MED ORDER — PHENYLEPHRINE 80 MCG/ML (10ML) SYRINGE FOR IV PUSH (FOR BLOOD PRESSURE SUPPORT)
PREFILLED_SYRINGE | INTRAVENOUS | Status: AC
  Filled 2021-07-15: qty 10

## 2021-07-15 MED ORDER — METOCLOPRAMIDE HCL 5 MG/ML IJ SOLN
INTRAMUSCULAR | Status: DC | PRN
  Administered 2021-07-15: 10 mg via INTRAVENOUS

## 2021-07-15 MED ORDER — GLYCOPYRROLATE PF 0.2 MG/ML IJ SOSY
PREFILLED_SYRINGE | INTRAMUSCULAR | Status: AC
  Filled 2021-07-15: qty 4

## 2021-07-15 MED ORDER — SODIUM CHLORIDE 0.9 % IV SOLN
INTRAVENOUS | Status: AC | PRN
  Administered 2021-07-15: 500 mL

## 2021-07-15 MED ORDER — EPHEDRINE SULFATE (PRESSORS) 50 MG/ML IJ SOLN
INTRAMUSCULAR | Status: DC | PRN
  Administered 2021-07-15: 15 mg via INTRAVENOUS
  Administered 2021-07-15: 5 mg via INTRAVENOUS

## 2021-07-15 MED ORDER — ROCURONIUM BROMIDE 10 MG/ML (PF) SYRINGE
PREFILLED_SYRINGE | INTRAVENOUS | Status: DC | PRN
  Administered 2021-07-15: 30 mg via INTRAVENOUS

## 2021-07-15 MED ORDER — METOCLOPRAMIDE HCL 5 MG/ML IJ SOLN
INTRAMUSCULAR | Status: AC
  Filled 2021-07-15: qty 2

## 2021-07-15 MED ORDER — KETOROLAC TROMETHAMINE 30 MG/ML IJ SOLN
INTRAMUSCULAR | Status: DC | PRN
  Administered 2021-07-15: 30 mg via INTRAVENOUS

## 2021-07-15 MED ORDER — MIDAZOLAM HCL 2 MG/2ML IJ SOLN
INTRAMUSCULAR | Status: AC
  Filled 2021-07-15: qty 2

## 2021-07-15 MED ORDER — ONDANSETRON HCL 4 MG/2ML IJ SOLN
INTRAMUSCULAR | Status: AC
Start: 2021-07-15 — End: ?
  Filled 2021-07-15: qty 2

## 2021-07-15 MED ORDER — HEMOSTATIC AGENTS (NO CHARGE) OPTIME
TOPICAL | Status: DC | PRN
Start: 2021-07-15 — End: 2021-07-15
  Administered 2021-07-15: 1 via TOPICAL

## 2021-07-15 MED ORDER — ONDANSETRON HCL 4 MG/2ML IJ SOLN
INTRAMUSCULAR | Status: DC | PRN
  Administered 2021-07-15: 4 mg via INTRAVENOUS

## 2021-07-15 SURGICAL SUPPLY — 48 items
ADH SKN CLS APL DERMABOND .7 (GAUZE/BANDAGES/DRESSINGS) ×2
APL PRP STRL LF DISP 70% ISPRP (MISCELLANEOUS) ×2
APPLIER CLIP ROT 10 11.4 M/L (STAPLE) ×3
APR CLP MED LRG 11.4X10 (STAPLE) ×2
BAG DECANTER FOR FLEXI CONT (MISCELLANEOUS) ×3 IMPLANT
BAG RETRIEVAL 10 (BASKET) ×1
BLADE SURG 15 STRL LF DISP TIS (BLADE) ×2 IMPLANT
BLADE SURG 15 STRL SS (BLADE) ×3
CATH CHOLANGIOGRAM 4.5FR (CATHETERS) ×1 IMPLANT
CHLORAPREP W/TINT 26 (MISCELLANEOUS) ×3 IMPLANT
CLIP APPLIE ROT 10 11.4 M/L (STAPLE) ×2 IMPLANT
CLOTH BEACON ORANGE TIMEOUT ST (SAFETY) ×3 IMPLANT
COVER LIGHT HANDLE STERIS (MISCELLANEOUS) ×6 IMPLANT
DERMABOND ADVANCED (GAUZE/BANDAGES/DRESSINGS) ×1
DERMABOND ADVANCED .7 DNX12 (GAUZE/BANDAGES/DRESSINGS) ×2 IMPLANT
DRAPE C-ARM FOLDED MOBILE STRL (DRAPES) ×3 IMPLANT
ELECT REM PT RETURN 9FT ADLT (ELECTROSURGICAL) ×3
ELECTRODE REM PT RTRN 9FT ADLT (ELECTROSURGICAL) ×2 IMPLANT
GLOVE BIO SURGEON STRL SZ 6.5 (GLOVE) ×3 IMPLANT
GLOVE BIOGEL PI IND STRL 6.5 (GLOVE) ×2 IMPLANT
GLOVE BIOGEL PI IND STRL 7.0 (GLOVE) ×4 IMPLANT
GLOVE BIOGEL PI INDICATOR 6.5 (GLOVE) ×1
GLOVE BIOGEL PI INDICATOR 7.0 (GLOVE) ×2
GOWN STRL REUS W/TWL LRG LVL3 (GOWN DISPOSABLE) ×9 IMPLANT
HEMOSTAT SNOW SURGICEL 2X4 (HEMOSTASIS) ×3 IMPLANT
INST SET LAPROSCOPIC AP (KITS) ×3 IMPLANT
IV NS 500ML (IV SOLUTION) ×3
IV NS 500ML BAXH (IV SOLUTION) ×2 IMPLANT
KIT TURNOVER KIT A (KITS) ×3 IMPLANT
MANIFOLD NEPTUNE II (INSTRUMENTS) ×3 IMPLANT
NEEDLE INSUFFLATION 120MM (ENDOMECHANICALS) ×3 IMPLANT
NS IRRIG 1000ML POUR BTL (IV SOLUTION) ×3 IMPLANT
PACK LAP CHOLE LZT030E (CUSTOM PROCEDURE TRAY) ×3 IMPLANT
PAD ARMBOARD 7.5X6 YLW CONV (MISCELLANEOUS) ×3 IMPLANT
SET BASIN LINEN APH (SET/KITS/TRAYS/PACK) ×3 IMPLANT
SET TUBE SMOKE EVAC HIGH FLOW (TUBING) ×3 IMPLANT
SLEEVE ENDOPATH XCEL 5M (ENDOMECHANICALS) ×3 IMPLANT
SUT MNCRL AB 4-0 PS2 18 (SUTURE) ×6 IMPLANT
SUT VICRYL 0 UR6 27IN ABS (SUTURE) ×3 IMPLANT
SYR 20ML LL LF (SYRINGE) ×3 IMPLANT
SYR CONTROL 10ML LL (SYRINGE) ×3 IMPLANT
SYS BAG RETRIEVAL 10MM (BASKET) ×2
SYSTEM BAG RETRIEVAL 10MM (BASKET) ×2 IMPLANT
TROCAR ENDO BLADELESS 11MM (ENDOMECHANICALS) ×3 IMPLANT
TROCAR XCEL NON-BLD 5MMX100MML (ENDOMECHANICALS) ×3 IMPLANT
TROCAR XCEL UNIV SLVE 11M 100M (ENDOMECHANICALS) ×3 IMPLANT
TUBE CONNECTING 12X1/4 (SUCTIONS) ×3 IMPLANT
WARMER LAPAROSCOPE (MISCELLANEOUS) ×3 IMPLANT

## 2021-07-15 NOTE — Anesthesia Postprocedure Evaluation (Signed)
Anesthesia Post Note  Patient: Garry D Muska  Procedure(s) Performed: LAPAROSCOPIC CHOLECYSTECTOMY (Abdomen) ATTEMPTED INTRAOPERATIVE CHOLANGIOGRAM  Patient location during evaluation: PACU Anesthesia Type: General Level of consciousness: awake and alert and oriented Pain management: pain level controlled Vital Signs Assessment: post-procedure vital signs reviewed and stable Respiratory status: spontaneous breathing, nonlabored ventilation and respiratory function stable Cardiovascular status: blood pressure returned to baseline and stable Postop Assessment: no apparent nausea or vomiting Anesthetic complications: no   No notable events documented.   Last Vitals:  Vitals:   07/15/21 0930 07/15/21 1008  BP: 114/74 128/82  Pulse: 94 76  Resp: 17 18  Temp:  36.7 C  SpO2: 99% 100%    Last Pain:  Vitals:   07/15/21 0930  TempSrc:   PainSc: Asleep                 Idus Rathke C Tyishia Aune

## 2021-07-15 NOTE — Progress Notes (Addendum)
PROGRESS NOTE    Laurie Olson  [id number] DOB: 1960-11-13 DOA: 07/12/2021 PCP: Fayrene Helper, MD    Brief Narrative:  Laurie Olson is a 61 y.o. female with past medical history significant for cholelithiasis, hypertension presented to the hospital with right upper quadrant abdominal pain for 1 month.  Patient was seeing GI and general surgery as outpatient.  Due to worsening abdominal pain, patient decided to come to the hospital.  She was scheduled to have MRCP as outpatient due to elevated LFTs but in the ED patient was noted to have bilirubin of 10.   Transaminases elevated near baseline at 189/212 for AST/ALT respectively.  Alkaline phosphatase elevated at 368.  WBC count was normal.  She was afebrile and vitals were otherwise stable.  UA showed 21-50 WBCs and many bacteria.  Patient was then considered for admission to the hospital for further evaluation and treatment  Assessment and plan.  Principal Problem:   Gallstone pancreatitis Active Problems:   Essential hypertension   Elevated liver function tests   Obstructive jaundice   Pancreatic lesion   Choledocholithiasis   Hypokalemia   Obstructive jaundice MRCP done on 07/12/2021 showed choledocholithiasis with 1 cm gallstone in the distal common bile duct resulting in biliary duct obstruction with marked biliary dilatation/pancreatic duct dilatation.  Gallbladder was distended without signs of inflammation.  GI and general surgery was consulted.  Patient underwent ERCP 07/13/2021 with findings of large bulging ampulla with ulceration and dilated bile duct with a distal filling defect due to stone.  Biliary sphincterectomy was done with damia basketing and removal of stones and placement of biliary stent.  Trending down LFTs at this time.  Continue ciprofloxacin. Status post  cholecystectomy 07/15/2021.  Has been started on full liquid diet.  Acute gallstone pancreatitis -Lipase of 2081 on presentation.  Lipase has trended  down to 44.  Status postcholecystectomy on 07/15/2021.  On full liquids.  Hypokalemia. Potassium 3.1 today.  We will replenish orally.  Check levels in a.m.  Hypertension Home medication consists of metoprolol and amlodipine.  Currently on as needed hydralazine.  Blood pressure stable at this time.   Possible UTI Urinalysis with 21-50 white cells.  Urine culture with more than 100,000 gram-negative rods.  Currently on ciprofloxacin which should cover this.   GERD Continue PPI.      DVT prophylaxis: SCDs Start: 07/12/21 0749   Code Status:     Code Status: Full Code  Disposition: Home likely on 07/16/2021 if okay with surgery.  Status is: Inpatient  Remains inpatient appropriate because: ERCP, status post cholecystectomy on 07/15/2021   Family Communication:  Spoke with patient's mother and sister at bedside  Consultants:  GI,  general surgery  Procedures:  ERCP with sphincterectomy and Damia basketing with balloon extraction on 07/13/2021 with placement of biliary stent Laparoscopic cholecystectomy on 07/15/2021  Antimicrobials:  Ciprofloxacin 5/19  Anti-infectives (From admission, onward)    Start     Dose/Rate Route Frequency Ordered Stop   07/15/21 2000  ciprofloxacin (CIPRO) IVPB 400 mg  Status:  Discontinued        400 mg 200 mL/hr over 60 Minutes Intravenous Every 12 hours 07/14/21 1409 07/14/21 1410   07/15/21 0600  ciprofloxacin (CIPRO) IVPB 400 mg        400 mg 200 mL/hr over 60 Minutes Intravenous On call to O.R. 07/14/21 1348 07/15/21 0749   07/13/21 0800  ciprofloxacin (CIPRO) IVPB 400 mg        400 mg  200 mL/hr over 60 Minutes Intravenous Every 12 hours 07/12/21 1307 07/14/21 2106   07/12/21 0830  cefTRIAXone (ROCEPHIN) 2 g in sodium chloride 0.9 % 100 mL IVPB  Status:  Discontinued        2 g 200 mL/hr over 30 Minutes Intravenous Every 24 hours 07/12/21 0810 07/12/21 1307      Subjective: Today, patient was seen and examined at bedside.  Seen after  laparoscopic cholecystectomy.  Denies any nausea vomiting abdominal pain at this time.  Feels mildly sleepy   Objective: Vitals:   07/15/21 0900 07/15/21 0916 07/15/21 0930 07/15/21 1008  BP: 114/70 119/74 114/74 128/82  Pulse: 69 82 94 76  Resp: 19 16 17 18   Temp:    98 F (36.7 C)  TempSrc:      SpO2: 100% 100% 99% 100%  Weight:      Height:        Intake/Output Summary (Last 24 hours) at 07/15/2021 1240 Last data filed at 07/15/2021 0930 Gross per 24 hour  Intake 1091.67 ml  Output 25 ml  Net 1066.67 ml   Filed Weights   07/12/21 0158 07/12/21 0938  Weight: 85.3 kg 79.9 kg    Physical Examination: Body mass index is 28.43 kg/m.   General:  Average built, not in obvious distress HENT:   Mild icterus noted. Oral mucosa is moist.  Chest:  Clear breath sounds.  Diminished breath sounds bilaterally. No crackles or wheezes.  CVS: S1 &S2 heard. No murmur.  Regular rate and rhythm. Abdomen: Soft, status post laparoscopic cholecystectomy scar. Extremities: No cyanosis, clubbing or edema.  Peripheral pulses are palpable. Psych: Alert, awake and oriented, normal mood CNS:  No cranial nerve deficits.  Power equal in all extremities.   Skin: Warm and dry.  No rashes noted.  Data Reviewed:   CBC: Recent Labs  Lab 07/12/21 0246 07/13/21 0604 07/14/21 0605 07/15/21 0523  WBC 6.6 9.7 11.0* 8.2  HGB 13.1 12.7 11.7* 12.7  HCT 39.1 38.5 36.0 38.0  MCV 92.4 92.3 91.8 91.3  PLT 269 240 216 [id number]    Basic Metabolic Panel: Recent Labs  Lab 07/12/21 0246 07/13/21 0604 07/14/21 0605 07/15/21 0523  NA 136 135 137 139  K 3.6 3.4* 3.1* 3.1*  CL 107 103 107 107  CO2 24 25 23 25   GLUCOSE 110* 97 112* 97  BUN 13 10 14 12   CREATININE 0.75 0.54 0.71 0.68  CALCIUM 10.7* 10.4* 10.2 10.3  MG  --   --  1.9 1.9    Liver Function Tests: Recent Labs  Lab 07/12/21 0246 07/13/21 0604 07/14/21 0605 07/15/21 0523  AST 189* 130* 55* 49*  ALT 212* 183* 117* 106*  ALKPHOS 368*  327* 260* 264*  BILITOT 10.0* 12.7* 3.8* 3.3*  PROT 7.6 7.0 6.4* 7.1  ALBUMIN 3.5 3.1* 2.7* 3.0*     Radiology Studies: DG C-Arm 1-60 Min-No Report  Result Date: 07/15/2021 Fluoroscopy was utilized by the requesting physician.  No radiographic interpretation.      LOS: 3 days    Flora Lipps, MD [facility] Available via Epic secure chat 7am-7pm After these hours, please refer to coverage provider listed on [email or web address] 07/15/2021, 12:40 PM

## 2021-07-15 NOTE — Progress Notes (Signed)
Blood pressure 182/93; 10mg  of Hydralazine given IV

## 2021-07-15 NOTE — Interval H&P Note (Signed)
History and Physical Interval Note:  07/15/2021 7:28 AM  Laurie Olson  has presented today for surgery, with the diagnosis of gallstone pancreatitis, choledocolithiasis.  The various methods of treatment have been discussed with the patient and family. After consideration of risks, benefits and other options for treatment, the patient has consented to  Procedure(s): LAPAROSCOPIC CHOLECYSTECTOMY WITH INTRAOPERATIVE CHOLANGIOGRAM (N/A) as a surgical intervention.  The patient's history has been reviewed, patient examined, no change in status, stable for surgery.  I have reviewed the patient's chart and labs.  Questions were answered to the patient's satisfaction.     Virl Cagey

## 2021-07-15 NOTE — Progress Notes (Signed)
No complaints of pain or nausea.  Sites still dry and intact.  Family at bedside and has been walking to bathroom

## 2021-07-15 NOTE — Progress Notes (Signed)
Assisted back to bed and requested oxycodone for pain rated 7

## 2021-07-15 NOTE — Transfer of Care (Signed)
Immediate Anesthesia Transfer of Care Note  Patient: Laurie Olson  Procedure(s) Performed: LAPAROSCOPIC CHOLECYSTECTOMY (Abdomen) ATTEMPTED INTRAOPERATIVE CHOLANGIOGRAM  Patient Location: PACU  Anesthesia Type:General  Level of Consciousness: awake  Airway & Oxygen Therapy: Patient Spontanous Breathing  Post-op Assessment: Report given to RN  Post vital signs: stable  Last Vitals:  Vitals Value Taken Time  BP 116/89 07/15/21 0849  Temp 36.6 C 07/15/21 0849  Pulse 80 07/15/21 0853  Resp 22 07/15/21 0853  SpO2 100 % 07/15/21 0853  Vitals shown include unvalidated device data.  Last Pain:  Vitals:   07/15/21 0715  TempSrc:   PainSc: Asleep         Complications: No notable events documented.

## 2021-07-15 NOTE — Anesthesia Preprocedure Evaluation (Addendum)
Anesthesia Evaluation  Patient identified by MRN, date of birth, ID band Patient awake    Reviewed: Allergy & Precautions, NPO status , Patient's Chart, lab work & pertinent test results, reviewed documented beta blocker date and time   Airway Mallampati: II  TM Distance: >3 FB Neck ROM: Full   Comment: Cervical pain, disc disease Dental  (+) Dental Advisory Given, Missing   Pulmonary former smoker,    Pulmonary exam normal breath sounds clear to auscultation       Cardiovascular Exercise Tolerance: Good hypertension, Pt. on medications and Pt. on home beta blockers  Rhythm:Regular Rate:Tachycardia     Neuro/Psych negative neurological ROS  negative psych ROS   GI/Hepatic GERD  Medicated and Controlled,Biliary stones, obstructive jaundice   Endo/Other  negative endocrine ROS  Renal/GU negative Renal ROS  negative genitourinary   Musculoskeletal negative musculoskeletal ROS (+)   Abdominal   Peds negative pediatric ROS (+)  Hematology  (+) Blood dyscrasia, anemia ,   Anesthesia Other Findings   Reproductive/Obstetrics negative OB ROS                             Anesthesia Physical  Anesthesia Plan  ASA: 2  Anesthesia Plan: General   Post-op Pain Management: Dilaudid IV   Induction: Intravenous  PONV Risk Score and Plan: Propofol infusion  Airway Management Planned: Oral ETT  Additional Equipment:   Intra-op Plan:   Post-operative Plan: Extubation in OR  Informed Consent: I have reviewed the patients History and Physical, chart, labs and discussed the procedure including the risks, benefits and alternatives for the proposed anesthesia with the patient or authorized representative who has indicated his/her understanding and acceptance.     Dental advisory given  Plan Discussed with: CRNA and Surgeon  Anesthesia Plan Comments: (Patient tachycardic, metoprolol 2.5 mg x2  PRN heart rate >90, midazolam 2mg  for anxiety will be given and reevaluate before the procedure, patient was on metoprolol at home and will discuss with Dr. .)        Anesthesia Quick Evaluation

## 2021-07-15 NOTE — Anesthesia Procedure Notes (Signed)
Procedure Name: Intubation Date/Time: 07/15/2021 7:49 AM Performed by: Alroy Dust, CRNA Pre-anesthesia Checklist: Patient identified, Emergency Drugs available, Suction available and Patient being monitored Patient Re-evaluated:Patient Re-evaluated prior to induction Oxygen Delivery Method: Circle system utilized Preoxygenation: Pre-oxygenation with 100% oxygen Induction Type: IV induction Ventilation: Mask ventilation without difficulty Laryngoscope Size: Mac and 3 Grade View: Grade II Tube type: Oral Number of attempts: 1 Airway Equipment and Method: Stylet and Oral airway Placement Confirmation: ETT inserted through vocal cords under direct vision, positive ETCO2 and breath sounds checked- equal and bilateral Secured at: 22 cm Tube secured with: Tape Dental Injury: Teeth and Oropharynx as per pre-operative assessment

## 2021-07-15 NOTE — Progress Notes (Signed)
[facility]  Patient's wife updated. Hopefully home tomorrow. Plan for LFTs in AM.   Diet as tolerated. PRN For pain.   Algis Greenhouse, MD [facility] [address] [place], [place] [address] [phone] (office)

## 2021-07-15 NOTE — Progress Notes (Signed)
Transferred back to room from [place] around 1000.  Alert and oriented.  Vitals stable and surgical lap sites dry and intact with glue. Stated that it hurt just a little.  Asked for ice cream and something to drink.  LR infusing .  Family at bedside.

## 2021-07-15 NOTE — Discharge Instructions (Signed)
Discharge Laparoscopic Surgery Instructions:  Common Complaints: Right shoulder pain is common after laparoscopic surgery. This is secondary to the gas used in the surgery being trapped under the diaphragm.  Walk to help your body absorb the gas. This will improve in a few days. Pain at the port sites are common, especially the larger port sites. This will improve with time.  Some nausea is common and poor appetite. The main goal is to stay hydrated the first few days after surgery.   Diet/ Activity: Diet as tolerated. You may not have an appetite, but it is important to stay hydrated. Drink 64 ounces of water a day. Your appetite will return with time.  Shower per your regular routine daily.  Do not take hot showers. Take warm showers that are less than 10 minutes. Rest and listen to your body, but do not remain in bed all day.  Walk everyday for at least 15-20 minutes. Deep cough and move around every 1-2 hours in the first few days after surgery.  Do not lift > 10 lbs, perform excessive bending, pushing, pulling, squatting for 1-2 weeks after surgery.  Do not pick at the dermabond glue on your incision sites.  This glue film will remain in place for 1-2 weeks and will start to peel off.  Do not place lotions or balms on your incision unless instructed to specifically by Dr. Bernita Beckstrom.   Pain Expectations and Narcotics: -After surgery you will have pain associated with your incisions and this is normal. The pain is muscular and nerve pain, and will get better with time. -You are encouraged and expected to take non narcotic medications like tylenol and ibuprofen (when able) to treat pain as multiple modalities can aid with pain treatment. -Narcotics are only used when pain is severe or there is breakthrough pain. -You are not expected to have a pain score of 0 after surgery, as we cannot prevent pain. A pain score of 3-4 that allows you to be functional, move, walk, and tolerate some activity is  the goal. The pain will continue to improve over the days after surgery and is dependent on your surgery. -Due to [place] law, we are only able to give a certain amount of pain medication to treat post operative pain, and we only give additional narcotics on a patient by patient basis.  -For most laparoscopic surgery, studies have shown that the majority of patients only need 10-15 narcotic pills, and for open surgeries most patients only need 15-20.   -Having appropriate expectations of pain and knowledge of pain management with non narcotics is important as we do not want anyone to become addicted to narcotic pain medication.  -Using ice packs in the first 48 hours and heating pads after 48 hours, wearing an abdominal binder (when recommended), and using over the counter medications are all ways to help with pain management.   -Simple acts like meditation and mindfulness practices after surgery can also help with pain control and research has proven the benefit of these practices.  Medication: Take tylenol and ibuprofen as needed for pain control, alternating every 4-6 hours.  Example:  Tylenol 1000mg @ 6am, 12noon, 6pm, 12midnight (Do not exceed 4000mg of tylenol a day). Ibuprofen 800mg @ 9am, 3pm, 9pm, 3am (Do not exceed 3600mg of ibuprofen a day).  Take Roxicodone for breakthrough pain every 4 hours.  Take Colace for constipation related to narcotic pain medication. If you do not have a bowel movement in 2 days, take Miralax   over the counter.  Drink plenty of water to also prevent constipation.   Contact Information: If you have questions or concerns, please call our office, [phone], Monday- Thursday 8AM-5PM and Friday 8AM-12Noon.  If it is after hours or on the weekend, please call [facility] Main Number, [phone], [phone], and ask to speak to the surgeon on call for Dr. Berkeley Vanaken at [facility].   

## 2021-07-15 NOTE — Progress Notes (Signed)
Has not asked for pain meds.  Sitting in chair now.  Ate all of cream soup for supper and wants regular tray for breakfast.  Will put in order.  Family at bedside.

## 2021-07-15 NOTE — Op Note (Addendum)
Operative Note   Preoperative Diagnosis: Gallstone pancreatitis, choledocholithiasis    Postoperative Diagnosis: Same   Procedure(s) Performed: Laparoscopic cholecystectomy attempted / unsuccessful intraoperative cholangiogram    Surgeon: Leatrice Jewels. Henreitta Leber, MD   Assistants: Franky Macho, MD    Anesthesia: General endotracheal   Anesthesiologist: Molli Barrows, MD    Specimens: Gallbladder    Estimated Blood Loss: Minimal    Blood Replacement: None    Complications: None    Operative Findings: Normal appearing gallbladder with torturous cystic duct but not signs of any Mirizzi's syndrome as the cystic duct and gallbladder were distinct from the common duct   Procedure: The patient was taken to the operating room and placed supine. General endotracheal anesthesia was induced. Intravenous antibiotics were administered per protocol. An orogastric tube positioned to decompress the stomach. The abdomen was prepared and draped in the usual sterile fashion.    A supraumbilical incision was made and a Veress technique was utilized to achieve pneumoperitoneum to 15 mmHg with carbon dioxide. A 11 mm optiview port was placed through the supraumbilical region, and a 10 mm 0-degree operative laparoscope was introduced. The area underlying the trocar and Veress needle were inspected and without evidence of injury.  Remaining trocars were placed under direct vision. Two 5 mm ports were placed in the right abdomen, between the anterior axillary and midclavicular line.  A final 11 mm port was placed through the mid-epigastrium, near the falciform ligament.    The gallbladder fundus was elevated cephalad and the infundibulum was retracted to the patient's right. The gallbladder/cystic duct junction was skeletonized. The cystic artery noted in the triangle of Calot and was also skeletonized.  We then continued liberal medial and lateral dissection until the critical view of safety was achieved.  The  gallbladder and cystic duct were separate distinct structures with no signs of fusion with the common bile duct.    The cystic artery and a posterior branch were doubly clipped and divided. The cystic duct was clipped distal and a ductotomy was made. A cholangiogram catheter and clamp was attempted multiple times to pass into the cystic duct but it was very torturous and we could not get the cholangiogram catheter to thread. Since she just had the ERCP and a stent, I did not feel like I needed to attempt this any further.  The cholangiogram was aborted. The cystic duct was clipped proximally three times and divided.  The gallbladder was then dissected from the liver bed with electrocautery. The specimen was placed in an Endopouch and was retrieved through the epigastric site.  Dr. Lovell Sheehan was present for the case and assisted with retraction and the cholangiogram attempts, holding the catheter and injecting saline.    Final inspection revealed acceptable hemostasis. Surgical SNO was placed in the gallbladder bed.  Trocars were removed and pneumoperitoneum was released.  0 Vicryl fascial sutures were used to close the epigastric and umbilical port sites. Skin incisions were closed with 4-0 Monocryl subcuticular sutures and Dermabond. The patient was awakened from anesthesia and extubated without complication.    Algis Greenhouse, MD [facility] [address] [place], [place] [address] [phone] (office)

## 2021-07-15 NOTE — Addendum Note (Signed)
Addendum  created 07/15/21 1058 by Julian Reil, CRNA   Intraprocedure Staff edited

## 2021-07-16 DIAGNOSIS — K851 Biliary acute pancreatitis without necrosis or infection: Secondary | ICD-10-CM | POA: Diagnosis not present

## 2021-07-16 DIAGNOSIS — R7989 Other specified abnormal findings of blood chemistry: Secondary | ICD-10-CM | POA: Diagnosis not present

## 2021-07-16 DIAGNOSIS — K8050 Calculus of bile duct without cholangitis or cholecystitis without obstruction: Secondary | ICD-10-CM | POA: Diagnosis not present

## 2021-07-16 DIAGNOSIS — E876 Hypokalemia: Secondary | ICD-10-CM

## 2021-07-16 DIAGNOSIS — K869 Disease of pancreas, unspecified: Secondary | ICD-10-CM

## 2021-07-16 DIAGNOSIS — I10 Essential (primary) hypertension: Secondary | ICD-10-CM | POA: Diagnosis not present

## 2021-07-16 LAB — COMPREHENSIVE METABOLIC PANEL
ALT: 104 U/L — ABNORMAL HIGH (ref 0–44)
AST: 70 U/L — ABNORMAL HIGH (ref 15–41)
Albumin: 2.8 g/dL — ABNORMAL LOW (ref 3.5–5.0)
Alkaline Phosphatase: 226 U/L — ABNORMAL HIGH (ref 38–126)
Anion gap: 7 (ref 5–15)
BUN: 12 mg/dL (ref 8–23)
CO2: 25 mmol/L (ref 22–32)
Calcium: 10.5 mg/dL — ABNORMAL HIGH (ref 8.9–10.3)
Chloride: 106 mmol/L (ref 98–111)
Creatinine, Ser: 0.71 mg/dL (ref 0.44–1.00)
GFR, Estimated: >60 mL/min (ref >60)
Glucose, Bld: 106 mg/dL — ABNORMAL HIGH (ref 70–99)
Potassium: 3.7 mmol/L (ref 3.5–5.1)
Sodium: 138 mmol/L (ref 135–145)
Total Bilirubin: 2.7 mg/dL — ABNORMAL HIGH (ref 0.3–1.2)
Total Protein: 6.7 g/dL (ref 6.5–8.1)

## 2021-07-16 MED ORDER — TRAMADOL HCL 50 MG PO TABS: 50.0000 mg | ORAL_TABLET | Freq: Four times a day (QID) | ORAL | 0 refills | Status: DC | PRN

## 2021-07-16 MED ORDER — ADULT MULTIVITAMIN W/MINERALS CH: 1.0000 | ORAL_TABLET | Freq: Every day | ORAL | 0 refills | Status: AC

## 2021-07-16 MED ORDER — ONDANSETRON HCL 4 MG PO TABS
4.0000 mg | ORAL_TABLET | Freq: Four times a day (QID) | ORAL | 0 refills | Status: DC | PRN
Start: 2021-07-16 — End: 2021-07-29

## 2021-07-16 MED ORDER — OXYCODONE HCL 5 MG PO TABS: 5.0000 mg | ORAL_TABLET | Freq: Four times a day (QID) | ORAL | 0 refills | Status: DC | PRN

## 2021-07-16 NOTE — Plan of Care (Signed)

## 2021-07-16 NOTE — Plan of Care (Signed)

## 2021-07-16 NOTE — Progress Notes (Signed)
[facility]  Looks great. Labs coming down. Tolerating diet. Wants to go home. Can send her FMLA to my office, we can do this for her admission and time she will need off post op. 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   She is not sure 2 weeks post op is going to be enough for her type of work. Will see her 6/1 weeks. Office can give her 4 weeks post op given her concern for work and can always release earlier if needed.  Roxicodone for home.  Future Appointments  Date Time Provider [place]  07/28/2021  1:45 PM Virl Cagey, MD [facility] None  09/06/2021  9:20 AM Fayrene Helper, MD [facility] [place], MD [facility] [address] [place], [place] [address] [phone] (office)

## 2021-07-16 NOTE — Discharge Summary (Signed)
Physician Discharge Summary  Laurie Olson QAS:[id number] DOB: February 15, 1961 DOA: 07/12/2021  PCP: Kerri Perches, MD  Admit date: 07/12/2021 Discharge date: 07/16/2021  Admitted From: Home  Discharge disposition: Home  Recommendations for Outpatient Follow-Up:   Follow up with your primary care provider in one week.  Check CBC, BMP, magnesium in the next visit Follow-up with general surgery on 07/28/2021 for postcholecystectomy follow-up. Follow-up with GI in 4 weeks for repeat ERCP.  Office to schedule an appointment.   Discharge Diagnosis:   Principal Problem:   Gallstone pancreatitis Active Problems:   Essential hypertension   Elevated liver function tests   Obstructive jaundice   Pancreatic lesion   Choledocholithiasis   Hypokalemia   Discharge Condition: Improved.  Diet recommendation:  Regular.  Wound care: None.  Code status: Full.  History of Present Illness:   Laurie Olson is a 61 y.o. female with past medical history significant for cholelithiasis, hypertension presented to the hospital with right upper quadrant abdominal pain for 1 month.  Patient was seeing GI and general surgery as outpatient.  Due to worsening abdominal pain, patient decided to come to the hospital.  She was scheduled to have MRCP as outpatient due to elevated LFTs but in the ED patient was noted to have bilirubin of 10.   Transaminases elevated near baseline at 189/212 for AST/ALT respectively.  Alkaline phosphatase elevated at 368.  WBC count was normal.  She was afebrile and vitals were otherwise stable.  UA showed 21-50 WBCs and many bacteria.  Patient was then considered for admission to the hospital for further evaluation and treatment  Hospital Course:   Following conditions were addressed during hospitalization as listed below,  Obstructive jaundice MRCP done on 07/12/2021 showed choledocholithiasis with 1 cm gallstone in the distal common bile duct resulting in biliary duct  obstruction with marked biliary dilatation/pancreatic duct dilatation.  Gallbladder was distended without signs of inflammation.  GI and general surgery was consulted.  Patient underwent ERCP 07/13/2021 with findings of large bulging ampulla with ulceration and dilated bile duct with a distal filling defect due to stone.  Biliary sphincterectomy was done with damia basketing and removal of stones and placement of biliary stent.  Status post  cholecystectomy 07/15/2021.  Has tolerated oral diet.  General surgery has seen the patient today and is stable for disposition home.  Will need to follow-up with general surgery as outpatient.  Patient will also follow-up with GI as outpatient for repeat ERCP in 4 weeks.  GI office to schedule an appointment.   Acute gallstone pancreatitis -Lipase of 2081 on presentation.  Status postcholecystectomy on 07/15/2021.  Improved at this time.  Patient will follow-up with general surgery as outpatient.   Hypokalemia. Replenished and improved.  Potassium prior to discharge was 3.7.   Hypertension Home medication consists of metoprolol and amlodipine.  Will resume on discharge.  Proteus UTI Urinalysis with 21-50 white cells.  Urine culture with Proteus.  Patient was on ciprofloxacin during hospitalization and has completed 3-day course of antibiotic.  She is afebrile without leukocytosis.   GERD Continue PPI.    Disposition.  At this time, patient is stable for disposition home with outpatient PCP, GI  and general surgery follow-up.  Communicated with the patient's family at bedside.  Medical Consultants:   Gastroenterology General surgery  Procedures:    ERCP with sphincterectomy and Damia basketing with balloon extraction on 07/13/2021 with placement of biliary stent Laparoscopic cholecystectomy on 07/15/2021   Subjective:  Today, patient was seen and examined at bedside.  Denies any nausea vomiting abdominal pain.  Seen by general surgery today and okay  for discharge.  Discharge Exam:   Vitals:   07/15/21 1900 07/16/21 0453  BP: 137/78 (!) 156/79  Pulse: 69 74  Resp: 18 19  Temp: 98.1 F (36.7 C) 98.1 F (36.7 C)  SpO2: 99% 98%   Vitals:   07/15/21 1008 07/15/21 1356 07/15/21 1900 07/16/21 0453  BP: 128/82 121/75 137/78 (!) 156/79  Pulse: 76 76 69 74  Resp: 18 18 18 19   Temp: 98 F (36.7 C) 98.1 F (36.7 C) 98.1 F (36.7 C) 98.1 F (36.7 C)  TempSrc:      SpO2: 100% 99% 99% 98%  Weight:      Height:       General: Alert awake, not in obvious distress HENT: pupils equally reacting to light, mild icterus noted.  Oral mucosa is moist.  Chest:  Clear breath sounds.  Diminished breath sounds bilaterally. No crackles or wheezes.  CVS: S1 &S2 heard. No murmur.  Regular rate and rhythm. Abdomen: Soft, nontender, nondistended.  Post laparoscopic cholecystectomy scar.  Bowel sounds are heard.   Extremities: No cyanosis, clubbing or edema.  Peripheral pulses are palpable. Psych: Alert, awake and oriented, normal mood CNS:  No cranial nerve deficits.  Power equal in all extremities.   Skin: Warm and dry.  No rashes noted.  The results of significant diagnostics from this hospitalization (including imaging, microbiology, ancillary and laboratory) are listed below for reference.     Diagnostic Studies:   DG ERCP  Result Date: Aug 06, 2021 CLINICAL DATA:  Choledocholithiasis, biliary sphincterotomy with stent placement EXAM: ERCP TECHNIQUE: Multiple spot images obtained with the fluoroscopic device and submitted for interpretation post-procedure. COMPARISON:  None Available. FINDINGS: Images document endoscopic cannulation And opacification of the CBD, passage of basket through the CBD, passage of balloon catheter through the CBD and subsequent placement of plastic biliary stent. There is incomplete opacification of the intrahepatic biliary tree, which appears mildly distended centrally. No extravasation identified. IMPRESSION:  Endoscopic CBD cannulation and intervention with stent placement. These images were submitted for radiologic interpretation only. Please see the procedural report for the amount of contrast and the fluoroscopy time utilized. Electronically Signed   By: 07/15/2021 M.D.   On: 08/06/2021 08:59     Labs:   Basic Metabolic Panel: Recent Labs  Lab 07/12/21 0246 2021-08-06 0604 07/14/21 0605 07/15/21 0523 07/16/21 0507  NA 136 135 137 139 138  K 3.6 3.4* 3.1* 3.1* 3.7  CL 107 103 107 107 106  CO2 24 25 23 25 25   GLUCOSE 110* 97 112* 97 106*  BUN 13 10 14 12 12   CREATININE 0.75 0.54 0.71 0.68 0.71  CALCIUM 10.7* 10.4* 10.2 10.3 10.5*  MG  --   --  1.9 1.9  --    GFR Estimated Creatinine Clearance: 78.7 mL/min (by C-G formula based on SCr of 0.71 mg/dL). Liver Function Tests: Recent Labs  Lab 07/12/21 0246 2021/08/06 0604 07/14/21 0605 07/15/21 0523 07/16/21 0507  AST 189* 130* 55* 49* 70*  ALT 212* 183* 117* 106* 104*  ALKPHOS 368* 327* 260* 264* 226*  BILITOT 10.0* 12.7* 3.8* 3.3* 2.7*  PROT 7.6 7.0 6.4* 7.1 6.7  ALBUMIN 3.5 3.1* 2.7* 3.0* 2.8*   Recent Labs  Lab 07/12/21 0246 08-06-21 0604 07/14/21 0605  LIPASE 1,931* 381* 44   No results for input(s): AMMONIA in the last 168 hours.  Coagulation profile No results for input(s): INR, PROTIME in the last 168 hours.  CBC: Recent Labs  Lab 07/12/21 0246 07/13/21 0604 07/14/21 0605 07/15/21 0523  WBC 6.6 9.7 11.0* 8.2  HGB 13.1 12.7 11.7* 12.7  HCT 39.1 38.5 36.0 38.0  MCV 92.4 92.3 91.8 91.3  PLT 269 240 216 271   Cardiac Enzymes: No results for input(s): CKTOTAL, CKMB, CKMBINDEX, TROPONINI in the last 168 hours. BNP: Invalid input(s): POCBNP CBG: No results for input(s): GLUCAP in the last 168 hours. D-Dimer No results for input(s): DDIMER in the last 72 hours. Hgb A1c No results for input(s): HGBA1C in the last 72 hours. Lipid Profile No results for input(s): CHOL, HDL, LDLCALC, TRIG, CHOLHDL, LDLDIRECT  in the last 72 hours. Thyroid function studies No results for input(s): TSH, T4TOTAL, T3FREE, THYROIDAB in the last 72 hours.  Invalid input(s): FREET3 Anemia work up No results for input(s): VITAMINB12, FOLATE, FERRITIN, TIBC, IRON, RETICCTPCT in the last 72 hours. Microbiology Recent Results (from the past 240 hour(s))  Urine Culture     Status: Abnormal   Collection Time: 07/12/21  7:00 AM   Specimen: Urine, Clean Catch  Result Value Ref Range Status   Specimen Description   Final    URINE, CLEAN CATCH Performed at [facility], [address] [place], [place] [address]    Special Requests   Final    NONE Performed at [facility], [address] [place], [place] [address]    Culture >=100,000 COLONIES/mL PROTEUS MIRABILIS (A)  Final   Report Status 07/14/2021 FINAL  Final   Organism ID, Bacteria PROTEUS MIRABILIS (A)  Final      Susceptibility   Proteus mirabilis - MIC*    AMPICILLIN >=32 RESISTANT Resistant     CEFAZOLIN 8 SENSITIVE Sensitive     CEFEPIME <=0.12 SENSITIVE Sensitive     CEFTRIAXONE <=0.25 SENSITIVE Sensitive     CIPROFLOXACIN <=0.25 SENSITIVE Sensitive     GENTAMICIN <=1 SENSITIVE Sensitive     IMIPENEM 2 SENSITIVE Sensitive     NITROFURANTOIN RESISTANT Resistant     TRIMETH/SULFA <=20 SENSITIVE Sensitive     AMPICILLIN/SULBACTAM 4 SENSITIVE Sensitive     PIP/TAZO <=4 SENSITIVE Sensitive     * >=100,000 COLONIES/mL PROTEUS MIRABILIS  Surgical pcr screen     Status: None   Collection Time: 07/13/21  3:02 AM   Specimen: Nasal Mucosa; Nasal Swab  Result Value Ref Range Status   MRSA, PCR NEGATIVE NEGATIVE Final   Staphylococcus aureus NEGATIVE NEGATIVE Final    Comment: (NOTE) The Xpert SA Assay (FDA approved for NASAL specimens in patients 62 years of age and older), is one component of a comprehensive surveillance program. It is not intended to diagnose infection nor to guide or monitor treatment. Performed at [facility], [address]  [place], [place] [address]      Discharge Instructions:   Discharge Instructions     Call MD for:  persistant nausea and vomiting   Complete by: As directed    Call MD for:  severe uncontrolled pain   Complete by: As directed    Call MD for:  temperature >100.4   Complete by: As directed    Diet general   Complete by: As directed    Discharge instructions   Complete by: As directed    Follow-up with your primary care physician in 1 week.  Check blood work at that time.  Follow-up with general surgery as has been scheduled on 07/28/21.  Seek medical attention for worsening symptoms.   Increase activity slowly   Complete by: As directed    No wound care   Complete by: As directed       Allergies as of 07/16/2021       Reactions   Hydrocodone-acetaminophen    REACTION: nausea, vomiting   Rocephin [ceftriaxone] Itching        Medication List     STOP taking these medications    betamethasone dipropionate 0.05 % cream   clotrimazole-betamethasone cream Commonly known as: LOTRISONE   polyethylene glycol-electrolytes 420 g solution Commonly known as: TriLyte       TAKE these medications    amLODipine 10 MG tablet Commonly known as: NORVASC Take 1 tablet by mouth once daily   fluticasone 50 MCG/ACT nasal spray Commonly known as: FLONASE Use 1 spray(s) in each nostril once daily   metoprolol succinate 25 MG 24 hr tablet Commonly known as: TOPROL-XL Take 1 tablet by mouth once daily   montelukast 10 MG tablet Commonly known as: SINGULAIR TAKE 1 TABLET BY MOUTH AT BEDTIME   multivitamin with minerals Tabs tablet Take 1 tablet by mouth daily.   ondansetron 4 MG tablet Commonly known as: ZOFRAN Take 1 tablet (4 mg total) by mouth every 6 (six) hours as needed for nausea.   pantoprazole 20 MG tablet Commonly known as: PROTONIX Take 1 tablet (20 mg total) by mouth daily.   potassium chloride SA 20 MEQ tablet Commonly known as: KLOR-CON M Take 1 tablet (20  mEq total) by mouth daily.   traMADol 50 MG tablet Commonly known as: Ultram Take 1 tablet (50 mg total) by mouth every 6 (six) hours as needed.   Vitamin D (Ergocalciferol) 1.25 MG (50000 UNIT) Caps capsule Commonly known as: DRISDOL Take 1 capsule by mouth once a week        Follow-up Information     Lucretia Roers, MD Follow up on 07/28/2021.   Specialty: General Surgery Why: post op check Contact information: [address] [facility] [address] [phone]         Kerri Perches, MD Follow up in 1 week(s).   Specialty: Family Medicine Contact information: [address] [place] [address] [phone]                  Time coordinating discharge: 39 minutes  Signed:  Audrea Bolte  [facility] 07/16/2021, 11:12 AM

## 2021-07-17 ENCOUNTER — Other Ambulatory Visit: Payer: Self-pay | Admitting: Family Medicine

## 2021-07-18 ENCOUNTER — Telehealth: Payer: Self-pay | Admitting: *Deleted

## 2021-07-18 ENCOUNTER — Encounter (HOSPITAL_COMMUNITY): Payer: Self-pay | Admitting: General Surgery

## 2021-07-18 LAB — SURGICAL PATHOLOGY

## 2021-07-18 NOTE — Telephone Encounter (Signed)
Transition Care Management Follow-up Telephone Call Date of discharge and from where: 07-16-21 [facility] How have you been since you were released from the hospital? Taking it easy  Any questions or concerns? No  Items Reviewed: Did the pt receive and understand the discharge instructions provided? Yes  Medications obtained and verified? Yes  Other? No  Any new allergies since your discharge? No  Dietary orders reviewed? Yes Do you have support at home? Yes   Home Care and Equipment/Supplies: Were home health services ordered? no If so, what is the name of the agency? NA  Has the agency set up a time to come to the patient's home? not applicable Were any new equipment or medical supplies ordered?  No What is the name of the medical supply agency? NA Were you able to get the supplies/equipment? not applicable Do you have any questions related to the use of the equipment or supplies? No  Functional Questionnaire: (I = Independent and D = Dependent) ADLs: i  Bathing/Dressing- i  Meal Prep- i  Eating- i  Maintaining continence- i  Transferring/Ambulation- i  Managing Meds- i  Follow up appointments reviewed:  PCP Hospital f/u appt confirmed? Yes  Scheduled to see 07-29-21 on [facility] @ 1:00. [facility] f/u appt confirmed? Yes  Scheduled to see [facility] on 07-28-21 . Are transportation arrangements needed? No  If their condition worsens, is the pt aware to call PCP or go to the Emergency Dept.? Yes Was the patient provided with contact information for the PCP's office or ED? Yes Was to pt encouraged to call back with questions or concerns? Yes

## 2021-07-18 NOTE — Telephone Encounter (Signed)
Received call from patient [phone]~ telephone.   Surgical Date: 07/13/2021 Procedure: ERCP~ Dr. Laural Golden  Surgical Date: 07/16/2030 Procedure: Lap Chole~ Dr. Constance Haw   Reports that she has contacted [employer] for FMLA. States that she would like leave to start from date of MRI ordered by Dr. Constance Haw on 07/11/2021.   Please advise.

## 2021-07-18 NOTE — Telephone Encounter (Signed)
Routed to [facility] Specialist.

## 2021-07-21 ENCOUNTER — Ambulatory Visit: Payer: BC Managed Care – PPO | Admitting: General Surgery

## 2021-07-26 NOTE — Telephone Encounter (Signed)
[employer] paperwork filled out and faxed to [phone]. Confirmation received.   Out of work starting 07/11/2021 with return to work date of 08/08/21.

## 2021-07-28 ENCOUNTER — Encounter: Payer: Self-pay | Admitting: General Surgery

## 2021-07-28 ENCOUNTER — Ambulatory Visit (INDEPENDENT_AMBULATORY_CARE_PROVIDER_SITE_OTHER): Payer: BC Managed Care – PPO | Admitting: General Surgery

## 2021-07-28 VITALS — BP 132/85 | HR 83 | Temp 98.1°F | Resp 14 | Ht 66.0 in | Wt 178.0 lb

## 2021-07-28 DIAGNOSIS — K8050 Calculus of bile duct without cholangitis or cholecystitis without obstruction: Secondary | ICD-10-CM

## 2021-07-28 NOTE — Patient Instructions (Signed)
Diet and activity as tolerated Get labs work with Dr. Moshe Cipro, specifically a comprehensive metabolic panel to look at your liver test.  Keep follow up with Dr. Laural Golden for stent removal

## 2021-07-28 NOTE — Progress Notes (Signed)
[facility] Note   HPI:  61 y.o. Female presents to clinic for post-op follow-up evaluation after a laparoscopic cholecystectomy and ERCP. Patient reports doing well and having no issues. She is eating and having Bms.   Review of Systems:  No pain No fevers All other review of systems: otherwise negative   Vital Signs:  BP 132/85   Pulse 83   Temp 98.1 F (36.7 C) (Other (Comment))   Resp 14   Ht 5\' 6"  (1.676 m)   Wt 178 lb (80.7 kg)   LMP 07/09/2010   BMI 28.73 kg/m    Physical Exam:  Physical Exam Cardiovascular:     Rate and Rhythm: Normal rate.  Pulmonary:     Effort: Pulmonary effort is normal.  Abdominal:     General: There is no distension.     Palpations: Abdomen is soft.     Tenderness: There is no abdominal tenderness.     Comments: Port sites c/d/I with dermabond peeling no erythema or drainage   Neurological:     Mental Status: She is alert.     Assessment:  61 y.o. yo Female with recent admission for gallstone pancreatitis, choledocholithiasis s/p ERCP and lap cholecystectomy. Doing well. Needs Repeat LFTs.  Plan:  Diet and activity as tolerated I talked to Dr. 77 and she will get labs tomorrow  Keep follow up with Dr. Lodema Hong for stent removal PRN follow up     Karilyn Cota, MD [facility] [address] [place], [place] [phone] (office)

## 2021-07-29 ENCOUNTER — Encounter: Payer: Self-pay | Admitting: Family Medicine

## 2021-07-29 ENCOUNTER — Ambulatory Visit: Payer: BC Managed Care – PPO | Admitting: Family Medicine

## 2021-07-29 VITALS — BP 130/84 | HR 74 | Resp 16 | Ht 66.0 in | Wt 180.1 lb

## 2021-07-29 DIAGNOSIS — E876 Hypokalemia: Secondary | ICD-10-CM | POA: Diagnosis not present

## 2021-07-29 DIAGNOSIS — K851 Biliary acute pancreatitis without necrosis or infection: Secondary | ICD-10-CM

## 2021-07-29 DIAGNOSIS — Z1231 Encounter for screening mammogram for malignant neoplasm of breast: Secondary | ICD-10-CM

## 2021-07-29 DIAGNOSIS — Z7689 Persons encountering health services in other specified circumstances: Secondary | ICD-10-CM | POA: Diagnosis not present

## 2021-07-29 DIAGNOSIS — R7401 Elevation of levels of liver transaminase levels: Secondary | ICD-10-CM

## 2021-07-29 DIAGNOSIS — M791 Myalgia, unspecified site: Secondary | ICD-10-CM

## 2021-07-29 DIAGNOSIS — I10 Essential (primary) hypertension: Secondary | ICD-10-CM

## 2021-07-29 NOTE — Patient Instructions (Signed)
F/U in 4 months, call if you need me sooner  Thankful you feel better  Please schedule mammogram at checkout  Labs today cBC, cmp and EGFr and Mg copy to GI   Continue to gradually increase food intake, healthy choices  Thanks for choosing [facility], we consider it a privelige to serve you.

## 2021-07-31 LAB — BMP8+EGFR
BUN/Creatinine Ratio: 15 (ref 12–28)
BUN: 14 mg/dL (ref 8–27)
CO2: 21 mmol/L (ref 20–29)
Calcium: 10.8 mg/dL — ABNORMAL HIGH (ref 8.7–10.3)
Chloride: 102 mmol/L (ref 96–106)
Creatinine, Ser: 0.92 mg/dL (ref 0.57–1.00)
Glucose: 106 mg/dL — ABNORMAL HIGH (ref 70–99)
Potassium: 3.8 mmol/L (ref 3.5–5.2)
Sodium: 140 mmol/L (ref 134–144)
eGFR: 71 mL/min/{1.73_m2} (ref >59)

## 2021-07-31 LAB — CBC
Hematocrit: 41.1 % (ref 34.0–46.6)
Hemoglobin: 13.7 g/dL (ref 11.1–15.9)
MCH: 30.2 pg (ref 26.6–33.0)
MCHC: 33.3 g/dL (ref 31.5–35.7)
MCV: 91 fL (ref 79–97)
Platelets: 308 10*3/uL (ref 150–450)
RBC: 4.53 x10E6/uL (ref 3.77–5.28)
RDW: 12.7 % (ref 11.7–15.4)
WBC: 6 10*3/uL (ref 3.4–10.8)

## 2021-07-31 LAB — MAGNESIUM: Magnesium: 2.1 mg/dL (ref 1.6–2.3)

## 2021-08-01 ENCOUNTER — Encounter: Payer: Self-pay | Admitting: Family Medicine

## 2021-08-01 DIAGNOSIS — Z7689 Persons encountering health services in other specified circumstances: Secondary | ICD-10-CM | POA: Insufficient documentation

## 2021-08-01 NOTE — Assessment & Plan Note (Signed)
rept chem 7 , normalized

## 2021-08-01 NOTE — Assessment & Plan Note (Signed)
Patient in for follow up of recent hospitalization. Discharge summary, and laboratory and radiology data are reviewed, and any questions or concerns  are discussed. Specific issues requiring follow up are specifically addressed.  

## 2021-08-01 NOTE — Progress Notes (Signed)
   Laurie Olson     MRN: [id number]      DOB: 20-Apr-1960   HPI Laurie Olson is here for follow up of recent hospitalization for gallstone pancreatitis, HTN, elevated LFT, pancreatic lesion, obstructive jaundice, choledocholithiasis an hypokalemia from 5/16 to 5/20 ROS Feels much better following hospitalization, denies pain, tolerating liquid diet and graduating up as tolerated to increased solids. No fever or chills. Op sites clean, no redness,  drainage or pain at op sites Denies recent fever or chills. Denies sinus pressure, nasal congestion, ear pain or sore throat. Denies chest congestion, productive cough or wheezing. Denies chest pains, palpitations and leg swelling   Denies dysuria, frequency, hesitancy or incontinence. Denies joint pain, swelling and limitation in mobility. Denies headaches, seizures, numbness, or tingling. Denies depression, anxiety or insomnia. Denies skin break down or rash.   PE  BP 130/84   Pulse 74   Resp 16   Ht 5\' 6"  (1.676 m)   Wt 180 lb 1.9 oz (81.7 kg)   LMP 07/09/2010   SpO2 98%   BMI 29.07 kg/m   Patient alert and oriented and in no cardiopulmonary distress.  HEENT: No facial asymmetry, EOMI,     Neck supple .  Chest: Clear to auscultation bilaterally.  CVS: S1, S2 no murmurs, no S3.Regular rate.  ABD: Soft non tender. Op sites clean and dry, healing well  Ext: No edema  MS: Adequate ROM spine, shoulders, hips and knees.  Skin: Intact, no ulcerations or rash noted.  Psych: Good eye contact, normal affect. Memory intact not anxious or depressed appearing.  CNS: CN 2-12 intact, power,  normal throughout.no focal deficits noted.   Assessment & Plan  Gallstone pancreatitis resolved following  Lap cholescystectomy and ERCP on 07/12/2021  Hypokalemia rept chem 7 , normalized  Encounter for support and coordination of transition of care Patient in for follow up of recent hospitalization. Discharge summary, and laboratory and  radiology data are reviewed, and any questions or concerns  are discussed. Specific issues requiring follow up are specifically addressed.

## 2021-08-01 NOTE — Assessment & Plan Note (Signed)
resolved following  Lap cholescystectomy and ERCP on 07/12/2021

## 2021-08-03 NOTE — Addendum Note (Signed)
Addended by: Fayrene Helper on: 08/03/2021 08:08 AM   Modules accepted: Level of Service

## 2021-08-04 ENCOUNTER — Encounter (INDEPENDENT_AMBULATORY_CARE_PROVIDER_SITE_OTHER): Payer: Self-pay | Admitting: Gastroenterology

## 2021-08-04 ENCOUNTER — Encounter (INDEPENDENT_AMBULATORY_CARE_PROVIDER_SITE_OTHER): Payer: Self-pay

## 2021-08-04 ENCOUNTER — Other Ambulatory Visit (INDEPENDENT_AMBULATORY_CARE_PROVIDER_SITE_OTHER): Payer: Self-pay

## 2021-08-04 ENCOUNTER — Ambulatory Visit (INDEPENDENT_AMBULATORY_CARE_PROVIDER_SITE_OTHER): Payer: BC Managed Care – PPO | Admitting: Gastroenterology

## 2021-08-04 VITALS — BP 125/60 | HR 66 | Temp 98.5°F | Ht 66.0 in | Wt 181.9 lb

## 2021-08-04 DIAGNOSIS — R748 Abnormal levels of other serum enzymes: Secondary | ICD-10-CM

## 2021-08-04 DIAGNOSIS — K869 Disease of pancreas, unspecified: Secondary | ICD-10-CM

## 2021-08-04 DIAGNOSIS — K851 Biliary acute pancreatitis without necrosis or infection: Secondary | ICD-10-CM | POA: Diagnosis not present

## 2021-08-04 LAB — SPECIMEN STATUS REPORT

## 2021-08-04 LAB — HEPATIC FUNCTION PANEL
ALT: 25 IU/L (ref 0–32)
AST: 20 IU/L (ref 0–40)
Albumin: 3.8 g/dL (ref 3.8–4.8)
Alkaline Phosphatase: 176 IU/L — ABNORMAL HIGH (ref 44–121)
Bilirubin Total: 1.1 mg/dL (ref 0.0–1.2)
Bilirubin, Direct: 0.77 mg/dL — ABNORMAL HIGH (ref 0.00–0.40)
Total Protein: 7 g/dL (ref 6.0–8.5)

## 2021-08-04 NOTE — Patient Instructions (Signed)
Laurie Olson  08/04/2021     @PREFPERIOPPHARMACY @   Your procedure is scheduled on  08/10/2021.   Report to [facility] at  1100  A.M.   Call this number if you have problems the morning of surgery:  [phone]   Remember:  Do not eat or drink after midnight.      Take these medicines the morning of surgery with A SIP OF WATER          amlodipine, metoprolol, protonix.     Do not wear jewelry, make-up or nail polish.  Do not wear lotions, powders, or perfumes, or deodorant.  Do not shave 48 hours prior to surgery.  Men may shave face and neck.  Do not bring valuables to the hospital.  [facility]aur[facility] is not responsible for any belongings or valuables.  Contacts, dentures or bridgework may not be worn into surgery.  Leave your suitcase in the car.  After surgery it may be brought to your room.  For patients admitted to the hospital, discharge time will be determined by your treatment team.  Patients discharged the day of surgery will not be allowed to drive home and must have someone with them for 24 hours.    Special instructions:   DO NOT smoke tobacco or vape for 24 hours before your procedure.  Please read over the following fact sheets that you were given. Anesthesia Post-op Instructions and Care and Recovery After Surgery      Endoscopic Retrograde Cholangiopancreatogram, Care After The following information offers guidance on how to care for yourself after your procedure. Your Olson care provider may also give you more specific instructions. If you have problems or questions, contact your Olson care provider. What can I expect after the procedure? After the procedure, it is common to have: Soreness in your throat. Nausea. Bloating. Follow these instructions at home: Medicines Take over-the-counter and prescription medicines only as told by your Olson care provider. If you were prescribed an antibiotic medicine, take it as told by your Olson care  provider. Do not stop using the antibiotic even if you start to feel better. General instructions  If you were given a sedative during the procedure, it can affect you for several hours. Do not drive or operate machinery until your Olson care provider says that it is safe. Have a responsible adult care for you for the time you are told. Return to your normal activities as told by your Olson care provider. Ask your Olson care provider what activities are safe for you. Return to eating what you normally do as soon as you feel well enough or as told by your Olson care provider. Keep all follow-up visits. This is important. Contact a Olson care provider if: You have pain in your abdomen that does not get better with medicine. You develop signs of infection, such as chills or a fever. Get help right away if: You have difficulty swallowing. You have worsening pain in your throat, chest, or abdomen. You vomit bright red blood or a substance that looks like coffee grounds. You have bloody or black, tarry stools. You have a sudden increase in swelling (bloating) in your abdomen. These symptoms may be an emergency. Get help right away. Call 911. Do not wait to see if the symptoms will go away. Do not drive yourself to the hospital. Summary After the procedure, it is common to have some soreness in your throat or some bloating of your  abdomen. If you were given a sedative during the procedure, it can affect you for several hours. Do not drive or operate machinery until your Olson care provider says that it is safe. Contact your Olson care provider if you have signs of infection, such as chills or a fever, or if you have pain that does not improve with medicine. Get help right away if you have trouble swallowing, worsening pain, bloody or black vomit, bloody or black stools, or increased swelling in your abdomen. This information is not intended to replace advice given to you by your Olson care  provider. Make sure you discuss any questions you have with your Olson care provider. Document Revised: 08/23/2020 Document Reviewed: 08/23/2020 Elsevier Patient Education  2023 Elsevier Inc. General Anesthesia, Adult, Care After This sheet gives you information about how to care for yourself after your procedure. Your Olson care provider may also give you more specific instructions. If you have problems or questions, contact your Olson care provider. What can I expect after the procedure? After the procedure, the following side effects are common: Pain or discomfort at the IV site. Nausea. Vomiting. Sore throat. Trouble concentrating. Feeling cold or chills. Feeling weak or tired. Sleepiness and fatigue. Soreness and body aches. These side effects can affect parts of the body that were not involved in surgery. Follow these instructions at home: For the time period you were told by your Olson care provider:  Rest. Do not participate in activities where you could fall or become injured. Do not drive or use machinery. Do not drink alcohol. Do not take sleeping pills or medicines that cause drowsiness. Do not make important decisions or sign legal documents. Do not take care of children on your own. Eating and drinking Follow any instructions from your Olson care provider about eating or drinking restrictions. When you feel hungry, start by eating small amounts of foods that are soft and easy to digest (bland), such as toast. Gradually return to your regular diet. Drink enough fluid to keep your urine pale yellow. If you vomit, rehydrate by drinking water, juice, or clear broth. General instructions If you have sleep apnea, surgery and certain medicines can increase your risk for breathing problems. Follow instructions from your Olson care provider about wearing your sleep device: Anytime you are sleeping, including during daytime naps. While taking prescription pain medicines,  sleeping medicines, or medicines that make you drowsy. Have a responsible adult stay with you for the time you are told. It is important to have someone help care for you until you are awake and alert. Return to your normal activities as told by your Olson care provider. Ask your Olson care provider what activities are safe for you. Take over-the-counter and prescription medicines only as told by your Olson care provider. If you smoke, do not smoke without supervision. Keep all follow-up visits as told by your Olson care provider. This is important. Contact a Olson care provider if: You have nausea or vomiting that does not get better with medicine. You cannot eat or drink without vomiting. You have pain that does not get better with medicine. You are unable to pass urine. You develop a skin rash. You have a fever. You have redness around your IV site that gets worse. Get help right away if: You have difficulty breathing. You have chest pain. You have blood in your urine or stool, or you vomit blood. Summary After the procedure, it is common to have a sore throat or nausea.  It is also common to feel tired. Have a responsible adult stay with you for the time you are told. It is important to have someone help care for you until you are awake and alert. When you feel hungry, start by eating small amounts of foods that are soft and easy to digest (bland), such as toast. Gradually return to your regular diet. Drink enough fluid to keep your urine pale yellow. Return to your normal activities as told by your Olson care provider. Ask your Olson care provider what activities are safe for you. This information is not intended to replace advice given to you by your Olson care provider. Make sure you discuss any questions you have with your Olson care provider. Document Revised: 10/30/2019 Document Reviewed: 05/29/2019 Elsevier Patient Education  2023 [employer].

## 2021-08-04 NOTE — Patient Instructions (Signed)
Schedule ERCP with Dr. Karilyn Cota Perform blood workup Repeat MRCP in 1 year

## 2021-08-04 NOTE — Progress Notes (Signed)
Maylon Peppers, M.D. Gastroenterology & Hepatology [facility] [address] [place], [place] [address]  Primary Care Physician: Fayrene Helper, MD [address] [place] [address]  I will communicate my assessment and recommendations to the referring MD via EMR.  Problems: Biliary pancreatitis status postcholecystectomy  History of Present Illness: Laurie Olson is a 61 y.o. female with past medical history of hypertension, constipation and recent episode of biliary pancreatitis, who presents for follow up of after recent hospitalization for biliary pancreatitis.  The patient was last seen during her most recent hospitalization at [facility].  The patient was admitted to the hospital on mid May 2023 after presenting acute onset abdominal pain and severe elevation of her LFTs with elevated lipase.  As outpatient she had presence of elevated LFTs with a dilated CBD which was being evaluated in our office and by general surgery.  When hospitalized, she was diagnosed with acute biliary pancreatitis for which she underwent an ERCP on 07/13/2021.  A biliary sphincterotomy was performed.  Plastic stent was placed in the common bile duct.  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  She had marked improvement of her LFTs after stent placement. Patient underwent cholecystectomy on 5/19 by Dr. Constance Haw.  She had a tortuous cystic duct but no signs of Mirizzi syndrome.  Patient was also found to have a pancreatic lesion in the pancreas.  Was recommended to have a repeat imaging in 1 year.  She was recommended to have a repeat ERCP in 4 weeks for follow-up of her biliary tree.  Most recent LFTs from 07/29/2021 showed an alkaline phosphatase of 176, total bilirubin of 1.1, AST of 20, ALT of 25, direct bilirubin 0.77  States that she has has had some soreness in her  RUQ at the area of her cholecystectomy but reports feeling well otherwise. Denies nausea, vomiting, fever, chills, hematochezia, melena, hematemesis, abdominal distention, abdominal pain, diarrhea, jaundice, pruritus. Has lost some weight since her surgery.  Last EGD: never Last Colonoscopy: never Patient was scheduled for colonoscopy on 09/08/2018 for screening purposes  Past Medical History: Past Medical History:  Diagnosis Date   Allergic rhinitis    Anemia    Constipation    Hypertension     Past Surgical History: Past Surgical History:  Procedure Laterality Date   CESAREAN SECTION     x2   CHOLECYSTECTOMY N/A 07/15/2021   Procedure: LAPAROSCOPIC CHOLECYSTECTOMY;  Surgeon: Virl Cagey, MD;  Location: [facility];  Service: General;  Laterality: N/A;   DUODENAL STENT PLACEMENT N/A 07/13/2021   Procedure: DUODENAL STENT PLACEMENT;  Surgeon: Rogene Houston, MD;  Location: [facility];  Service: Gastroenterology;  Laterality: N/A;   ERCP N/A 07/13/2021   Procedure: ENDOSCOPIC RETROGRADE CHOLANGIOPANCREATOGRAPHY (ERCP);  Surgeon: Rogene Houston, MD;  Location: [facility];  Service: Gastroenterology;  Laterality: N/A;   INTRAOPERATIVE CHOLANGIOGRAM  07/15/2021   Procedure: ATTEMPTED INTRAOPERATIVE CHOLANGIOGRAM;  Surgeon: Virl Cagey, MD;  Location: [facility];  Service: General;;   SPHINCTEROTOMY N/A 07/13/2021   Procedure: Joan Mayans;  Surgeon: Rogene Houston, MD;  Location: [facility];  Service: Gastroenterology;  Laterality: N/A;    Family History: Family History  Problem Relation Age of Onset   Hypertension Mother 84   Hypertension Sister 39   Allergies Son    Eczema Son    Asthma Son  Social History: Social History   Tobacco Use  Smoking Status Former   Packs/day: 2.00   Types: Cigarettes  Smokeless Tobacco Never   Social History   Substance and Sexual Activity  Alcohol Use No   Social History   Substance and Sexual Activity  Drug Use No     Allergies: Allergies  Allergen Reactions   Hydrocodone-Acetaminophen     REACTION: nausea, vomiting   Rocephin [Ceftriaxone] Itching    Medications: Current Outpatient Medications  Medication Sig Dispense Refill   amLODipine (NORVASC) 10 MG tablet Take 1 tablet by mouth once daily 30 tablet 0   fluticasone (FLONASE) 50 MCG/ACT nasal spray Use 1 spray(s) in each nostril once daily 16 g 5   metoprolol succinate (TOPROL-XL) 25 MG 24 hr tablet Take 1 tablet by mouth once daily 30 tablet 0   montelukast (SINGULAIR) 10 MG tablet TAKE 1 TABLET BY MOUTH AT BEDTIME 30 tablet 0   Multiple Vitamin (MULTIVITAMIN WITH MINERALS) TABS tablet Take 1 tablet by mouth daily. 100 tablet 0   pantoprazole (PROTONIX) 20 MG tablet Take 1 tablet (20 mg total) by mouth daily. 30 tablet 5   potassium chloride SA (KLOR-CON M) 20 MEQ tablet Take 1 tablet (20 mEq total) by mouth daily. 30 tablet 2   Vitamin D, Ergocalciferol, (DRISDOL) 1.25 MG (50000 UNIT) CAPS capsule Take 1 capsule by mouth once a week 12 capsule 0   No current facility-administered medications for this visit.    Review of Systems: GENERAL: negative for malaise, night sweats HEENT: No changes in hearing or vision, no nose bleeds or other nasal problems. NECK: Negative for lumps, goiter, pain and significant neck swelling RESPIRATORY: Negative for cough, wheezing CARDIOVASCULAR: Negative for chest pain, leg swelling, palpitations, orthopnea GI: SEE HPI MUSCULOSKELETAL: Negative for joint pain or swelling, back pain, and muscle pain. SKIN: Negative for lesions, rash PSYCH: Negative for sleep disturbance, mood disorder and recent psychosocial stressors. HEMATOLOGY Negative for prolonged bleeding, bruising easily, and swollen nodes. ENDOCRINE: Negative for cold or heat intolerance, polyuria, polydipsia and goiter. NEURO: negative for tremor, gait imbalance, syncope and seizures. The remainder of the review of systems is  noncontributory.   Physical Exam: BP 125/60 (BP Location: Left Arm, Patient Position: Sitting, Cuff Size: Small)   Pulse 66   Temp 98.5 F (36.9 C) (Oral)   Ht 5\' 6"  (1.676 m)   Wt 181 lb 14.4 oz (82.5 kg)   LMP 07/09/2010   BMI 29.36 kg/m  GENERAL: The patient is AO x3, in no acute distress. HEENT: Head is normocephalic and atraumatic. EOMI are intact. Mouth is well hydrated and without lesions. NECK: Supple. No masses LUNGS: Clear to auscultation. No presence of rhonchi/wheezing/rales. Adequate chest expansion HEART: RRR, normal s1 and s2. ABDOMEN: Mildly tender in the right upper quadrant around her surgical wound area, no guarding, no peritoneal signs, and nondistended. BS +. No masses. EXTREMITIES: Without any cyanosis, clubbing, rash, lesions or edema. NEUROLOGIC: AOx3, no focal motor deficit. SKIN: no jaundice, no rashes  Imaging/Labs: as above  I personally reviewed and interpreted the available labs, imaging and endoscopic files.  Impression and Plan: Laurie Olson is a 61 y.o. female with past medical history of hypertension, constipation and recent episode of biliary pancreatitis, who presents for follow up of after recent hospitalization for biliary pancreatitis.  The patient had significant improvement of her symptoms after undergoing endoscopic retrograde cholangiopancreatography and cholecystectomy during her most recent hospitalization.  She had major improvement of her  liver function tests with most recent aminotransferases in the normal range.  However, there is still some mild elevation of her alkaline phosphatase and total bilirubin.  We will evaluate this further with serology for PBC and checking alkaline phosphatase isoenzymes.  She will need to be scheduled for repeat ERCP with Dr. Laural Golden next week for removal of her plastic stent and possible cholangiogram.  Finally, she had a subtle pancreatic lesion which likely corresponds to a cyst.  We will repeat an MRCP  in 1 year for surveillance.  - Schedule ERCP with Dr. Laural Golden - check AMA, IgM and alkaline phosphatase isoenzymes - Repeat MRCP in 1 year  All questions were answered.      Harvel Quale, MD Gastroenterology and Hepatology [facility]

## 2021-08-05 ENCOUNTER — Ambulatory Visit (HOSPITAL_COMMUNITY): Payer: BC Managed Care – PPO

## 2021-08-08 ENCOUNTER — Encounter (HOSPITAL_COMMUNITY)
Admission: RE | Admit: 2021-08-08 | Discharge: 2021-08-08 | Disposition: A | Discharge status: Home / Self Care | Payer: BC Managed Care – PPO | Source: Ambulatory Visit | Attending: Internal Medicine | Admitting: Internal Medicine

## 2021-08-08 ENCOUNTER — Encounter (HOSPITAL_COMMUNITY): Payer: Self-pay

## 2021-08-08 DIAGNOSIS — K8050 Calculus of bile duct without cholangitis or cholecystitis without obstruction: Secondary | ICD-10-CM | POA: Diagnosis not present

## 2021-08-08 DIAGNOSIS — Z9049 Acquired absence of other specified parts of digestive tract: Secondary | ICD-10-CM | POA: Diagnosis not present

## 2021-08-08 DIAGNOSIS — Z01810 Encounter for preprocedural cardiovascular examination: Secondary | ICD-10-CM | POA: Insufficient documentation

## 2021-08-08 DIAGNOSIS — K219 Gastro-esophageal reflux disease without esophagitis: Secondary | ICD-10-CM | POA: Diagnosis not present

## 2021-08-08 DIAGNOSIS — Z87891 Personal history of nicotine dependence: Secondary | ICD-10-CM | POA: Diagnosis not present

## 2021-08-08 DIAGNOSIS — I10 Essential (primary) hypertension: Secondary | ICD-10-CM

## 2021-08-08 DIAGNOSIS — K838 Other specified diseases of biliary tract: Secondary | ICD-10-CM | POA: Diagnosis not present

## 2021-08-08 DIAGNOSIS — K8051 Calculus of bile duct without cholangitis or cholecystitis with obstruction: Secondary | ICD-10-CM | POA: Diagnosis present

## 2021-08-08 DIAGNOSIS — T85590A Other mechanical complication of bile duct prosthesis, initial encounter: Secondary | ICD-10-CM | POA: Diagnosis not present

## 2021-08-08 HISTORY — DX: Unspecified asthma, uncomplicated: J45.909

## 2021-08-09 ENCOUNTER — Telehealth (INDEPENDENT_AMBULATORY_CARE_PROVIDER_SITE_OTHER): Payer: BC Managed Care – PPO | Admitting: *Deleted

## 2021-08-09 ENCOUNTER — Other Ambulatory Visit: Payer: Self-pay | Admitting: Family Medicine

## 2021-08-09 DIAGNOSIS — K8050 Calculus of bile duct without cholangitis or cholecystitis without obstruction: Secondary | ICD-10-CM

## 2021-08-09 LAB — ALKALINE PHOSPHATASE ISOENZYMES
Alkaline phosphatase (APISO): 126 U/L (ref 37–153)
Bone Isoenzymes: 34 % (ref 28–66)
Intestinal Isoenzymes: 0 % — ABNORMAL LOW (ref 1–24)
Liver Isoenzymes: 66 % (ref 25–69)
Macrohepatic isoenzymes: 0 % (ref <=0)
Placental isoenzymes: 0 % (ref <=0)

## 2021-08-09 LAB — IGM: IgM, Serum: 104 mg/dL (ref 50–300)

## 2021-08-09 LAB — MITOCHONDRIAL ANTIBODIES: Mitochondrial M2 Ab, IgG: <=20 U (ref <=20.0)

## 2021-08-09 NOTE — Telephone Encounter (Signed)
[facility]  Patient should be able to return to work 6/11 as documented from the gallbladder surgery. She is having an ERCP and people go back to work the next day after EGD/ ERCP type procedures. She can also work with a stent in place.   If GI wants her out of work they can do a work note but do not think FMLA needs to be necessarily extended as she should be able to return to work before and after the ERCP.  Algis Greenhouse, MD [facility] [address] [place], [place] [address] [phone] (office)

## 2021-08-09 NOTE — Telephone Encounter (Signed)
Received call from patient [phone]~ telephone.    Surgical Date: 07/13/2021 Procedure: ERCP~ Dr. Karilyn Cota   Surgical Date: 07/16/2030 Procedure: Lap Chole~ Dr. Henreitta Leber  Forms completed for [facility] and faxed with out of work dates of 07/11/2021- 08/07/2021.   Patient states that she is scheduled for another ERCP with Dr. Levon Hedger on 08/10/2021 for stent removal. Reports that Dr. Henreitta Leber stated at last appointment that she could not give return to work date as patient continued under care of GI.   Patient reports that GI will not complete forms for [employer] to remain out of work due to upcoming procedures.   Please advise.

## 2021-08-10 ENCOUNTER — Ambulatory Visit (HOSPITAL_COMMUNITY): Payer: BC Managed Care – PPO

## 2021-08-10 ENCOUNTER — Ambulatory Visit (HOSPITAL_COMMUNITY): Payer: BC Managed Care – PPO | Admitting: Certified Registered"

## 2021-08-10 ENCOUNTER — Ambulatory Visit (HOSPITAL_COMMUNITY)
Admission: RE | Admit: 2021-08-10 | Discharge: 2021-08-10 | Disposition: A | Discharge status: Home / Self Care | Payer: BC Managed Care – PPO | Attending: Internal Medicine | Admitting: Internal Medicine

## 2021-08-10 ENCOUNTER — Encounter (HOSPITAL_COMMUNITY): Payer: Self-pay | Admitting: Internal Medicine

## 2021-08-10 ENCOUNTER — Encounter (HOSPITAL_COMMUNITY)
Admission: RE | Disposition: A | Discharge status: Home / Self Care | Payer: Self-pay | Source: Home / Self Care | Attending: Internal Medicine

## 2021-08-10 DIAGNOSIS — K8050 Calculus of bile duct without cholangitis or cholecystitis without obstruction: Secondary | ICD-10-CM | POA: Diagnosis not present

## 2021-08-10 DIAGNOSIS — K219 Gastro-esophageal reflux disease without esophagitis: Secondary | ICD-10-CM | POA: Insufficient documentation

## 2021-08-10 DIAGNOSIS — I10 Essential (primary) hypertension: Secondary | ICD-10-CM | POA: Insufficient documentation

## 2021-08-10 DIAGNOSIS — R748 Abnormal levels of other serum enzymes: Secondary | ICD-10-CM

## 2021-08-10 DIAGNOSIS — K838 Other specified diseases of biliary tract: Secondary | ICD-10-CM | POA: Diagnosis not present

## 2021-08-10 DIAGNOSIS — K8051 Calculus of bile duct without cholangitis or cholecystitis with obstruction: Secondary | ICD-10-CM | POA: Insufficient documentation

## 2021-08-10 DIAGNOSIS — Z9049 Acquired absence of other specified parts of digestive tract: Secondary | ICD-10-CM | POA: Insufficient documentation

## 2021-08-10 DIAGNOSIS — Z87891 Personal history of nicotine dependence: Secondary | ICD-10-CM | POA: Insufficient documentation

## 2021-08-10 DIAGNOSIS — T85590A Other mechanical complication of bile duct prosthesis, initial encounter: Secondary | ICD-10-CM | POA: Diagnosis not present

## 2021-08-10 DIAGNOSIS — Z4659 Encounter for fitting and adjustment of other gastrointestinal appliance and device: Secondary | ICD-10-CM | POA: Diagnosis not present

## 2021-08-10 HISTORY — PX: SPHINCTEROTOMY: SHX5544

## 2021-08-10 HISTORY — PX: ERCP: SHX5425

## 2021-08-10 LAB — HEPATIC FUNCTION PANEL
ALT: 22 U/L (ref 0–44)
AST: 26 U/L (ref 15–41)
Albumin: 4.2 g/dL (ref 3.5–5.0)
Alkaline Phosphatase: 126 U/L (ref 38–126)
Bilirubin, Direct: 0.6 mg/dL — ABNORMAL HIGH (ref 0.0–0.2)
Indirect Bilirubin: 1.1 mg/dL — ABNORMAL HIGH (ref 0.3–0.9)
Total Bilirubin: 1.7 mg/dL — ABNORMAL HIGH (ref 0.3–1.2)
Total Protein: 8.8 g/dL — ABNORMAL HIGH (ref 6.5–8.1)

## 2021-08-10 SURGERY — ERCP, WITH INTERVENTION IF INDICATED
Anesthesia: General

## 2021-08-10 MED ORDER — LIDOCAINE 2% (20 MG/ML) 5 ML SYRINGE
INTRAMUSCULAR | Status: DC | PRN
  Administered 2021-08-10: 100 mg via INTRAVENOUS

## 2021-08-10 MED ORDER — ONDANSETRON HCL 4 MG/2ML IJ SOLN
INTRAMUSCULAR | Status: DC | PRN
  Administered 2021-08-10: 4 mg via INTRAVENOUS

## 2021-08-10 MED ORDER — ROCURONIUM BROMIDE 10 MG/ML (PF) SYRINGE
PREFILLED_SYRINGE | INTRAVENOUS | Status: AC
  Filled 2021-08-10: qty 10

## 2021-08-10 MED ORDER — GLUCAGON HCL RDNA (DIAGNOSTIC) 1 MG IJ SOLR
INTRAMUSCULAR | Status: AC
  Filled 2021-08-10: qty 1

## 2021-08-10 MED ORDER — LACTATED RINGERS IV SOLN
INTRAVENOUS | Status: DC
  Administered 2021-08-10: 1000 mL via INTRAVENOUS

## 2021-08-10 MED ORDER — DEXAMETHASONE SODIUM PHOSPHATE 10 MG/ML IJ SOLN
INTRAMUSCULAR | Status: AC
  Filled 2021-08-10: qty 1

## 2021-08-10 MED ORDER — STERILE WATER FOR IRRIGATION IR SOLN
Status: DC | PRN
  Administered 2021-08-10: 1000 mL

## 2021-08-10 MED ORDER — DEXAMETHASONE SODIUM PHOSPHATE 10 MG/ML IJ SOLN
INTRAMUSCULAR | Status: DC | PRN
  Administered 2021-08-10: 5 mg via INTRAVENOUS

## 2021-08-10 MED ORDER — LIDOCAINE HCL (PF) 2 % IJ SOLN
INTRAMUSCULAR | Status: AC
  Filled 2021-08-10: qty 5

## 2021-08-10 MED ORDER — FENTANYL CITRATE (PF) 100 MCG/2ML IJ SOLN
INTRAMUSCULAR | Status: DC | PRN
  Administered 2021-08-10 (×2): 50 ug via INTRAVENOUS

## 2021-08-10 MED ORDER — CHLORHEXIDINE GLUCONATE 0.12 % MT SOLN
15.0000 mL | Freq: Once | OROMUCOSAL | Status: AC
  Administered 2021-08-10: 15 mL via OROMUCOSAL

## 2021-08-10 MED ORDER — MIDAZOLAM HCL 5 MG/5ML IJ SOLN
INTRAMUSCULAR | Status: DC | PRN
  Administered 2021-08-10: 2 mg via INTRAVENOUS

## 2021-08-10 MED ORDER — PROPOFOL 10 MG/ML IV BOLUS
INTRAVENOUS | Status: AC
  Filled 2021-08-10: qty 20

## 2021-08-10 MED ORDER — LEVOFLOXACIN IN D5W 500 MG/100ML IV SOLN
500.0000 mg | Freq: Once | INTRAVENOUS | Status: AC
  Administered 2021-08-10: 500 mg via INTRAVENOUS
  Filled 2021-08-10: qty 100

## 2021-08-10 MED ORDER — SODIUM CHLORIDE 0.9 % IV SOLN
INTRAVENOUS | Status: DC | PRN
  Administered 2021-08-10: 18 mL

## 2021-08-10 MED ORDER — PROPOFOL 10 MG/ML IV BOLUS
INTRAVENOUS | Status: DC | PRN
  Administered 2021-08-10: 150 mg via INTRAVENOUS

## 2021-08-10 MED ORDER — ROCURONIUM BROMIDE 100 MG/10ML IV SOLN
INTRAVENOUS | Status: DC | PRN
  Administered 2021-08-10: 50 mg via INTRAVENOUS

## 2021-08-10 MED ORDER — SUGAMMADEX SODIUM 200 MG/2ML IV SOLN
INTRAVENOUS | Status: DC | PRN
  Administered 2021-08-10: 200 mg via INTRAVENOUS

## 2021-08-10 MED ORDER — ORAL CARE MOUTH RINSE: 15.0000 mL | Freq: Once | OROMUCOSAL | Status: AC

## 2021-08-10 MED ORDER — MIDAZOLAM HCL 2 MG/2ML IJ SOLN
INTRAMUSCULAR | Status: AC
  Filled 2021-08-10: qty 2

## 2021-08-10 MED ORDER — SODIUM CHLORIDE 0.9 % IV SOLN
INTRAVENOUS | Status: AC
  Filled 2021-08-10: qty 50

## 2021-08-10 MED ORDER — FENTANYL CITRATE (PF) 100 MCG/2ML IJ SOLN
INTRAMUSCULAR | Status: AC
  Filled 2021-08-10: qty 2

## 2021-08-10 MED ORDER — ONDANSETRON HCL 4 MG/2ML IJ SOLN
INTRAMUSCULAR | Status: AC
  Filled 2021-08-10: qty 2

## 2021-08-10 SURGICAL SUPPLY — 27 items
BALLN RETRIEVAL 12X15 (BALLOONS) IMPLANT
BALN RTRVL 200 6-7FR 12-15 (BALLOONS)
BASKET TRAPEZOID 3X6 (MISCELLANEOUS) IMPLANT
BASKET TRAPEZOID LITHO 2.0X5 (MISCELLANEOUS) ×1 IMPLANT
BSKT STON RTRVL TRAPEZOID 2X5 (MISCELLANEOUS)
BSKT STON RTRVL TRAPEZOID 3X6 (MISCELLANEOUS)
DEVICE INFLATION ENCORE 26 (MISCELLANEOUS) IMPLANT
DEVICE LOCKING W-BIOPSY CAP (MISCELLANEOUS) ×1 IMPLANT
GLOVE BIOGEL PI IND STRL 7.0 (GLOVE) ×2 IMPLANT
GLOVE BIOGEL PI INDICATOR 7.0 (GLOVE)
GUIDEWIRE HYDRA JAGWIRE .35 (WIRE) IMPLANT
GUIDEWIRE JAG HINI 025X260CM (WIRE) IMPLANT
KIT ENDO PROCEDURE PEN (KITS) ×1 IMPLANT
KIT TURNOVER KIT A (KITS) IMPLANT
LUBRICANT JELLY 4.5OZ STERILE (MISCELLANEOUS) IMPLANT
PAD ARMBOARD 7.5X6 YLW CONV (MISCELLANEOUS) ×1 IMPLANT
POSITIONER HEAD 8X9X4 ADT (SOFTGOODS) IMPLANT
SCOPE SPY DS DISPOSABLE (MISCELLANEOUS) ×1 IMPLANT
SNARE ROTATE MED OVAL 20MM (MISCELLANEOUS) IMPLANT
SNARE SHORT THROW 13M SML OVAL (MISCELLANEOUS) IMPLANT
SPHINCTEROTOME AUTOTOME .25 (MISCELLANEOUS) IMPLANT
SPHINCTEROTOME HYDRATOME 44 (MISCELLANEOUS) IMPLANT
SYSTEM CONTINUOUS INJECTION (MISCELLANEOUS) ×1 IMPLANT
TUBING INSUFFLATOR CO2MPACT (TUBING) ×1 IMPLANT
WALLSTENT METAL COVERED 10X60 (STENTS) IMPLANT
WALLSTENT METAL COVERED 10X80 (STENTS) IMPLANT
WATER STERILE IRR 1000ML POUR (IV SOLUTION) ×1 IMPLANT

## 2021-08-10 NOTE — Discharge Instructions (Addendum)
No aspirin for 72 hours. Resume usual medications as before. Clear liquids today.  Usual diet starting tomorrow morning.  You can have half a meal or snack in the evening as long as you are not nauseated. No driving for 24 hours. Office visit in 1 year as planned.

## 2021-08-10 NOTE — Telephone Encounter (Signed)
Call placed to patient and patient made aware.  

## 2021-08-10 NOTE — Anesthesia Preprocedure Evaluation (Signed)
Anesthesia Evaluation  Patient identified by MRN, date of birth, ID band Patient awake    Reviewed: Allergy & Precautions, NPO status , Patient's Chart, lab work & pertinent test results, reviewed documented beta blocker date and time   Airway Mallampati: II  TM Distance: >3 FB Neck ROM: Full   Comment: Cervical pain, disc disease Dental  (+) Dental Advisory Given, Missing   Pulmonary asthma , former smoker,    Pulmonary exam normal breath sounds clear to auscultation       Cardiovascular Exercise Tolerance: Good hypertension, Pt. on medications and Pt. on home beta blockers  Rhythm:Regular Rate:Tachycardia     Neuro/Psych negative neurological ROS  negative psych ROS   GI/Hepatic GERD  Medicated and Controlled,Biliary stones, obstructive jaundice   Endo/Other  negative endocrine ROS  Renal/GU negative Renal ROS  negative genitourinary   Musculoskeletal negative musculoskeletal ROS (+)   Abdominal   Peds negative pediatric ROS (+)  Hematology  (+) Blood dyscrasia, anemia ,   Anesthesia Other Findings   Reproductive/Obstetrics negative OB ROS                             Anesthesia Physical  Anesthesia Plan  ASA: 2  Anesthesia Plan: General and General ETT   Post-op Pain Management:    Induction: Intravenous  PONV Risk Score and Plan: Ondansetron  Airway Management Planned: Oral ETT  Additional Equipment:   Intra-op Plan:   Post-operative Plan: Extubation in OR  Informed Consent: I have reviewed the patients History and Physical, chart, labs and discussed the procedure including the risks, benefits and alternatives for the proposed anesthesia with the patient or authorized representative who has indicated his/her understanding and acceptance.     Dental advisory given  Plan Discussed with: CRNA and Surgeon  Anesthesia Plan Comments: (Patient tachycardic, metoprolol  2.5 mg x2 PRN heart rate >90, midazolam 2mg  for anxiety will be given and reevaluate before the procedure, patient was on metoprolol at home and will discuss with Dr. .)        Anesthesia Quick Evaluation

## 2021-08-10 NOTE — Op Note (Signed)
[facility] Patient Name: Laurie Olson Procedure Date: 08/10/2021 12:45 PM MRN: [id number] Date of Birth: May 04, 1960 Attending MD: Hildred Laser , MD CSN: [id number] Age: 61 Admit Type: Outpatient Procedure:                ERCP Indications:              Common bile duct stone(s), Biliary stent removal Providers:                Hildred Laser, MD, Crystal Page, Randa Spike,                            Technician Referring MD:             and Tula Nakayama, MD Medicines:                General Anesthesia Complications:            No immediate complications. Estimated Blood Loss:     Estimated blood loss: none. Procedure:                Pre-Anesthesia Assessment:                           - Prior to the procedure, a History and Physical                            was performed, and patient medications and                            allergies were reviewed. The patient's tolerance of                            previous anesthesia was also reviewed. The risks                            and benefits of the procedure and the sedation                            options and risks were discussed with the patient.                            All questions were answered, and informed consent                            was obtained. Prior Anticoagulants: The patient has                            taken no previous anticoagulant or antiplatelet                            agents. ASA Grade Assessment: II - A patient with                            mild systemic disease. After reviewing the risks  and benefits, the patient was deemed in                            satisfactory condition to undergo the procedure.                           After obtaining informed consent, the scope was                            passed under direct vision. Throughout the                            procedure, the patient's blood pressure, pulse, and                            oxygen  saturations were monitored continuously. The                            [employer] single use                            duodenoscope was introduced through the mouth, and                            used to inject contrast into and used to inject                            contrast into the bile duct. The ERCP was                            accomplished without difficulty. The patient                            tolerated the procedure well. Scope In: 1:17:35 PM Scope Out: 1:31:55 PM Total Procedure Duration: 0 hours 14 minutes 20 seconds  Findings:      A biliary stent was visible on the scout film. The esophagus was       successfully intubated under direct vision. The scope was advanced to a       normal major papilla in the descending duodenum without detailed       examination of the pharynx, larynx and associated structures, and upper       GI tract. The upper GI tract was grossly normal. One plastic stent       originating in the biliary tree was emerging from the major papilla. The       stent was partially occluded. One stent was removed from the biliary       tree using a snare. The stent was found to be partially occluded via the       water column test. There was a gaping orifice at the major papilla. The       bile duct was deeply cannulated with the Autotome sphincterotome.       Contrast was injected. I personally interpreted the bile duct images.       There was brisk flow of contrast through the ducts. Image quality  was       excellent. Contrast extended to the entire biliary tree. The common bile       duct and common hepatic duct were moderately dilated and diffusely       dilated. A 5 mm biliary sphincterotomy was made with a braided Autotome       sphincterotome using ERBE electrocautery. There was no       post-sphincterotomy bleeding. To discover objects, the biliary tree was       swept with a 12 mm balloon starting at the bifurcation. Sludge  was swept       from the duct. Impression:               - One partially occluded stent from the biliary                            tree was seen in the major papilla. The stent was                            removed with polypectomy snare                           - The major papilla appeared with evidence of prior                            sphincterotomy.                           - The common bile duct and common hepatic duct were                            moderately dilated without filling defects.                           - A biliary sphincterotomy was extended by 5 mm.                           - The biliary tree was swept and sludge was found. Moderate Sedation:      Per Anesthesia Care Recommendation:           - Discharge patient to home (with escort).                           - Clear liquid diet today.                           - Resume previous diet for 1 day.                           - Continue present medications.                           -No aspirin for 72 hours. Procedure Code(s):        --- Professional ---                           [id number], Endoscopic retrograde  cholangiopancreatography (ERCP); with removal of                            foreign body(s) or stent(s) from biliary/pancreatic                            duct(s)                           43264, Endoscopic retrograde                            cholangiopancreatography (ERCP); with removal of                            calculi/debris from biliary/pancreatic duct(s)                           43262, Endoscopic retrograde                            cholangiopancreatography (ERCP); with                            sphincterotomy/papillotomy Diagnosis Code(s):        --- Professional ---                           [id number], Other mechanical complication of bile                            duct prosthesis, initial encounter                           K83.8, Other specified diseases of  biliary tract                           Z46.59, Encounter for fitting and adjustment of                            other gastrointestinal appliance and device                           K80.50, Calculus of bile duct without cholangitis                            or cholecystitis without obstruction CPT copyright 2019 American Medical Association. All rights reserved. The codes documented in this report are preliminary and upon coder review may  be revised to meet current compliance requirements. Hildred Laser, MD Hildred Laser, MD 08/10/2021 1:55:17 PM This report has been signed electronically. Number of Addenda: 0

## 2021-08-10 NOTE — Anesthesia Procedure Notes (Signed)
Procedure Name: Intubation Date/Time: 08/10/2021 1:04 PM  Performed by: Gwyndolyn Saxon, CRNAPre-anesthesia Checklist: Patient identified, Emergency Drugs available, Suction available and Patient being monitored Patient Re-evaluated:Patient Re-evaluated prior to induction Oxygen Delivery Method: Circle system utilized Preoxygenation: Pre-oxygenation with 100% oxygen Induction Type: IV induction Ventilation: Mask ventilation without difficulty Laryngoscope Size: Miller and 2 Grade View: Grade I Tube type: Oral Tube size: 7.0 mm Number of attempts: 1 Airway Equipment and Method: Stylet Placement Confirmation: ETT inserted through vocal cords under direct vision, positive ETCO2 and breath sounds checked- equal and bilateral Secured at: 22 cm Tube secured with: Tape Dental Injury: Teeth and Oropharynx as per pre-operative assessment

## 2021-08-10 NOTE — H&P (Signed)
Laurie Olson is an 61 y.o. female.   Chief Complaint: Patient is here for ERCP with removal of stent and bile duct stone. HPI: Patient is 61 year old African-American female who was hospitalized 1 month ago for jaundice.  She was diagnosed with choledocholithiasis and cholelithiasis.  She underwent ERCP which suggested Mirizzi syndrome.  Her papilla was large bulging with ulceration.  Sphincterotomy was performed.  Some debris was removed with there was no stone.  Biliary stent was left in place.  She underwent laparoscopic cholecystectomy.  Cholestasis has resolved.  She is doing fine.  She has not had any nausea vomiting abdominal pain pruritus fever or chills.  Her appetite is good. Her LFTs have returned to normal.  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  Past Medical History:  Diagnosis Date   Allergic rhinitis    Anemia    Asthma    Constipation    Hypertension     Past Surgical History:  Procedure Laterality Date   CESAREAN SECTION     x2   CHOLECYSTECTOMY N/A 07/15/2021   Procedure: LAPAROSCOPIC CHOLECYSTECTOMY;  Surgeon: Lucretia Roers, MD;  Location: [facility];  Service: General;  Laterality: N/A;   DUODENAL STENT PLACEMENT N/A 07/13/2021   Procedure: DUODENAL STENT PLACEMENT;  Surgeon: Malissa Hippo, MD;  Location: [facility];  Service: Gastroenterology;  Laterality: N/A;   ERCP N/A 07/13/2021   Procedure: ENDOSCOPIC RETROGRADE CHOLANGIOPANCREATOGRAPHY (ERCP);  Surgeon: Malissa Hippo, MD;  Location: [facility];  Service: Gastroenterology;  Laterality: N/A;   INTRAOPERATIVE CHOLANGIOGRAM  07/15/2021   Procedure: ATTEMPTED INTRAOPERATIVE CHOLANGIOGRAM;  Surgeon: Lucretia Roers, MD;  Location: [facility];  Service: General;;   SPHINCTEROTOMY N/A 07/13/2021   Procedure: Dennison Mascot;  Surgeon: Malissa Hippo, MD;  Location: [facility];  Service: Gastroenterology;  Laterality: N/A;    Family History  Problem  Relation Age of Onset   Hypertension Mother 79   Hypertension Sister 27   Allergies Son    Eczema Son    Asthma Son    Social History:  reports that she has quit smoking. Her smoking use included cigarettes. She smoked an average of 2 packs per day. She has never used smokeless tobacco. She reports that she does not drink alcohol and does not use drugs.  Allergies:  Allergies  Allergen Reactions   Hydrocodone-Acetaminophen     REACTION: nausea, vomiting   Rocephin [Ceftriaxone] Itching    Medications Prior to Admission  Medication Sig Dispense Refill   amLODipine (NORVASC) 10 MG tablet Take 1 tablet by mouth once daily 30 tablet 0   fluticasone (FLONASE) 50 MCG/ACT nasal spray Use 1 spray(s) in each nostril once daily 16 g 5   metoprolol succinate (TOPROL-XL) 25 MG 24 hr tablet Take 1 tablet by mouth once daily 30 tablet 0   montelukast (SINGULAIR) 10 MG tablet TAKE 1 TABLET BY MOUTH AT BEDTIME 30 tablet 0   Multiple Vitamin (MULTIVITAMIN WITH MINERALS) TABS tablet Take 1 tablet by mouth daily. 100 tablet 0   pantoprazole (PROTONIX) 20 MG tablet Take 1 tablet (20 mg total) by mouth daily. 30 tablet 5   potassium chloride SA (KLOR-CON M) 20 MEQ tablet Take 1 tablet (20 mEq total) by mouth daily. 30 tablet 2   Vitamin D, Ergocalciferol, (DRISDOL) 1.25 MG (50000 UNIT) CAPS capsule Take 1 capsule by mouth once a week 12 capsule 0    Results for orders placed  or performed during the hospital encounter of 08/10/21 (from the past 48 hour(s))  Hepatic function panel     Status: Abnormal   Collection Time: 08/10/21 11:06 AM  Result Value Ref Range   Total Protein 8.8 (H) 6.5 - 8.1 g/dL   Albumin 4.2 3.5 - 5.0 g/dL   AST 26 15 - 41 U/L   ALT 22 0 - 44 U/L   Alkaline Phosphatase 126 38 - 126 U/L   Total Bilirubin 1.7 (H) 0.3 - 1.2 mg/dL   Bilirubin, Direct 0.6 (H) 0.0 - 0.2 mg/dL   Indirect Bilirubin 1.1 (H) 0.3 - 0.9 mg/dL    Comment: Performed at [facility], [address],  [place], [place] [address]   No results found.  Review of Systems  Blood pressure (!) 144/95, pulse 96, temperature 98 F (36.7 C), temperature source Oral, resp. rate 16, height 5\' 6"  (1.676 m), weight 82.5 kg, last menstrual period 07/09/2010, SpO2 100 %. Physical Exam HENT:     Mouth/Throat:     Mouth: Mucous membranes are moist.     Pharynx: Oropharynx is clear.  Eyes:     General: No scleral icterus.    Conjunctiva/sclera: Conjunctivae normal.  Cardiovascular:     Rate and Rhythm: Normal rate and regular rhythm.     Heart sounds: Normal heart sounds. No murmur heard. Pulmonary:     Effort: Pulmonary effort is normal.     Breath sounds: Normal breath sounds.  Abdominal:     General: There is no distension.     Palpations: Abdomen is soft. There is no mass.     Tenderness: There is no abdominal tenderness.  Musculoskeletal:        General: No swelling.     Cervical back: Neck supple.  Lymphadenopathy:     Cervical: No cervical adenopathy.  Skin:    General: Skin is warm and dry.  Neurological:     Mental Status: She is alert.      Assessment/Plan  History of choledocholithiasis ?  Mirizzi syndrome. ERCP with stent and stone removal.  09/08/2010, MD 08/10/2021, 12:36 PM

## 2021-08-10 NOTE — Transfer of Care (Signed)
Immediate Anesthesia Transfer of Care Note  Patient: Laurie Olson  Procedure(s) Performed: ENDOSCOPIC RETROGRADE CHOLANGIOPANCREATOGRAPHY (ERCP) SPHINCTEROTOMY  Patient Location: PACU  Anesthesia Type:General  Level of Consciousness: drowsy and patient cooperative  Airway & Oxygen Therapy: Patient Spontanous Breathing  Post-op Assessment: Report given to RN and Post -op Vital signs reviewed and stable  Post vital signs: Reviewed and stable  Last Vitals:  Vitals Value Taken Time  BP 133/88 08/10/21 1348  Temp    Pulse 95 08/10/21 1351  Resp 16 08/10/21 1352  SpO2 99 % 08/10/21 1351  Vitals shown include unvalidated device data.  Last Pain:  Vitals:   08/10/21 1108  TempSrc: Oral  PainSc: 0-No pain      Patients Stated Pain Goal: 7 (08/10/21 1108)  Complications: No notable events documented.

## 2021-08-11 NOTE — Telephone Encounter (Signed)
Received call from Malvin Johns STD.  Requested clarification for return to work dates.   Advised per documentation from Dr. Henreitta Leber that she has been released to return to work on 08/08/2021. Advised that stent removal/ ERCP is day surgery/ procedure and she would be able to return to work the next day or per GI recommendations. Advised that if further time off is required, GI may extend leave.   From general surgery standpoint, patient is cleared to return to work with no restrictions as of 08/08/2021.

## 2021-08-12 ENCOUNTER — Telehealth (INDEPENDENT_AMBULATORY_CARE_PROVIDER_SITE_OTHER): Payer: Self-pay

## 2021-08-12 NOTE — Anesthesia Postprocedure Evaluation (Signed)
Anesthesia Post Note  Patient: Laurie Olson  Procedure(s) Performed: ENDOSCOPIC RETROGRADE CHOLANGIOPANCREATOGRAPHY (ERCP) SPHINCTEROTOMY  Patient location during evaluation: Phase II Anesthesia Type: General Level of consciousness: awake Pain management: pain level controlled Vital Signs Assessment: post-procedure vital signs reviewed and stable Respiratory status: spontaneous breathing and respiratory function stable Cardiovascular status: blood pressure returned to baseline and stable Postop Assessment: no headache and no apparent nausea or vomiting Anesthetic complications: no Comments: Late entry   No notable events documented.   Last Vitals:  Vitals:   08/10/21 1348 08/10/21 1400  BP: 133/88 122/88  Pulse: (!) 102 93  Resp: 14 15  Temp: 36.5 C   SpO2: 96% 99%    Last Pain:  Vitals:   08/10/21 1400  TempSrc:   PainSc: 0-No pain                 Windell Norfolk

## 2021-08-12 NOTE — Telephone Encounter (Signed)
Per Dr. Levon Hedger patient will need to have the FMLA forms filled out by Dr. Algis Greenhouse as she is the one whom preformed the patient surgery. I have informed the patient of this several times. Patient called back statins she was to have a stent removal on 08/10/2021 by Dr. Karilyn Cota, and wanted him to sign the FMLA forms. She also had Liehart from [facility] to call me from [phone] ext [phone] stating they wanted Dr.Rehman to sign the FMLA forms. I checked with Dr. Karilyn Cota on 08/11/2021 and he confrimed that he did remove stent on patient on 08/10/2021, but this did not qualify him to fill out FMLA forms this would in fact default back to Dr. Algis Greenhouse as Dr. Levon Hedger had first informed patient of. I called Liehart and made her aware we were not the responsible party of signing the FMLA forms, that this would be Dr. Algis Greenhouse. She says they had forms from Dr. Henreitta Leber releasing the patient on 08/08/2021 back to work. I advised that per our Physicians patient was good to go back to work any time as they had no restrictions from our end, that if the patient needed a note to return for the day surgery on 08/10/2021 stent removal we would give her one,but FMLA forms would not be filled out by our Doctors as this is not a procedure that would require someone to be out for long/ one day.  Lie hart states understanding and I have notified the patient regarding this. She also states understanding.

## 2021-08-15 ENCOUNTER — Encounter (INDEPENDENT_AMBULATORY_CARE_PROVIDER_SITE_OTHER): Payer: Self-pay | Admitting: Gastroenterology

## 2021-08-15 ENCOUNTER — Telehealth (INDEPENDENT_AMBULATORY_CARE_PROVIDER_SITE_OTHER): Payer: Self-pay | Admitting: Gastroenterology

## 2021-08-15 NOTE — Telephone Encounter (Signed)
Received FMLA forms from [facility] with requested dates of leave noted as 08/09/2021- 08/26/2021.  Forms denied and faxed back to [facility].

## 2021-08-15 NOTE — Telephone Encounter (Signed)
Will send work note, should be good to go back to work on 6/15 (ERCP performed on 6/14)

## 2021-08-15 NOTE — Telephone Encounter (Signed)
Letter written to return to work on 08/11/2021. Patient aware and will come by the office and pick up the letter to take to her employer.

## 2021-08-15 NOTE — Telephone Encounter (Signed)
Pt's employer called to say that they need a doctor's note stating patient can return to work. She was seen by Dr Levon Hedger on 08/04/2021. Employer number is [phone] or patient can be reached at [phone]

## 2021-08-16 ENCOUNTER — Encounter: Payer: Self-pay | Admitting: *Deleted

## 2021-08-16 ENCOUNTER — Encounter (HOSPITAL_COMMUNITY): Payer: Self-pay | Admitting: Internal Medicine

## 2021-08-22 ENCOUNTER — Ambulatory Visit (HOSPITAL_COMMUNITY): Payer: BC Managed Care – PPO

## 2021-08-24 ENCOUNTER — Other Ambulatory Visit (INDEPENDENT_AMBULATORY_CARE_PROVIDER_SITE_OTHER): Payer: Self-pay

## 2021-08-24 DIAGNOSIS — Z1211 Encounter for screening for malignant neoplasm of colon: Secondary | ICD-10-CM

## 2021-09-06 ENCOUNTER — Ambulatory Visit: Payer: BC Managed Care – PPO | Admitting: Family Medicine

## 2021-09-07 ENCOUNTER — Ambulatory Visit (HOSPITAL_COMMUNITY)
Admission: RE | Admit: 2021-09-07 | Payer: BC Managed Care – PPO | Source: Home / Self Care | Admitting: Gastroenterology

## 2021-09-07 ENCOUNTER — Encounter (HOSPITAL_COMMUNITY): Admission: RE | Payer: Self-pay | Source: Home / Self Care

## 2021-09-07 SURGERY — COLONOSCOPY WITH PROPOFOL
Anesthesia: Monitor Anesthesia Care

## 2021-09-20 ENCOUNTER — Other Ambulatory Visit: Payer: Self-pay | Admitting: Family Medicine

## 2021-09-28 ENCOUNTER — Other Ambulatory Visit: Payer: Self-pay | Admitting: Family Medicine

## 2021-10-05 ENCOUNTER — Encounter: Payer: BC Managed Care – PPO | Admitting: Family Medicine

## 2021-10-14 ENCOUNTER — Other Ambulatory Visit: Payer: Self-pay | Admitting: Family Medicine

## 2021-10-24 ENCOUNTER — Other Ambulatory Visit: Payer: Self-pay | Admitting: Family Medicine

## 2021-10-28 ENCOUNTER — Other Ambulatory Visit: Payer: Self-pay | Admitting: Family Medicine

## 2021-10-28 DIAGNOSIS — K219 Gastro-esophageal reflux disease without esophagitis: Secondary | ICD-10-CM

## 2021-11-10 ENCOUNTER — Other Ambulatory Visit: Payer: Self-pay | Admitting: Family Medicine

## 2021-11-29 ENCOUNTER — Ambulatory Visit: Payer: BC Managed Care – PPO | Admitting: Family Medicine

## 2021-12-04 ENCOUNTER — Other Ambulatory Visit: Payer: Self-pay | Admitting: Family Medicine

## 2021-12-04 DIAGNOSIS — K219 Gastro-esophageal reflux disease without esophagitis: Secondary | ICD-10-CM

## 2021-12-08 ENCOUNTER — Other Ambulatory Visit: Payer: Self-pay | Admitting: Family Medicine

## 2021-12-20 ENCOUNTER — Other Ambulatory Visit: Payer: Self-pay | Admitting: Family Medicine

## 2022-01-05 ENCOUNTER — Encounter: Payer: Self-pay | Admitting: Family Medicine

## 2022-01-05 ENCOUNTER — Ambulatory Visit (INDEPENDENT_AMBULATORY_CARE_PROVIDER_SITE_OTHER): Payer: BC Managed Care – PPO | Admitting: Family Medicine

## 2022-01-05 VITALS — BP 118/80 | HR 71 | Ht 66.0 in | Wt 188.0 lb

## 2022-01-05 DIAGNOSIS — H608X2 Other otitis externa, left ear: Secondary | ICD-10-CM

## 2022-01-05 DIAGNOSIS — J302 Other seasonal allergic rhinitis: Secondary | ICD-10-CM | POA: Diagnosis not present

## 2022-01-05 DIAGNOSIS — E785 Hyperlipidemia, unspecified: Secondary | ICD-10-CM

## 2022-01-05 DIAGNOSIS — I10 Essential (primary) hypertension: Secondary | ICD-10-CM

## 2022-01-05 DIAGNOSIS — M542 Cervicalgia: Secondary | ICD-10-CM | POA: Diagnosis not present

## 2022-01-05 DIAGNOSIS — K219 Gastro-esophageal reflux disease without esophagitis: Secondary | ICD-10-CM

## 2022-01-05 DIAGNOSIS — E669 Obesity, unspecified: Secondary | ICD-10-CM

## 2022-01-05 DIAGNOSIS — E559 Vitamin D deficiency, unspecified: Secondary | ICD-10-CM

## 2022-01-05 MED ORDER — AMLODIPINE BESYLATE 10 MG PO TABS
10.0000 mg | ORAL_TABLET | Freq: Every day | ORAL | 3 refills | Status: DC
Start: 2022-01-05 — End: 2022-06-27

## 2022-01-05 MED ORDER — MONTELUKAST SODIUM 10 MG PO TABS
10.0000 mg | ORAL_TABLET | Freq: Every day | ORAL | 5 refills | Status: DC
Start: 2022-01-05 — End: 2022-06-27

## 2022-01-05 MED ORDER — CYCLOBENZAPRINE HCL 10 MG PO TABS
ORAL_TABLET | ORAL | 0 refills | Status: DC
Start: 2022-01-05 — End: 2022-06-27

## 2022-01-05 MED ORDER — CYCLOBENZAPRINE HCL 10 MG PO TABS: ORAL_TABLET | ORAL | 0 refills | Status: DC

## 2022-01-05 MED ORDER — PREDNISONE 10 MG PO TABS: 10.0000 mg | ORAL_TABLET | Freq: Two times a day (BID) | ORAL | 0 refills | Status: DC

## 2022-01-05 MED ORDER — KETOROLAC TROMETHAMINE 60 MG/2ML IM SOLN
60.0000 mg | Freq: Once | INTRAMUSCULAR | Status: AC
  Administered 2022-01-05: 60 mg via INTRAMUSCULAR

## 2022-01-05 MED ORDER — METHYLPREDNISOLONE ACETATE 80 MG/ML IJ SUSP
80.0000 mg | Freq: Once | INTRAMUSCULAR | Status: AC
  Administered 2022-01-05: 80 mg via INTRAMUSCULAR

## 2022-01-05 MED ORDER — CIPROFLOXACIN-DEXAMETHASONE 0.3-0.1 % OT SUSP: 4.0000 [drp] | Freq: Two times a day (BID) | OTIC | 0 refills | Status: DC

## 2022-01-05 NOTE — Patient Instructions (Addendum)
Annual exam at 52 or after call if you need me sooner.  Please schedule mammogram at checkout 4 PM or after per patient availability.  Toradol 60 mg IM and Depo-Medrol 80 mg IM in office today for neck and shoulder pain.  5-day course of prednisone is also prescribed.  Muscle relaxant at bedtime is also prescribed.  For left ear pain from irritation of the outer ear and eardrop is prescribed and I recommend that you do not traumatize the ear as you have been doing.  Please commit to taking allergy pills daily and using your Flonase spray daily to help to control allergies.  The short course of prednisone as well as injection will also help.   Fasting CBC, lipid, cmp and eGFR, tSH and vit d 3 to 7 days before March appointment  Nurse pls obtain and document the flu and covid vaccines  Thanks for choosing [facility], we consider it a privelige to serve you.

## 2022-01-05 NOTE — Assessment & Plan Note (Addendum)
Uncontrolled.Toradol and depo medrol administered IM in the office , to be followed by a short course of oral prednisone and muscle relaxant  

## 2022-01-08 ENCOUNTER — Encounter: Payer: Self-pay | Admitting: Family Medicine

## 2022-01-08 DIAGNOSIS — H6092 Unspecified otitis externa, left ear: Secondary | ICD-10-CM | POA: Insufficient documentation

## 2022-01-08 NOTE — Progress Notes (Signed)
Laurie Olson     MRN: [id number]      DOB: 09-02-1960   HPI Laurie Olson is here for follow up and re-evaluation of chronic medical conditions, medication management and review of any available recent lab and radiology data.  Preventive health is updated, specifically  Cancer screening and Immunization.   Questions or concerns regarding consultations or procedures which the PT has had in the interim are  addressed. The PT denies any adverse reactions to current medications since the last visit.  Left ear , neck and facial pain x 3 days  Left ear discomfort , uses ear plugs and Q tips  ROS Denies recent fever or chills. Denies chest congestion, productive cough or wheezing. Denies chest pains, palpitations and leg swelling Denies abdominal pain, nausea, vomiting,diarrhea or constipation.   Denies dysuria, frequency, hesitancy or incontinence.  Denies headaches, seizures, numbness, or tingling. Denies depression, anxiety or insomnia. Denies skin break down or rash.   PE  BP 118/80 (BP Location: Left Arm, Patient Position: Sitting, Cuff Size: Large)   Pulse 71   Ht 5\' 6"  (1.676 m)   Wt 188 lb (85.3 kg)   LMP 07/09/2010   SpO2 98%   BMI 30.34 kg/m   Patient alert and oriented and in no cardiopulmonary distress.  HEENT: No facial asymmetry, EOMI,     Neck decreased ROM with left trapezius spasm .Left otitis externa  Chest: Clear to auscultation bilaterally.  CVS: S1, S2 no murmurs, no S3.Regular rate.  ABD: Soft non tender.   Ext: No edema  MS: Adequate ROM spine, shoulders, hips and knees.  Skin: Intact, no ulcerations or rash noted.  Psych: Good eye contact, normal affect. Memory intact not anxious or depressed appearing.  CNS: CN 2-12 intact, power,  normal throughout.no focal deficits noted.   Assessment & Plan  Neck pain on left side Uncontrolled.Toradol and depo medrol administered IM in the office , to be followed by a short course of oral prednisone  and  muscle relaxant   Seasonal allergies Uncontrolled , needs to commit to taking med daily  Vitamin D deficiency Continue weekly supplement  Obesity (BMI 30.0-34.9)  Patient re-educated about  the importance of commitment to a  minimum of 150 minutes of exercise per week as able.  The importance of healthy food choices with portion control discussed, as well as eating regularly and within a 12 hour window most days. The need to choose "clean , green" food 50 to 75% of the time is discussed, as well as to make water the primary drink and set a goal of 64 ounces water daily.       01/05/2022    3:14 PM 08/10/2021   11:08 AM 08/08/2021    8:54 AM  Weight /BMI  Weight 188 lb 181 lb 14.1 oz 181 lb 14.4 oz  Height 5\' 6"  (1.676 m) 5\' 6"  (1.676 m) 5\' 6"  (1.676 m)  BMI 30.34 kg/m2 29.36 kg/m2 29.36 kg/m2      Essential hypertension Controlled, no change in medication DASH diet and commitment to daily physical activity for a minimum of 30 minutes discussed and encouraged, as a part of hypertension management. The importance of attaining a healthy weight is also discussed.     01/05/2022    3:14 PM 08/10/2021    2:00 PM 08/10/2021    1:48 PM 08/10/2021   11:08 AM 08/08/2021    8:54 AM 08/04/2021    8:45 AM 07/29/2021    1:18  PM  BP/Weight  Systolic BP 118 122 133 144 112 125 130  Diastolic BP 80 88 88 95 80 60 84  Wt. (Lbs) 188   181.88 181.9 181.9   BMI 30.34 kg/m2   29.36 kg/m2 29.36 kg/m2 29.36 kg/m2        GERD (gastroesophageal reflux disease) Controlled, no change in medication   Left otitis externa Pt reports ear trauma, wears ear plugs and continually digs/ scratches her ear, ciprodex prescribed and advised to d/c trauma

## 2022-01-08 NOTE — Assessment & Plan Note (Signed)
Controlled, no change in medication DASH diet and commitment to daily physical activity for a minimum of 30 minutes discussed and encouraged, as a part of hypertension management. The importance of attaining a healthy weight is also discussed.     01/05/2022    3:14 PM 08/10/2021    2:00 PM 08/10/2021    1:48 PM 08/10/2021   11:08 AM 08/08/2021    8:54 AM 08/04/2021    8:45 AM 07/29/2021    1:18 PM  BP/Weight  Systolic BP 118 122 133 144 112 125 130  Diastolic BP 80 88 88 95 80 60 84  Wt. (Lbs) 188   181.88 181.9 181.9   BMI 30.34 kg/m2   29.36 kg/m2 29.36 kg/m2 29.36 kg/m2

## 2022-01-08 NOTE — Assessment & Plan Note (Signed)
Continue weekly supplement 

## 2022-01-08 NOTE — Assessment & Plan Note (Signed)
  Patient re-educated about  the importance of commitment to a  minimum of 150 minutes of exercise per week as able.  The importance of healthy food choices with portion control discussed, as well as eating regularly and within a 12 hour window most days. The need to choose "clean , green" food 50 to 75% of the time is discussed, as well as to make water the primary drink and set a goal of 64 ounces water daily.       01/05/2022    3:14 PM 08/10/2021   11:08 AM 08/08/2021    8:54 AM  Weight /BMI  Weight 188 lb 181 lb 14.1 oz 181 lb 14.4 oz  Height 5\' 6"  (1.676 m) 5\' 6"  (1.676 m) 5\' 6"  (1.676 m)  BMI 30.34 kg/m2 29.36 kg/m2 29.36 kg/m2

## 2022-01-08 NOTE — Assessment & Plan Note (Signed)
Uncontrolled , needs to commit to taking med daily

## 2022-01-08 NOTE — Assessment & Plan Note (Signed)
Controlled, no change in medication  

## 2022-01-08 NOTE — Assessment & Plan Note (Signed)
Pt reports ear trauma, wears ear plugs and continually digs/ scratches her ear, ciprodex prescribed and advised to d/c trauma

## 2022-01-16 ENCOUNTER — Ambulatory Visit (HOSPITAL_COMMUNITY): Payer: BC Managed Care – PPO

## 2022-01-25 ENCOUNTER — Other Ambulatory Visit: Payer: Self-pay | Admitting: Family Medicine

## 2022-02-02 ENCOUNTER — Other Ambulatory Visit: Payer: Self-pay | Admitting: Family Medicine

## 2022-02-02 DIAGNOSIS — K219 Gastro-esophageal reflux disease without esophagitis: Secondary | ICD-10-CM

## 2022-02-28 ENCOUNTER — Other Ambulatory Visit: Payer: Self-pay | Admitting: Family Medicine

## 2022-03-16 ENCOUNTER — Other Ambulatory Visit: Payer: Self-pay | Admitting: Family Medicine

## 2022-04-05 ENCOUNTER — Other Ambulatory Visit: Payer: Self-pay | Admitting: Family Medicine

## 2022-04-05 DIAGNOSIS — K219 Gastro-esophageal reflux disease without esophagitis: Secondary | ICD-10-CM

## 2022-04-23 ENCOUNTER — Other Ambulatory Visit: Payer: Self-pay | Admitting: Family Medicine

## 2022-04-26 ENCOUNTER — Other Ambulatory Visit: Payer: Self-pay | Admitting: Family Medicine

## 2022-04-27 ENCOUNTER — Encounter: Payer: Self-pay | Admitting: Radiology

## 2022-04-27 ENCOUNTER — Encounter: Payer: BC Managed Care – PPO | Admitting: Family Medicine

## 2022-05-13 ENCOUNTER — Other Ambulatory Visit: Payer: Self-pay | Admitting: Family Medicine

## 2022-05-13 DIAGNOSIS — K219 Gastro-esophageal reflux disease without esophagitis: Secondary | ICD-10-CM

## 2022-06-06 ENCOUNTER — Other Ambulatory Visit: Payer: Self-pay | Admitting: Family Medicine

## 2022-06-14 ENCOUNTER — Other Ambulatory Visit: Payer: Self-pay | Admitting: Family Medicine

## 2022-06-14 DIAGNOSIS — K219 Gastro-esophageal reflux disease without esophagitis: Secondary | ICD-10-CM

## 2022-06-27 ENCOUNTER — Ambulatory Visit (INDEPENDENT_AMBULATORY_CARE_PROVIDER_SITE_OTHER): Payer: BC Managed Care – PPO | Admitting: Family Medicine

## 2022-06-27 ENCOUNTER — Encounter: Payer: Self-pay | Admitting: Family Medicine

## 2022-06-27 VITALS — BP 120/77 | HR 66 | Resp 16 | Ht 66.0 in | Wt 196.0 lb

## 2022-06-27 DIAGNOSIS — J302 Other seasonal allergic rhinitis: Secondary | ICD-10-CM

## 2022-06-27 DIAGNOSIS — Z1231 Encounter for screening mammogram for malignant neoplasm of breast: Secondary | ICD-10-CM

## 2022-06-27 DIAGNOSIS — Z1211 Encounter for screening for malignant neoplasm of colon: Secondary | ICD-10-CM

## 2022-06-27 DIAGNOSIS — H9202 Otalgia, left ear: Secondary | ICD-10-CM | POA: Diagnosis not present

## 2022-06-27 DIAGNOSIS — Z9109 Other allergy status, other than to drugs and biological substances: Secondary | ICD-10-CM

## 2022-06-27 DIAGNOSIS — J320 Chronic maxillary sinusitis: Secondary | ICD-10-CM

## 2022-06-27 DIAGNOSIS — Z0001 Encounter for general adult medical examination with abnormal findings: Secondary | ICD-10-CM

## 2022-06-27 MED ORDER — MONTELUKAST SODIUM 10 MG PO TABS: 10.0000 mg | ORAL_TABLET | Freq: Every day | ORAL | 5 refills | Status: DC

## 2022-06-27 MED ORDER — LORATADINE 10 MG PO TABS: ORAL_TABLET | ORAL | 11 refills | Status: DC

## 2022-06-27 MED ORDER — AMLODIPINE BESYLATE 10 MG PO TABS: 10.0000 mg | ORAL_TABLET | Freq: Every day | ORAL | 11 refills | Status: DC

## 2022-06-27 MED ORDER — OLOPATADINE HCL 0.1 % OP SOLN: 1.0000 [drp] | Freq: Two times a day (BID) | OPHTHALMIC | 6 refills | Status: AC

## 2022-06-27 MED ORDER — AZITHROMYCIN 250 MG PO TABS: ORAL_TABLET | ORAL | 0 refills | Status: AC

## 2022-06-27 NOTE — Patient Instructions (Addendum)
F/u 2nd week in July or shortly after for pap annd regular followw up , call if you need me sooner  Please schedule mammogram as soon as possible appt after 4:30 past due  You are referred for colonoscopy and allergy testing  Commit to daily montelukast and twice daily loratadine for allergies and allergy eye drops  Fasting labs  ordered in November to be re ordered pls, needed within 1 week   Z pack is prescribed for chronic sinusitis, and you are referred to ENT for chronic sinusitis and left ear pain for past 4 months  Thanks for choosing [facility], we consider it a privelige to serve you.

## 2022-06-28 ENCOUNTER — Encounter: Payer: Self-pay | Admitting: Family Medicine

## 2022-06-28 ENCOUNTER — Encounter (INDEPENDENT_AMBULATORY_CARE_PROVIDER_SITE_OTHER): Payer: Self-pay | Admitting: *Deleted

## 2022-06-28 DIAGNOSIS — H9202 Otalgia, left ear: Secondary | ICD-10-CM | POA: Insufficient documentation

## 2022-06-28 DIAGNOSIS — J320 Chronic maxillary sinusitis: Secondary | ICD-10-CM | POA: Insufficient documentation

## 2022-06-28 DIAGNOSIS — Z0001 Encounter for general adult medical examination with abnormal findings: Secondary | ICD-10-CM | POA: Insufficient documentation

## 2022-06-28 NOTE — Progress Notes (Signed)
    Laurie Olson     MRN: [id number]      DOB: March 01, 1960  Chief Complaint  Patient presents with   Ear Pain    Left ear pain and sinus tenderness and pain. Can feel fluid in her ear and when she used a qtip she said it was brown liquid. Has been going on 3 weeks and she has been taking ibuprofen and cordicidin cold and flu. Not helping     HPI: Patient is in for annual physical exam. 3 week h/o left ear pain with drainage, and longstanding h/o recurrent left ear pain C/o uncontrolled allergies, chronic head congestion, wtery eyes, nasal stuffiness , year round  Labs need updating, cancer screening overdue as in colonoscopy and mammogram Immunization is reviewed , and  updated if needed.   PE: BP 120/77   Pulse 66   Resp 16   Ht 5\' 6"  (1.676 m)   Wt 196 lb (88.9 kg)   LMP 07/09/2010   SpO2 97%   BMI 31.64 kg/m   Pleasant  female, alert and oriented x 3, in no cardio-pulmonary distress. Afebrile. HEENT No facial trauma or asymetry. Maxillary Sinuses  tender. Left TM not visualized, debris in outer ear canal, potential cotton from Q tip in outer ear canal on left, Right TM clear with good light reflex, bilateral conjunctival injection  Excess nasal congestion  Extra occullar muscles intact..  Neck: supple, no adenopathy,JVD or thyromegaly. Chest: Clear to ascultation bilaterally.No crackles or wheezes. Non tender to palpation  Breast: Not examined  Cardiovascular system; Heart sounds normal,  S1 and  S2 ,no S3.  No murmur, or thrill. Peripheral pulses normal.  Abdomen: Soft, non tender   GU: Not examined  Musculoskeletal exam: Full ROM of spine, hips , shoulders and knees. No deformity ,swelling or crepitus noted. No muscle wasting or atrophy.   Neurologic: Cranial nerves 2 to 12 intact. Power, tone ,sensation and reflexes normal throughout. No disturbance in gait. No tremor.  Skin: Intact, no ulceration, erythema , scaling or rash noted. Pigmentation  normal throughout  Psych; Normal mood and affect. Judgement and concentration normal   Assessment & Plan:  Annual visit for general adult medical examination with abnormal findings Annual exam as documented. Counseling done  re healthy lifestyle involving commitment to 150 minutes exercise per week, heart healthy diet, and attaining healthy weight.The importance of adequate sleep also discussed. . Immunization and cancer screening needs are specifically addressed at this visit.   Seasonal allergies Uncontrolled , commit to daily singulair and twice daily loratadine and refer to Allergist, also pataday eye drops  Chronic sinusitis of both maxillary sinuses 4 monht history , Z pack prescribed and referred to ENT  Otalgia, left 4 month history with probable cotton in outer ear from Q ip, refer ENT asap

## 2022-06-28 NOTE — Assessment & Plan Note (Signed)
4 month history with probable cotton in outer ear from Q ip, refer ENT asap

## 2022-06-28 NOTE — Assessment & Plan Note (Signed)
Uncontrolled , commit to daily singulair and twice daily loratadine and refer to Allergist, also pataday eye drops

## 2022-06-28 NOTE — Assessment & Plan Note (Signed)
4 monht history , Z pack prescribed and referred to ENT

## 2022-06-28 NOTE — Assessment & Plan Note (Addendum)
Annual exam as documented. Counseling done  re healthy lifestyle involving commitment to 150 minutes exercise per week, heart healthy diet, and attaining healthy weight.The importance of adequate sleep also discussed.  Immunization and cancer screening needs are specifically addressed at this visit.  

## 2022-07-03 ENCOUNTER — Ambulatory Visit (HOSPITAL_COMMUNITY): Payer: BC Managed Care – PPO

## 2022-07-18 ENCOUNTER — Other Ambulatory Visit: Payer: Self-pay | Admitting: Family Medicine

## 2022-07-18 DIAGNOSIS — K219 Gastro-esophageal reflux disease without esophagitis: Secondary | ICD-10-CM

## 2022-07-24 ENCOUNTER — Other Ambulatory Visit: Payer: Self-pay | Admitting: Family Medicine

## 2022-07-30 ENCOUNTER — Other Ambulatory Visit: Payer: Self-pay | Admitting: Family Medicine

## 2022-08-07 ENCOUNTER — Ambulatory Visit (INDEPENDENT_AMBULATORY_CARE_PROVIDER_SITE_OTHER): Payer: BC Managed Care – PPO | Admitting: Gastroenterology

## 2022-08-08 ENCOUNTER — Encounter (INDEPENDENT_AMBULATORY_CARE_PROVIDER_SITE_OTHER): Payer: Self-pay | Admitting: Gastroenterology

## 2022-08-22 ENCOUNTER — Other Ambulatory Visit: Payer: Self-pay | Admitting: Family Medicine

## 2022-08-22 DIAGNOSIS — K219 Gastro-esophageal reflux disease without esophagitis: Secondary | ICD-10-CM

## 2022-08-29 ENCOUNTER — Other Ambulatory Visit: Payer: Self-pay | Admitting: Family Medicine

## 2022-09-08 ENCOUNTER — Ambulatory Visit: Payer: BC Managed Care – PPO | Admitting: Family Medicine

## 2022-09-18 ENCOUNTER — Other Ambulatory Visit: Payer: Self-pay | Admitting: Family Medicine

## 2022-09-18 DIAGNOSIS — K219 Gastro-esophageal reflux disease without esophagitis: Secondary | ICD-10-CM

## 2022-10-10 ENCOUNTER — Other Ambulatory Visit: Payer: Self-pay | Admitting: Family Medicine

## 2022-11-04 ENCOUNTER — Other Ambulatory Visit: Payer: Self-pay | Admitting: Family Medicine

## 2022-11-04 DIAGNOSIS — K219 Gastro-esophageal reflux disease without esophagitis: Secondary | ICD-10-CM

## 2022-11-24 ENCOUNTER — Other Ambulatory Visit: Payer: Self-pay | Admitting: Family Medicine

## 2022-11-30 ENCOUNTER — Other Ambulatory Visit: Payer: Self-pay | Admitting: Family Medicine

## 2022-12-19 ENCOUNTER — Other Ambulatory Visit: Payer: Self-pay | Admitting: Family Medicine

## 2022-12-19 DIAGNOSIS — K219 Gastro-esophageal reflux disease without esophagitis: Secondary | ICD-10-CM

## 2022-12-31 ENCOUNTER — Other Ambulatory Visit: Payer: Self-pay | Admitting: Family Medicine

## 2023-01-01 ENCOUNTER — Encounter (INDEPENDENT_AMBULATORY_CARE_PROVIDER_SITE_OTHER): Payer: Self-pay | Admitting: *Deleted

## 2023-02-07 ENCOUNTER — Other Ambulatory Visit: Payer: Self-pay | Admitting: Family Medicine

## 2023-02-07 DIAGNOSIS — K219 Gastro-esophageal reflux disease without esophagitis: Secondary | ICD-10-CM

## 2023-03-02 ENCOUNTER — Other Ambulatory Visit: Payer: Self-pay | Admitting: Family Medicine

## 2023-03-16 ENCOUNTER — Other Ambulatory Visit: Payer: Self-pay | Admitting: Family Medicine

## 2023-03-20 ENCOUNTER — Other Ambulatory Visit: Payer: Self-pay | Admitting: Family Medicine

## 2023-03-28 ENCOUNTER — Other Ambulatory Visit: Payer: Self-pay | Admitting: Family Medicine

## 2023-04-07 ENCOUNTER — Other Ambulatory Visit: Payer: Self-pay | Admitting: Family Medicine

## 2023-04-07 DIAGNOSIS — K219 Gastro-esophageal reflux disease without esophagitis: Secondary | ICD-10-CM

## 2023-05-03 ENCOUNTER — Other Ambulatory Visit: Payer: Self-pay | Admitting: Family Medicine

## 2023-05-07 ENCOUNTER — Other Ambulatory Visit: Payer: Self-pay | Admitting: Family Medicine

## 2023-05-09 ENCOUNTER — Other Ambulatory Visit: Payer: Self-pay | Admitting: Family Medicine

## 2023-05-19 ENCOUNTER — Other Ambulatory Visit: Payer: Self-pay | Admitting: Family Medicine

## 2023-05-19 DIAGNOSIS — K219 Gastro-esophageal reflux disease without esophagitis: Secondary | ICD-10-CM

## 2023-06-03 ENCOUNTER — Other Ambulatory Visit: Payer: Self-pay | Admitting: Family Medicine

## 2023-06-23 ENCOUNTER — Other Ambulatory Visit: Payer: Self-pay | Admitting: Family Medicine

## 2023-06-23 DIAGNOSIS — K219 Gastro-esophageal reflux disease without esophagitis: Secondary | ICD-10-CM

## 2023-07-08 ENCOUNTER — Other Ambulatory Visit: Payer: Self-pay | Admitting: Family Medicine

## 2023-07-14 ENCOUNTER — Other Ambulatory Visit: Payer: Self-pay | Admitting: Family Medicine

## 2023-07-15 ENCOUNTER — Other Ambulatory Visit: Payer: Self-pay | Admitting: Family Medicine

## 2023-07-16 ENCOUNTER — Other Ambulatory Visit: Payer: Self-pay | Admitting: Family Medicine

## 2023-07-26 ENCOUNTER — Other Ambulatory Visit: Payer: Self-pay

## 2023-07-26 MED ORDER — AMLODIPINE BESYLATE 10 MG PO TABS: 10.0000 mg | ORAL_TABLET | Freq: Every day | ORAL | 3 refills | Status: DC

## 2023-08-04 ENCOUNTER — Other Ambulatory Visit: Payer: Self-pay | Admitting: Family Medicine

## 2023-08-04 DIAGNOSIS — K219 Gastro-esophageal reflux disease without esophagitis: Secondary | ICD-10-CM

## 2023-08-06 ENCOUNTER — Other Ambulatory Visit: Payer: Self-pay | Admitting: Family Medicine

## 2023-08-17 ENCOUNTER — Other Ambulatory Visit: Payer: Self-pay | Admitting: Family Medicine

## 2023-08-24 ENCOUNTER — Telehealth: Payer: Self-pay

## 2023-08-24 ENCOUNTER — Encounter: Payer: Self-pay | Admitting: Family Medicine

## 2023-08-24 ENCOUNTER — Telehealth: Payer: Self-pay | Admitting: Family Medicine

## 2023-08-24 NOTE — Telephone Encounter (Signed)
 Copied from CRM [id number]. Topic: Clinical - Request for Lab/Test Order >> Aug 24, 2023  2:27 PM Delon HERO wrote: Reason for CRM: Patient is calling to request order to be placed for [facility] and colonoscopy.

## 2023-08-28 ENCOUNTER — Encounter (INDEPENDENT_AMBULATORY_CARE_PROVIDER_SITE_OTHER): Payer: Self-pay | Admitting: *Deleted

## 2023-08-28 ENCOUNTER — Other Ambulatory Visit: Payer: Self-pay

## 2023-08-28 DIAGNOSIS — Z1231 Encounter for screening mammogram for malignant neoplasm of breast: Secondary | ICD-10-CM

## 2023-08-28 DIAGNOSIS — Z1211 Encounter for screening for malignant neoplasm of colon: Secondary | ICD-10-CM

## 2023-08-28 NOTE — Telephone Encounter (Signed)
 Called patient left voicemail to reach our office.

## 2023-09-06 ENCOUNTER — Other Ambulatory Visit: Payer: Self-pay | Admitting: Family Medicine

## 2023-09-06 MED ORDER — LORATADINE 10 MG PO TABS: ORAL_TABLET | ORAL | 11 refills | Status: AC

## 2023-09-06 MED ORDER — MONTELUKAST SODIUM 10 MG PO TABS: 10.0000 mg | ORAL_TABLET | Freq: Every day | ORAL | 0 refills | Status: DC

## 2023-09-06 MED ORDER — METOPROLOL SUCCINATE ER 25 MG PO TB24: 25.0000 mg | ORAL_TABLET | Freq: Every day | ORAL | 0 refills | Status: DC

## 2023-09-06 MED ORDER — POTASSIUM CHLORIDE CRYS ER 20 MEQ PO TBCR: 20.0000 meq | EXTENDED_RELEASE_TABLET | Freq: Every day | ORAL | 2 refills | Status: DC

## 2023-09-06 NOTE — Telephone Encounter (Unsigned)
 Copied from CRM [id number]. Topic: Clinical - Medication Refill >> Sep 06, 2023  2:19 PM Nathanel C wrote: Medication: metoprolol  succinate (TOPROL -XL) 25 MG 24 hr tablet  itamin D, Ergocalciferol , (DRISDOL ) 1.25 MG (50000 UNIT) CAPS capsule potassium chloride  SA (KLOR-CON  M) 20 MEQ tablet ciprofloxacin -dexamethasone  (CIPRODEX ) OTIC suspension cyclobenzaprine  (FLEXERIL ) 10 MG tablet loratadine  (CLARITIN ) 10 MG tablet montelukast  (SINGULAIR ) 10 MG tablet   Has the patient contacted their pharmacy? Yes  This is the patient's preferred pharmacy:  [facility] [address], [place] - [address] [place] [address] Phone: [phone] Fax: [phone]  Is this the correct pharmacy for this prescription? Yes If no, delete pharmacy and type the correct one.   Has the prescription been filled recently? Yes  Is the patient out of the medication? Yes  Has the patient been seen for an appointment in the last year OR does the patient have an upcoming appointment? Yes  Can we respond through MyChart? Yes  Agent: Please be advised that Rx refills may take up to 3 business days. We ask that you follow-up with your pharmacy.

## 2023-09-10 ENCOUNTER — Other Ambulatory Visit: Payer: Self-pay | Admitting: Family Medicine

## 2023-09-10 DIAGNOSIS — K219 Gastro-esophageal reflux disease without esophagitis: Secondary | ICD-10-CM

## 2023-09-14 ENCOUNTER — Ambulatory Visit (HOSPITAL_COMMUNITY)
Admission: RE | Admit: 2023-09-14 | Discharge: 2023-09-14 | Disposition: A | Discharge status: Home / Self Care | Source: Ambulatory Visit | Attending: Family Medicine | Admitting: Family Medicine

## 2023-09-14 ENCOUNTER — Ambulatory Visit (HOSPITAL_COMMUNITY): Payer: Self-pay

## 2023-09-14 DIAGNOSIS — Z1231 Encounter for screening mammogram for malignant neoplasm of breast: Secondary | ICD-10-CM | POA: Insufficient documentation

## 2023-09-14 NOTE — Telephone Encounter (Signed)
 This encounter was created in error - please disregard.

## 2023-09-21 ENCOUNTER — Encounter (INDEPENDENT_AMBULATORY_CARE_PROVIDER_SITE_OTHER): Payer: Self-pay

## 2023-09-21 ENCOUNTER — Telehealth: Payer: Self-pay

## 2023-09-21 ENCOUNTER — Other Ambulatory Visit: Payer: Self-pay

## 2023-09-21 ENCOUNTER — Ambulatory Visit (INDEPENDENT_AMBULATORY_CARE_PROVIDER_SITE_OTHER): Payer: Self-pay | Admitting: Family Medicine

## 2023-09-21 ENCOUNTER — Encounter: Payer: Self-pay | Admitting: Family Medicine

## 2023-09-21 VITALS — BP 112/76 | HR 64 | Resp 18 | Ht 66.0 in | Wt 189.1 lb

## 2023-09-21 DIAGNOSIS — H9190 Unspecified hearing loss, unspecified ear: Secondary | ICD-10-CM | POA: Diagnosis not present

## 2023-09-21 DIAGNOSIS — F411 Generalized anxiety disorder: Secondary | ICD-10-CM

## 2023-09-21 DIAGNOSIS — K219 Gastro-esophageal reflux disease without esophagitis: Secondary | ICD-10-CM | POA: Diagnosis not present

## 2023-09-21 DIAGNOSIS — I10 Essential (primary) hypertension: Secondary | ICD-10-CM

## 2023-09-21 DIAGNOSIS — E785 Hyperlipidemia, unspecified: Secondary | ICD-10-CM

## 2023-09-21 DIAGNOSIS — Z1211 Encounter for screening for malignant neoplasm of colon: Secondary | ICD-10-CM

## 2023-09-21 DIAGNOSIS — E559 Vitamin D deficiency, unspecified: Secondary | ICD-10-CM

## 2023-09-21 DIAGNOSIS — F5105 Insomnia due to other mental disorder: Secondary | ICD-10-CM

## 2023-09-21 DIAGNOSIS — Z0001 Encounter for general adult medical examination with abnormal findings: Secondary | ICD-10-CM | POA: Diagnosis not present

## 2023-09-21 DIAGNOSIS — F409 Phobic anxiety disorder, unspecified: Secondary | ICD-10-CM

## 2023-09-21 MED ORDER — CYCLOBENZAPRINE HCL 10 MG PO TABS: ORAL_TABLET | ORAL | 0 refills | Status: AC

## 2023-09-21 MED ORDER — TEMAZEPAM 7.5 MG PO CAPS: 7.5000 mg | ORAL_CAPSULE | Freq: Every evening | ORAL | 2 refills | Status: AC | PRN

## 2023-09-21 MED ORDER — MONTELUKAST SODIUM 10 MG PO TABS: 10.0000 mg | ORAL_TABLET | Freq: Every day | ORAL | 3 refills | Status: DC

## 2023-09-21 MED ORDER — AMLODIPINE BESYLATE 10 MG PO TABS: 10.0000 mg | ORAL_TABLET | Freq: Every day | ORAL | 3 refills | Status: DC

## 2023-09-21 MED ORDER — BUSPIRONE HCL 5 MG PO TABS: 5.0000 mg | ORAL_TABLET | Freq: Three times a day (TID) | ORAL | 3 refills | Status: DC

## 2023-09-21 MED ORDER — CIPROFLOXACIN-DEXAMETHASONE 0.3-0.1 % OT SUSP: 4.0000 [drp] | Freq: Two times a day (BID) | OTIC | 0 refills | Status: DC

## 2023-09-21 MED ORDER — PANTOPRAZOLE SODIUM 20 MG PO TBEC: 20.0000 mg | DELAYED_RELEASE_TABLET | Freq: Every day | ORAL | 3 refills | Status: DC

## 2023-09-21 MED ORDER — CIPROFLOXACIN-DEXAMETHASONE 0.3-0.1 % OT SUSP: 4.0000 [drp] | Freq: Two times a day (BID) | OTIC | 0 refills | Status: AC

## 2023-09-21 MED ORDER — METOPROLOL SUCCINATE ER 25 MG PO TB24: 25.0000 mg | ORAL_TABLET | Freq: Every day | ORAL | 3 refills | Status: DC

## 2023-09-21 NOTE — Telephone Encounter (Signed)
 Refill sent.

## 2023-09-21 NOTE — Patient Instructions (Addendum)
 F/U mid to end November, call if you need me sooner  Labs already ordered, 12/2021 , to be drawn  today.Nurse pls print  You are referred for your screening colonoscopy which is overdue please do follow through on this.  You are referred to ear nose and throat for hearing evaluation.  New for anxiety is buspirone 5 mg tablet 1 every 8 hours for example take at 6 in the morning 2 in the afternoon and 10 at night.  New for sleep is Restoril 1 tablet at bedtime take it at around 930 as your bedtime is 10.  Ensure that it there is no light or sound on in your bedroom.  It is important that you exercise regularly at least 30 minutes 5 times a week. If you develop chest pain, have severe difficulty breathing, or feel very tired, stop exercising immediately and seek medical attention  .Thanks for choosing [facility], we consider it a privelige to serve you.

## 2023-09-21 NOTE — Telephone Encounter (Signed)
 Copied from CRM [id number]. Topic: Clinical - Prescription Issue >> Sep 21, 2023 11:03 AM Turkey B wrote: Reason for CRM: Katheryn from [facility] states the rx, ciprodex , 1 bottle isnt going to be enough. She is asking if we want her to to another refill request for this

## 2023-09-21 NOTE — Assessment & Plan Note (Signed)

## 2023-09-21 NOTE — Assessment & Plan Note (Signed)
 Ent eval

## 2023-09-22 ENCOUNTER — Ambulatory Visit: Payer: Self-pay | Admitting: Family Medicine

## 2023-09-22 ENCOUNTER — Encounter: Payer: Self-pay | Admitting: Family Medicine

## 2023-09-22 DIAGNOSIS — F409 Phobic anxiety disorder, unspecified: Secondary | ICD-10-CM | POA: Insufficient documentation

## 2023-09-22 DIAGNOSIS — F411 Generalized anxiety disorder: Secondary | ICD-10-CM | POA: Insufficient documentation

## 2023-09-22 LAB — CBC WITH DIFFERENTIAL/PLATELET
Basophils Absolute: 0 x10E3/uL (ref 0.0–0.2)
Basos: 1 %
EOS (ABSOLUTE): 0.2 x10E3/uL (ref 0.0–0.4)
Eos: 5 %
Hematocrit: 39.4 % (ref 34.0–46.6)
Hemoglobin: 12.7 g/dL (ref 11.1–15.9)
Immature Grans (Abs): 0 x10E3/uL (ref 0.0–0.1)
Immature Granulocytes: 0 %
Lymphocytes Absolute: 1.7 x10E3/uL (ref 0.7–3.1)
Lymphs: 45 %
MCH: 30 pg (ref 26.6–33.0)
MCHC: 32.2 g/dL (ref 31.5–35.7)
MCV: 93 fL (ref 79–97)
Monocytes Absolute: 0.3 x10E3/uL (ref 0.1–0.9)
Monocytes: 9 %
Neutrophils Absolute: 1.5 x10E3/uL (ref 1.4–7.0)
Neutrophils: 39 %
Platelets: 210 x10E3/uL (ref 150–450)
RBC: 4.24 x10E6/uL (ref 3.77–5.28)
RDW: 13.2 % (ref 11.7–15.4)
WBC: 3.7 x10E3/uL (ref 3.4–10.8)

## 2023-09-22 LAB — LIPID PANEL
Chol/HDL Ratio: 4.2 ratio (ref 0.0–4.4)
Cholesterol, Total: 245 mg/dL — ABNORMAL HIGH (ref 100–199)
HDL: 59 mg/dL (ref >39)
LDL Chol Calc (NIH): 170 mg/dL — ABNORMAL HIGH (ref 0–99)
Triglycerides: 91 mg/dL (ref 0–149)
VLDL Cholesterol Cal: 16 mg/dL (ref 5–40)

## 2023-09-22 LAB — CMP14+EGFR
ALT: 17 IU/L (ref 0–32)
AST: 18 IU/L (ref 0–40)
Albumin: 4.2 g/dL (ref 3.9–4.9)
Alkaline Phosphatase: 70 IU/L (ref 44–121)
BUN/Creatinine Ratio: 13 (ref 12–28)
BUN: 11 mg/dL (ref 8–27)
Bilirubin Total: 1 mg/dL (ref 0.0–1.2)
CO2: 21 mmol/L (ref 20–29)
Calcium: 10.4 mg/dL — ABNORMAL HIGH (ref 8.7–10.3)
Chloride: 104 mmol/L (ref 96–106)
Creatinine, Ser: 0.82 mg/dL (ref 0.57–1.00)
Globulin, Total: 2.7 g/dL (ref 1.5–4.5)
Glucose: 87 mg/dL (ref 70–99)
Potassium: 3.9 mmol/L (ref 3.5–5.2)
Sodium: 139 mmol/L (ref 134–144)
Total Protein: 6.9 g/dL (ref 6.0–8.5)
eGFR: 80 mL/min/1.73 (ref >59)

## 2023-09-22 LAB — TSH: TSH: 0.423 u[IU]/mL — ABNORMAL LOW (ref 0.450–4.500)

## 2023-09-22 LAB — VITAMIN D 25 HYDROXY (VIT D DEFICIENCY, FRACTURES): Vit D, 25-Hydroxy: 26.6 ng/mL — ABNORMAL LOW (ref 30.0–100.0)

## 2023-09-22 NOTE — Progress Notes (Signed)
    Laurie Olson     MRN: [id number]      DOB: [birth year]  Chief Complaint  Patient presents with   Annual Exam    Cpe     HPI: Patient is in for annual physical exam. Anxiety and poor sleep are also addressed at the visit. Labs  drawn on the day of the visit are reviewed and excellent except vit D is slightly low. Immunization is reviewed , and  updated if needed.   PE: BP 112/76   Pulse 64   Resp 18   Ht 5' 6 (1.676 m)   Wt 189 lb 1.3 oz (85.8 kg)   LMP 07/09/2010   SpO2 98%   BMI 30.52 kg/m   Pleasant  female, alert and oriented x 3, in no cardio-pulmonary distress. Afebrile. HEENT No facial trauma or asymetry. Sinuses non tender.  Extra occullar muscles intact.. External ears normal, . Neck: supple, no adenopathy,JVD or thyromegaly.No bruits.  Chest: Clear to ascultation bilaterally.No crackles or wheezes. Non tender to palpation  Cardiovascular system; Heart sounds normal,  S1 and  S2 ,no S3.  No murmur, or thrill. Apical beat not displaced Peripheral pulses normal.  Abdomen: Soft, non tender, no organomegaly or masses. No bruits. Bowel sounds normal. No guarding, tenderness or rebound.    Musculoskeletal exam: Full ROM of spine, hips , shoulders and knees. No deformity ,swelling or crepitus noted. No muscle wasting or atrophy.   Neurologic: Cranial nerves 2 to 12 intact. Power, tone ,sensation and reflexes normal throughout. No disturbance in gait. No tremor.  Skin: Intact, no ulceration, erythema , scaling or rash noted. Pigmentation normal throughout  Psych; Normal mood and affect. Judgement and concentration normal   Assessment & Plan:  Hearing loss Ent eval   Annual visit for general adult medical examination with abnormal findings Annual exam as documented. Counseling done  re healthy lifestyle involving commitment to 150 minutes exercise per week, heart healthy diet, and attaining healthy weight.The importance of adequate  sleep also discussed. Regular seat belt use and home safety, is also discussed. Changes in health habits are decided on by the patient with goals and time frames  set for achieving them. Immunization and cancer screening needs are specifically addressed at this visit.    GAD (generalized anxiety disorder) Start buspar  5 mg three times daily and behavior modification, re eval in 3 to 4 months  Insomnia due to anxiety and fear Sleep hygiene reviewed and written information offered also. Prescription sent for  medication needed.

## 2023-09-22 NOTE — Assessment & Plan Note (Signed)
Sleep hygiene reviewed and written information offered also. Prescription sent for  medication needed.  

## 2023-09-22 NOTE — Assessment & Plan Note (Addendum)
 Start buspar  5 mg three times daily and behavior modification, re eval in 3 to 4 months

## 2023-09-24 ENCOUNTER — Encounter (INDEPENDENT_AMBULATORY_CARE_PROVIDER_SITE_OTHER): Payer: Self-pay | Admitting: *Deleted

## 2023-10-02 ENCOUNTER — Encounter (INDEPENDENT_AMBULATORY_CARE_PROVIDER_SITE_OTHER): Payer: Self-pay

## 2023-10-19 ENCOUNTER — Encounter: Payer: Self-pay | Admitting: Radiology

## 2023-11-06 ENCOUNTER — Other Ambulatory Visit: Payer: Self-pay | Admitting: Family Medicine

## 2023-11-06 ENCOUNTER — Encounter (INDEPENDENT_AMBULATORY_CARE_PROVIDER_SITE_OTHER): Payer: Self-pay

## 2023-12-12 ENCOUNTER — Encounter (INDEPENDENT_AMBULATORY_CARE_PROVIDER_SITE_OTHER): Payer: Self-pay | Admitting: Gastroenterology

## 2023-12-31 ENCOUNTER — Encounter: Payer: Self-pay | Admitting: Radiology

## 2024-01-18 ENCOUNTER — Ambulatory Visit: Admitting: Family Medicine

## 2024-01-19 ENCOUNTER — Other Ambulatory Visit: Payer: Self-pay | Admitting: Family Medicine

## 2024-01-31 ENCOUNTER — Other Ambulatory Visit: Payer: Self-pay | Admitting: Family Medicine

## 2024-03-04 ENCOUNTER — Encounter (INDEPENDENT_AMBULATORY_CARE_PROVIDER_SITE_OTHER): Payer: Self-pay | Admitting: *Deleted

## 2024-03-14 ENCOUNTER — Other Ambulatory Visit: Payer: Self-pay | Admitting: Family Medicine

## 2024-03-18 ENCOUNTER — Other Ambulatory Visit: Payer: Self-pay | Admitting: Family Medicine

## 2024-03-21 ENCOUNTER — Other Ambulatory Visit: Payer: Self-pay

## 2024-03-21 DIAGNOSIS — K219 Gastro-esophageal reflux disease without esophagitis: Secondary | ICD-10-CM

## 2024-03-21 MED ORDER — AMLODIPINE BESYLATE 10 MG PO TABS: 10.0000 mg | ORAL_TABLET | Freq: Every day | ORAL | 3 refills | Status: AC

## 2024-03-21 MED ORDER — METOPROLOL SUCCINATE ER 25 MG PO TB24: 25.0000 mg | ORAL_TABLET | Freq: Every day | ORAL | 3 refills | Status: AC

## 2024-03-21 MED ORDER — PANTOPRAZOLE SODIUM 20 MG PO TBEC: 20.0000 mg | DELAYED_RELEASE_TABLET | Freq: Every day | ORAL | 3 refills | Status: AC

## 2024-03-21 MED ORDER — BUSPIRONE HCL 5 MG PO TABS: 5.0000 mg | ORAL_TABLET | Freq: Three times a day (TID) | ORAL | 3 refills | Status: AC

## 2024-03-21 MED ORDER — MONTELUKAST SODIUM 10 MG PO TABS: 10.0000 mg | ORAL_TABLET | Freq: Every day | ORAL | 3 refills | Status: AC

## 2024-04-01 ENCOUNTER — Other Ambulatory Visit: Payer: Self-pay | Admitting: Family Medicine

## 2024-04-04 ENCOUNTER — Other Ambulatory Visit: Payer: Self-pay | Admitting: Internal Medicine

## 2024-04-04 ENCOUNTER — Telehealth: Payer: Self-pay

## 2024-04-04 DIAGNOSIS — E559 Vitamin D deficiency, unspecified: Secondary | ICD-10-CM

## 2024-04-04 DIAGNOSIS — E876 Hypokalemia: Secondary | ICD-10-CM

## 2024-04-04 MED ORDER — VITAMIN D (ERGOCALCIFEROL) 1.25 MG (50000 UNIT) PO CAPS: 50000.0000 [IU] | ORAL_CAPSULE | ORAL | 1 refills | Status: AC

## 2024-04-04 MED ORDER — POTASSIUM CHLORIDE CRYS ER 20 MEQ PO TBCR: 20.0000 meq | EXTENDED_RELEASE_TABLET | Freq: Every day | ORAL | 5 refills | Status: AC

## 2024-04-04 NOTE — Telephone Encounter (Signed)
 Copied from CRM #[id number]. Topic: Clinical - Medication Question >> Apr 03, 2024  4:11 PM Donna BRAVO wrote: Reason for CRM: These medications are not on patient list. Informed Hasnija of this. Hasnija stated patient reached out to the pharmacy asking them to call provider for new prescriptions.   ----------------------------------------------------------------------- From previous Reason for Contact - Medication Refill: Medication:   All theses medications have been Discontinued Patient called  [facility] asking them to call provider for refills.  ciprofloxacin -dexamethasone  0.3-0.1% not on list   Vitamin D  50000 units 1.25MG  not on list potassium chloride  SA (KLOR-CON  M) 20 MEQ not on list    Has the patient contacted their pharmacy?   (Agent: If no, request that the patient contact the pharmacy for the refill. If patient does not wish to contact the pharmacy document the reason why and proceed with request.) (Agent: If yes, when and what did the pharmacy advise?)  This is the patient's preferred pharmacy:  [facility] [place], [place] - [address] [place] [address] Phone: [phone] Fax: [phone]  [facility] [address], [place] - [address] [place] [address] Phone: [phone] Fax: [phone]  Is this the correct pharmacy for this prescription?   If no, delete pharmacy and type the correct one.   Has the prescription been filled recently?    Is the patient out of the medication?    Has the patient been seen for an appointment in the last year OR does the patient have an upcoming appointment?    Can we respond through MyChart?    Agent: Please be advised that Rx refills may take up to 3 business days. We ask that you follow-up with your pharmacy.

## 2024-04-04 NOTE — Telephone Encounter (Signed)
 Lvm to cb.

## 2024-04-06 IMAGING — XA DG ERCP WO/W SPHINCTEROTOMY
1 series · 15 of 24 positions shown · non-contrast
Comparison: ERCP images 07/13/2021

CLINICAL DATA: History of choledocholithiasis.

EXAM:
ERCP
TECHNIQUE: Multiple spot images obtained with the fluoroscopic device and
submitted for interpretation post-procedure.
FLUOROSCOPY:
Radiation Exposure Index (as provided by the fluoroscopic device):
32.02 mGy Kerma

[Series 1: dg no report - auto finalize · 0.20mm/px · 7 acquisitions, 15 frames shown]
[im 1/7]
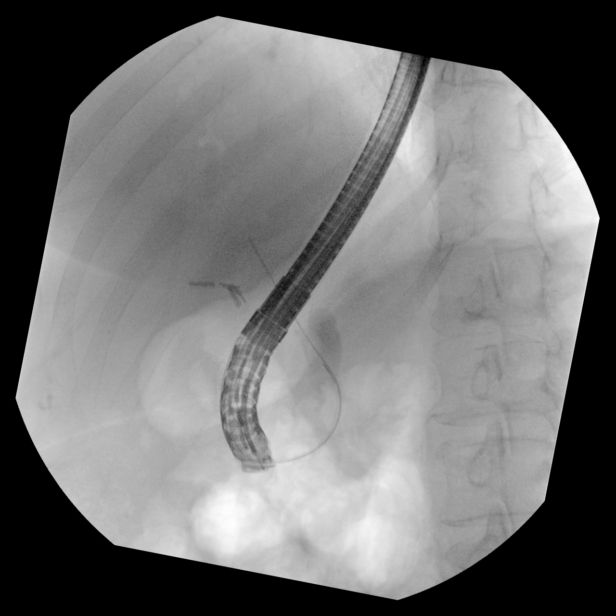
[im 1/7]
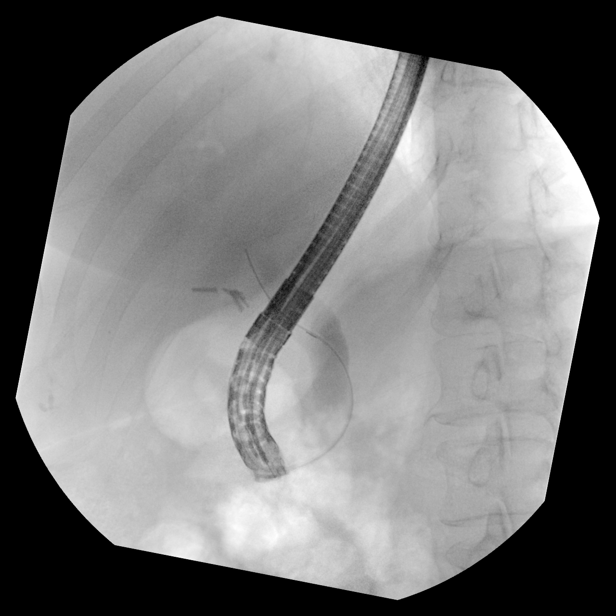
[im 2/7]
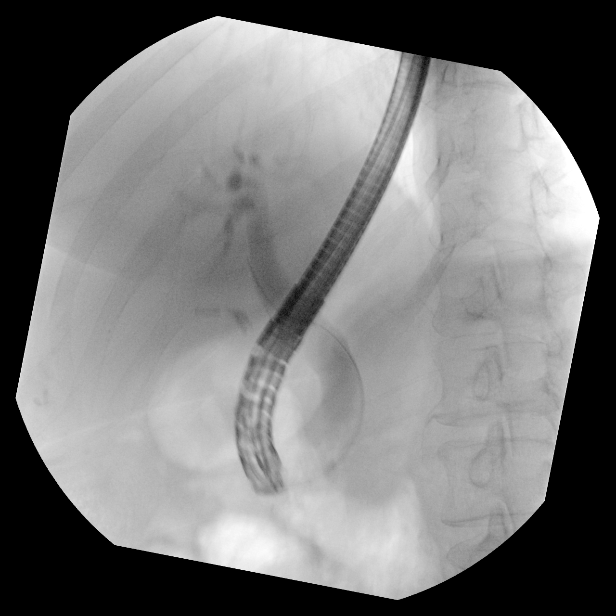
[im 2/7]
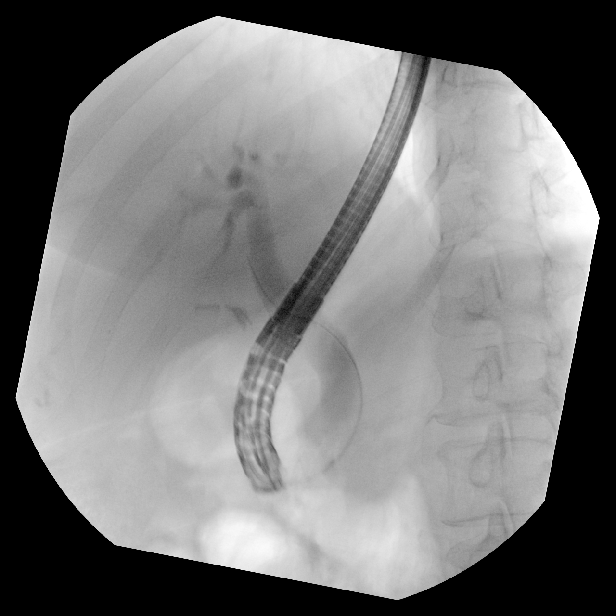
[im 3/7]
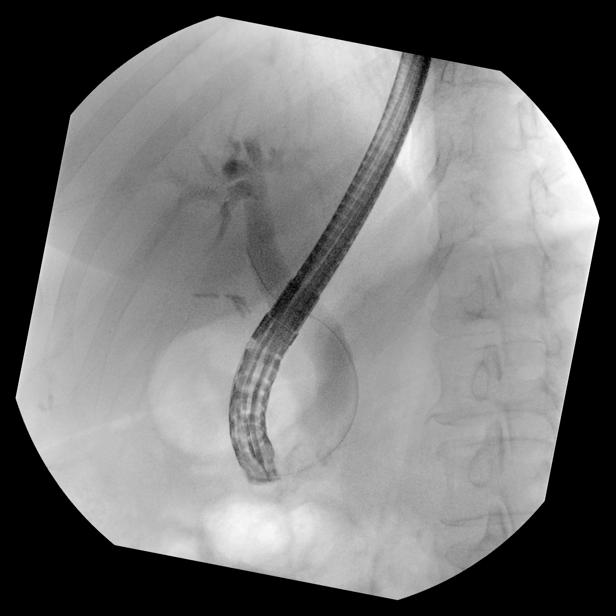
[im 3/7]
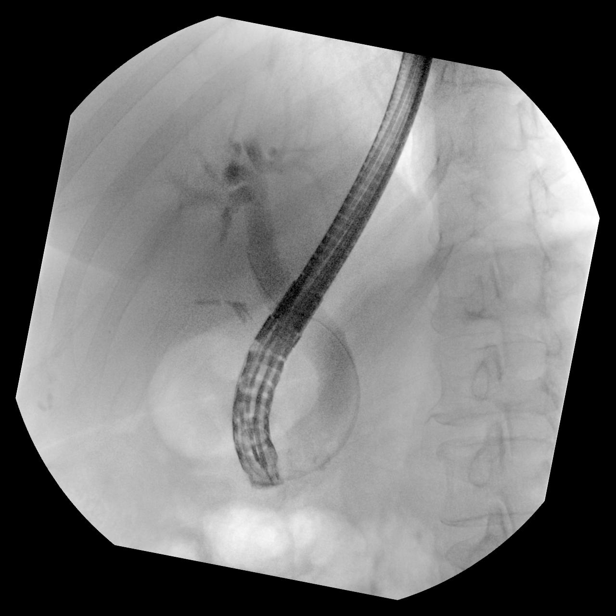
[im 4/7]
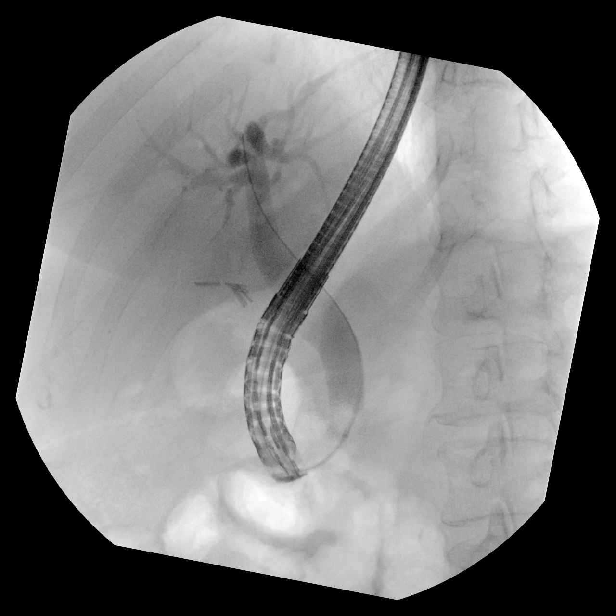
[im 4/7]
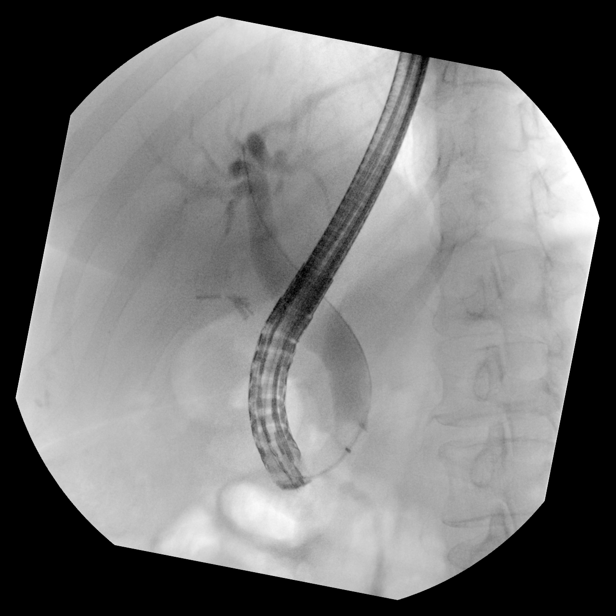
[im 5/7]
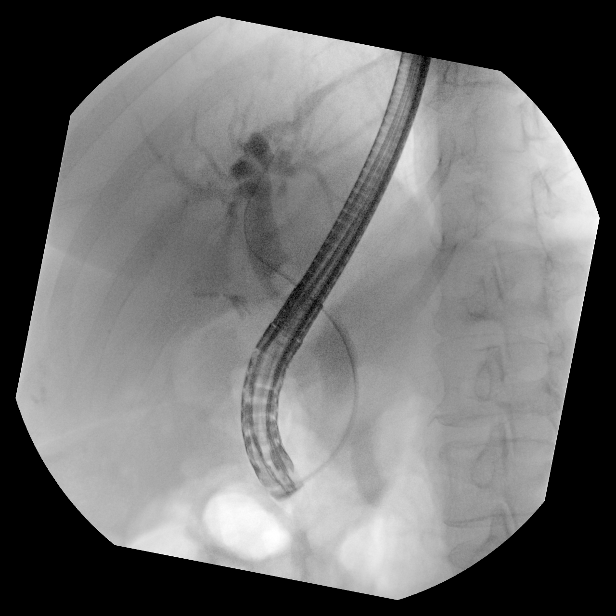
[im 5/7]
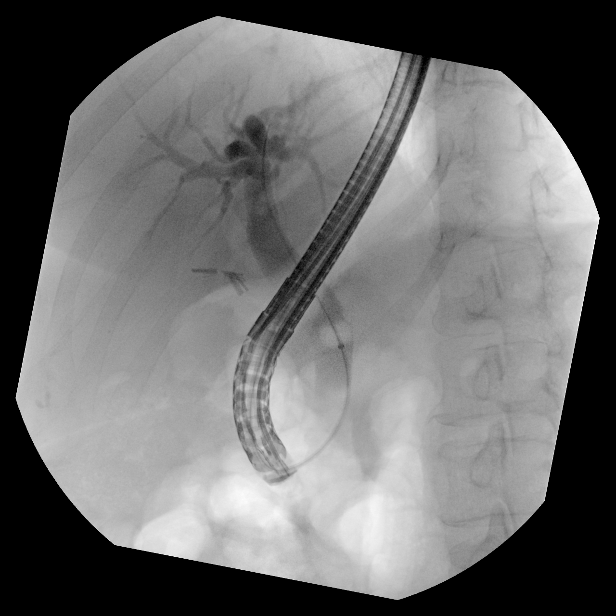
[im 5/7]
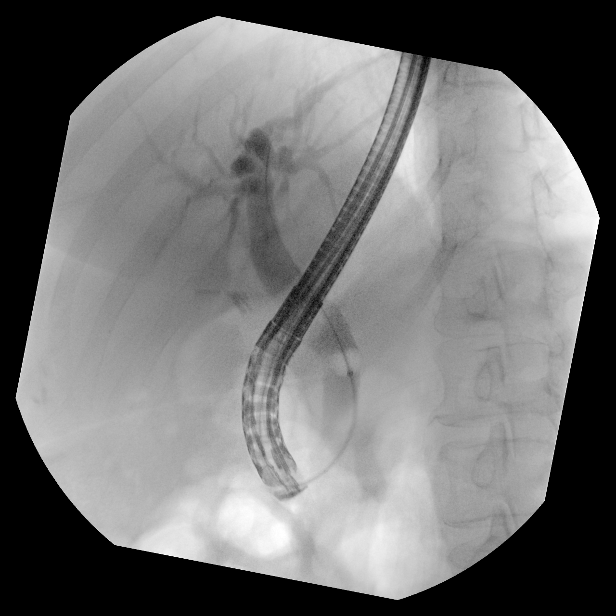
[im 6/7]
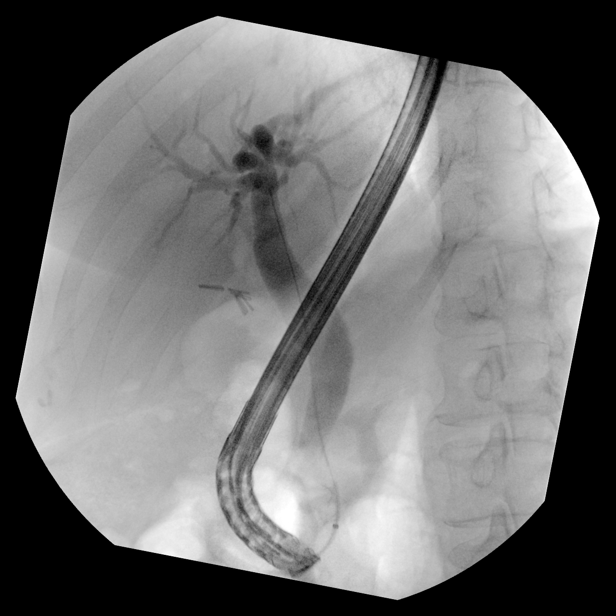
[im 7/7]
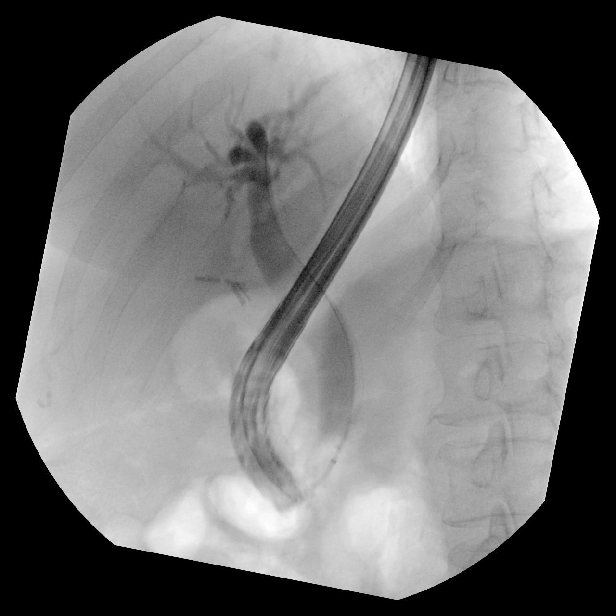
[im 7/7]
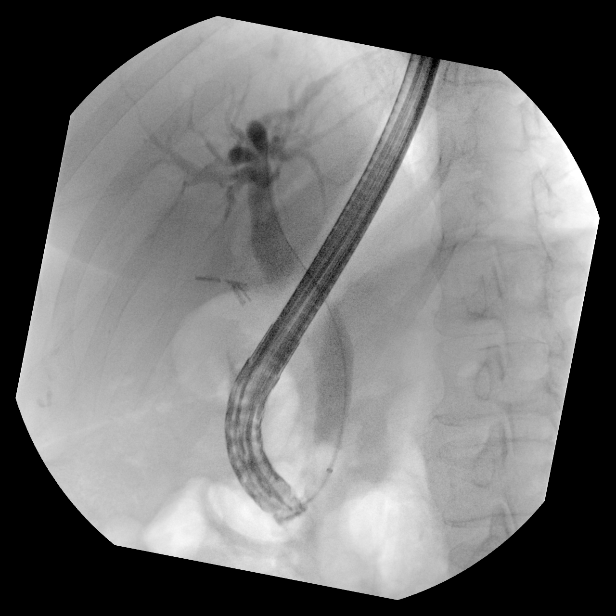
[im 7/7]
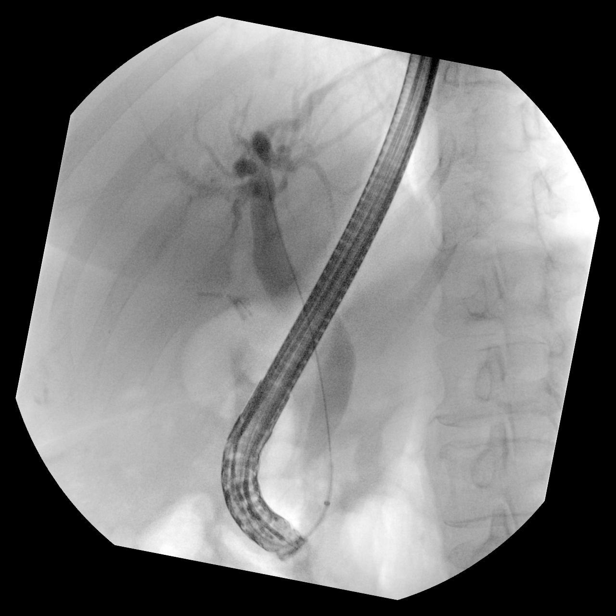

[15 of 24 positions shown; findings below may reference images not displayed]

FINDINGS: Retrograde cholangiogram demonstrates moderate dilatation of the
intrahepatic and extrahepatic biliary system. Findings are similar
to the previous exam. No definite filling defects or large stones.
IMPRESSION: Dilated biliary system without definite filling defects or stones.

These images were submitted for radiologic interpretation only.
Please see the procedural report for the amount of contrast and the
fluoroscopy time utilized.
